# Patient Record
Sex: Female | Born: 1955
Health system: Southern US, Community
[De-identification: ages and names within clinical notes are randomized; demographics above are authoritative.]

## PROBLEM LIST (undated history)

## (undated) DIAGNOSIS — Z9889 Other specified postprocedural states: Secondary | ICD-10-CM

## (undated) DIAGNOSIS — F32A Depression, unspecified: Secondary | ICD-10-CM

## (undated) DIAGNOSIS — F419 Anxiety disorder, unspecified: Secondary | ICD-10-CM

## (undated) DIAGNOSIS — K219 Gastro-esophageal reflux disease without esophagitis: Secondary | ICD-10-CM

## (undated) DIAGNOSIS — M1990 Unspecified osteoarthritis, unspecified site: Secondary | ICD-10-CM

## (undated) DIAGNOSIS — R42 Dizziness and giddiness: Secondary | ICD-10-CM

## (undated) DIAGNOSIS — I10 Essential (primary) hypertension: Secondary | ICD-10-CM

## (undated) DIAGNOSIS — M1712 Unilateral primary osteoarthritis, left knee: Secondary | ICD-10-CM

## (undated) DIAGNOSIS — F321 Major depressive disorder, single episode, moderate: Secondary | ICD-10-CM

## (undated) DIAGNOSIS — F329 Major depressive disorder, single episode, unspecified: Secondary | ICD-10-CM

## (undated) DIAGNOSIS — M7731 Calcaneal spur, right foot: Secondary | ICD-10-CM

## (undated) DIAGNOSIS — R7303 Prediabetes: Secondary | ICD-10-CM

## (undated) DIAGNOSIS — E039 Hypothyroidism, unspecified: Secondary | ICD-10-CM

## (undated) DIAGNOSIS — E559 Vitamin D deficiency, unspecified: Secondary | ICD-10-CM

## (undated) DIAGNOSIS — E785 Hyperlipidemia, unspecified: Secondary | ICD-10-CM

## (undated) HISTORY — DX: Vitamin D deficiency, unspecified: E55.9

## (undated) HISTORY — PX: ABDOMINAL HYSTERECTOMY: SHX81

## (undated) HISTORY — DX: Depression, unspecified: F32.A

## (undated) HISTORY — PX: BLADDER SURGERY: SHX569

## (undated) HISTORY — PX: CHOLECYSTECTOMY: SHX55

## (undated) HISTORY — DX: Anxiety disorder, unspecified: F41.9

## (undated) HISTORY — PX: KNEE SURGERY: SHX244

## (undated) HISTORY — PX: APPENDECTOMY: SHX54

## (undated) HISTORY — DX: Major depressive disorder, single episode, unspecified: F32.9

## (undated) HISTORY — DX: Hyperlipidemia, unspecified: E78.5

## (undated) HISTORY — PX: TONSILLECTOMY: SHX5217

## (undated) HISTORY — PX: CARPAL TUNNEL RELEASE: SHX101

---

## 2002-04-21 DIAGNOSIS — E039 Hypothyroidism, unspecified: Secondary | ICD-10-CM | POA: Insufficient documentation

## 2002-06-02 DIAGNOSIS — K219 Gastro-esophageal reflux disease without esophagitis: Secondary | ICD-10-CM | POA: Insufficient documentation

## 2003-01-11 DIAGNOSIS — E7800 Pure hypercholesterolemia, unspecified: Secondary | ICD-10-CM | POA: Insufficient documentation

## 2004-07-22 ENCOUNTER — Ambulatory Visit: Payer: Self-pay

## 2004-07-22 DIAGNOSIS — M899 Disorder of bone, unspecified: Secondary | ICD-10-CM | POA: Insufficient documentation

## 2004-09-11 ENCOUNTER — Observation Stay: Payer: Self-pay | Admitting: Internal Medicine

## 2005-02-10 ENCOUNTER — Emergency Department: Payer: Self-pay | Admitting: Emergency Medicine

## 2005-04-09 ENCOUNTER — Ambulatory Visit: Payer: Self-pay

## 2005-11-27 ENCOUNTER — Ambulatory Visit: Payer: Self-pay

## 2007-06-22 ENCOUNTER — Other Ambulatory Visit: Payer: Self-pay

## 2007-06-22 ENCOUNTER — Emergency Department: Payer: Self-pay | Admitting: Emergency Medicine

## 2007-08-15 ENCOUNTER — Ambulatory Visit: Payer: Self-pay | Admitting: General Surgery

## 2007-12-08 ENCOUNTER — Ambulatory Visit: Payer: Self-pay

## 2008-06-15 HISTORY — PX: RECONSTRUCTION OF NOSE: SHX2301

## 2008-07-12 DIAGNOSIS — Z90710 Acquired absence of both cervix and uterus: Secondary | ICD-10-CM | POA: Insufficient documentation

## 2008-12-22 DIAGNOSIS — R1013 Epigastric pain: Secondary | ICD-10-CM | POA: Insufficient documentation

## 2008-12-22 DIAGNOSIS — IMO0001 Reserved for inherently not codable concepts without codable children: Secondary | ICD-10-CM | POA: Insufficient documentation

## 2008-12-27 ENCOUNTER — Ambulatory Visit: Payer: Self-pay | Admitting: Family Medicine

## 2009-01-01 ENCOUNTER — Ambulatory Visit: Payer: Self-pay | Admitting: Family Medicine

## 2009-03-22 DIAGNOSIS — S40029A Contusion of unspecified upper arm, initial encounter: Secondary | ICD-10-CM | POA: Insufficient documentation

## 2009-07-14 ENCOUNTER — Ambulatory Visit: Payer: Self-pay | Admitting: Otolaryngology

## 2009-08-01 DIAGNOSIS — J309 Allergic rhinitis, unspecified: Secondary | ICD-10-CM | POA: Insufficient documentation

## 2009-08-27 DIAGNOSIS — L989 Disorder of the skin and subcutaneous tissue, unspecified: Secondary | ICD-10-CM | POA: Insufficient documentation

## 2010-09-29 ENCOUNTER — Ambulatory Visit: Payer: Self-pay

## 2010-10-01 ENCOUNTER — Ambulatory Visit: Payer: Self-pay | Admitting: Family Medicine

## 2011-04-13 ENCOUNTER — Ambulatory Visit: Payer: Self-pay | Admitting: Family Medicine

## 2011-10-12 ENCOUNTER — Ambulatory Visit: Payer: Self-pay | Admitting: Family Medicine

## 2012-09-28 ENCOUNTER — Ambulatory Visit: Payer: Self-pay | Admitting: Family Medicine

## 2013-04-10 ENCOUNTER — Ambulatory Visit: Payer: Self-pay | Admitting: Family Medicine

## 2014-01-22 LAB — CBC AND DIFFERENTIAL
HCT: 41 % (ref 36–46)
Hemoglobin: 13.8 g/dL (ref 12.0–16.0)
Platelets: 325 10*3/uL (ref 150–399)
WBC: 5.9 10^3/mL

## 2014-01-22 LAB — BASIC METABOLIC PANEL
BUN: 17 mg/dL (ref 4–21)
Creatinine: 0.6 mg/dL (ref 0.5–1.1)
Glucose: 93 mg/dL
Potassium: 4.8 mmol/L (ref 3.4–5.3)
Sodium: 140 mmol/L (ref 137–147)

## 2014-01-22 LAB — LIPID PANEL
Cholesterol: 168 mg/dL (ref 0–200)
HDL: 56 mg/dL (ref 35–70)
LDL Cholesterol: 95 mg/dL
Triglycerides: 84 mg/dL (ref 40–160)

## 2014-01-22 LAB — HEPATIC FUNCTION PANEL
ALT: 12 U/L (ref 7–35)
AST: 13 U/L (ref 13–35)

## 2014-03-30 LAB — TSH: TSH: 0.46 u[IU]/mL (ref 0.41–5.90)

## 2015-01-03 ENCOUNTER — Other Ambulatory Visit: Payer: Self-pay

## 2015-01-03 DIAGNOSIS — E559 Vitamin D deficiency, unspecified: Secondary | ICD-10-CM | POA: Insufficient documentation

## 2015-01-03 MED ORDER — VITAMIN D (ERGOCALCIFEROL) 1.25 MG (50000 UNIT) PO CAPS: 50000.0000 [IU] | ORAL_CAPSULE | ORAL | Status: DC

## 2015-02-19 ENCOUNTER — Other Ambulatory Visit: Payer: Self-pay | Admitting: Family Medicine

## 2015-02-19 ENCOUNTER — Other Ambulatory Visit: Payer: Self-pay

## 2015-02-19 DIAGNOSIS — Z780 Asymptomatic menopausal state: Secondary | ICD-10-CM

## 2015-02-19 MED ORDER — ESTRADIOL 1 MG PO TABS: 1.0000 mg | ORAL_TABLET | Freq: Every day | ORAL | Status: DC

## 2015-02-19 MED ORDER — LEVOTHYROXINE SODIUM 150 MCG PO TABS: 150.0000 ug | ORAL_TABLET | Freq: Every day | ORAL | Status: DC

## 2015-02-19 NOTE — Telephone Encounter (Signed)
Has an appointment in October with Tawanna Sat.  Thanks,   -Mickel Baas

## 2015-02-19 NOTE — Telephone Encounter (Signed)
Pt contacted office for refill request on the following medications: Estrace 1 mg to [facility] in [place]. Pt is scheduled with Tawanna Sat for her CPE in October. Pt was a Debbie pt.  Thanks TNP

## 2015-04-02 DIAGNOSIS — F321 Major depressive disorder, single episode, moderate: Secondary | ICD-10-CM | POA: Insufficient documentation

## 2015-04-02 DIAGNOSIS — F329 Major depressive disorder, single episode, unspecified: Secondary | ICD-10-CM | POA: Insufficient documentation

## 2015-04-02 DIAGNOSIS — N6009 Solitary cyst of unspecified breast: Secondary | ICD-10-CM | POA: Insufficient documentation

## 2015-04-02 DIAGNOSIS — F32A Depression, unspecified: Secondary | ICD-10-CM | POA: Insufficient documentation

## 2015-04-02 DIAGNOSIS — F419 Anxiety disorder, unspecified: Secondary | ICD-10-CM | POA: Insufficient documentation

## 2015-04-02 DIAGNOSIS — G4700 Insomnia, unspecified: Secondary | ICD-10-CM | POA: Insufficient documentation

## 2015-04-02 DIAGNOSIS — M533 Sacrococcygeal disorders, not elsewhere classified: Secondary | ICD-10-CM | POA: Insufficient documentation

## 2015-04-02 DIAGNOSIS — R002 Palpitations: Secondary | ICD-10-CM | POA: Insufficient documentation

## 2015-04-05 ENCOUNTER — Telehealth: Payer: Self-pay

## 2015-04-05 ENCOUNTER — Ambulatory Visit
Admission: RE | Admit: 2015-04-05 | Discharge: 2015-04-05 | Disposition: A | Discharge status: Home / Self Care | Payer: BLUE CROSS/BLUE SHIELD | Source: Ambulatory Visit | Attending: Physician Assistant | Admitting: Physician Assistant

## 2015-04-05 ENCOUNTER — Encounter: Payer: Self-pay | Admitting: Physician Assistant

## 2015-04-05 ENCOUNTER — Ambulatory Visit (INDEPENDENT_AMBULATORY_CARE_PROVIDER_SITE_OTHER): Payer: BLUE CROSS/BLUE SHIELD | Admitting: Physician Assistant

## 2015-04-05 VITALS — BP 120/78 | HR 64 | Temp 97.6°F | Resp 16 | Ht 59.0 in | Wt 201.2 lb

## 2015-04-05 DIAGNOSIS — Z1239 Encounter for other screening for malignant neoplasm of breast: Secondary | ICD-10-CM

## 2015-04-05 DIAGNOSIS — E559 Vitamin D deficiency, unspecified: Secondary | ICD-10-CM | POA: Diagnosis not present

## 2015-04-05 DIAGNOSIS — Z23 Encounter for immunization: Secondary | ICD-10-CM

## 2015-04-05 DIAGNOSIS — F419 Anxiety disorder, unspecified: Secondary | ICD-10-CM

## 2015-04-05 DIAGNOSIS — M25532 Pain in left wrist: Secondary | ICD-10-CM

## 2015-04-05 DIAGNOSIS — Z0000 Encounter for general adult medical examination without abnormal findings: Secondary | ICD-10-CM

## 2015-04-05 DIAGNOSIS — R635 Abnormal weight gain: Secondary | ICD-10-CM | POA: Diagnosis not present

## 2015-04-05 DIAGNOSIS — E7800 Pure hypercholesterolemia, unspecified: Secondary | ICD-10-CM

## 2015-04-05 DIAGNOSIS — F329 Major depressive disorder, single episode, unspecified: Secondary | ICD-10-CM

## 2015-04-05 DIAGNOSIS — E039 Hypothyroidism, unspecified: Secondary | ICD-10-CM | POA: Diagnosis not present

## 2015-04-05 DIAGNOSIS — Z780 Asymptomatic menopausal state: Secondary | ICD-10-CM

## 2015-04-05 DIAGNOSIS — Z8719 Personal history of other diseases of the digestive system: Secondary | ICD-10-CM | POA: Insufficient documentation

## 2015-04-05 DIAGNOSIS — F32A Depression, unspecified: Secondary | ICD-10-CM

## 2015-04-05 MED ORDER — VITAMIN D (ERGOCALCIFEROL) 1.25 MG (50000 UNIT) PO CAPS: 50000.0000 [IU] | ORAL_CAPSULE | ORAL | Status: DC

## 2015-04-05 MED ORDER — ALPRAZOLAM 0.5 MG PO TABS: 0.5000 mg | ORAL_TABLET | Freq: Two times a day (BID) | ORAL | Status: DC | PRN

## 2015-04-05 MED ORDER — ESTRADIOL 1 MG PO TABS: 1.0000 mg | ORAL_TABLET | Freq: Every day | ORAL | Status: DC

## 2015-04-05 MED ORDER — LEVOTHYROXINE SODIUM 150 MCG PO TABS: 150.0000 ug | ORAL_TABLET | Freq: Every day | ORAL | Status: DC

## 2015-04-05 NOTE — Progress Notes (Signed)
Patient: Tammy Bailey, Female    DOB: [birth year], 59 y.o.   MRN: [id number] Visit Date: 04/05/2015  Today's Provider: Mar Daring, PA-C   Chief Complaint  Patient presents with  . Annual Exam   Subjective:    Annual physical exam Tammy Bailey is a 59 y.o. female who presents today for health maintenance and complete physical. She feels well. She reports not exercising. She reports she is sleeping poorly, sleeps 6-7 hours but wakes 2-3 times at night.  She is status post hysterectomy approximately 30-40 years ago. She states that she had her uterus removed initially when she was in her late 34s secondary to endometriosis, then a year later she had one ovary removed in the year following that she had the second ovary removed. She has recently had a Pap smear in 2013 which was normal. Pelvic exam last year was normal with the exception of a cystocele. She states that she did have her bladder tacked a long time ago, she can't remember exactly when it was. She does report having some urinary stress incontinence and some urinary leakage when she has to use the restroom.  Most recent mammogram she cannot remember when it was but states that it was normal. Per reviewing records and all scripts her mammogram last documented was 04/10/2013 and reported normal. There is no family history of breast cancer.  She states she has had one colonoscopy but cannot remember the year. She states it was completely normal and was told to repeat in 10 years. Per review of records and all scripts her last colonoscopy on file was 08/15/2007. This was done by Dr. Jamal Collin.  She is still currently on hormone replacement therapy with estradiol 1 mg. This is controlling her postmenopausal symptoms. She has been on this for a while. As stated above she has underwent bilateral oophorectomy when she was in her 58s.  She also would like to have her lipids checked today. She does have history of having high  cholesterol. She is not currently taking anything for her cholesterol. She does not adhere to a healthy diet nor is she exercising at this time.  She also states that her depression and anxiety have been increasing recently. She equates this to recent weight gain. She states that she is a heavy she has ever been and is interested in weight loss. At this time she is not adhering to a healthy diet nor a calorie restricted diet. She is also not doing any physical activity. She was to have her thyroid level checked to make sure that her dose of levothyroxine is appropriate and that it is not a cause of her weight gain. She is not currently on any medication for her depression. She does take Xanax as needed for her anxiety.  She also complains today of left wrist pain. She states that over a year ago she was helping her sister move a couch and the couch fell and twisted her wrist. She states that she didn't do any treatment for it nor did she seek medical attention. Since then she has had increasing pain in the wrist and with certain movements including radial deviation she feels a burning sensation that radiates up the forearm. She would like to make sure that she did not have a fracture back then or to make sure that there is nothing else going on. She is not currently taking any medication for this. She does state she will occasionally take  BC powder when it is bothering her.   Review of Systems  Constitutional: Positive for fatigue and unexpected weight change (weight gain). Negative for fever and chills.       Irritability  HENT: Negative for congestion, ear pain, postnasal drip, rhinorrhea, sinus pressure, sneezing and sore throat.   Eyes: Positive for redness (right eye allergies) and itching (right eye allergies).  Respiratory: Positive for chest tightness and shortness of breath. Negative for cough, choking and wheezing.   Cardiovascular: Positive for palpitations (chronic; when anxiety kicks in).  Negative for chest pain and leg swelling.  Gastrointestinal: Negative.   Endocrine: Negative.   Genitourinary: Positive for enuresis (stress). Negative for dysuria, urgency, frequency, hematuria, flank pain, decreased urine volume, vaginal bleeding, vaginal discharge, difficulty urinating, vaginal pain and pelvic pain.  Musculoskeletal: Positive for arthralgias (left wrist). Negative for myalgias, back pain, joint swelling, gait problem, neck pain and neck stiffness.  Skin: Negative.   Allergic/Immunologic: Negative.   Neurological: Positive for dizziness and headaches. Negative for tremors, syncope, weakness and numbness.  Hematological: Negative.   Psychiatric/Behavioral: Positive for sleep disturbance and decreased concentration. The patient is nervous/anxious.     Social History She  reports that she has never smoked. She does not have any smokeless tobacco history on file. She reports that she does not drink alcohol or use illicit drugs. Social History   Social History  . Marital Status: Divorced    Spouse Name: N/A  . Number of Children: N/A  . Years of Education: N/A   Social History Main Topics  . Smoking status: Never Smoker   . Smokeless tobacco: None  . Alcohol Use: No  . Drug Use: No  . Sexual Activity: Not Asked   Other Topics Concern  . None   Social History Narrative    Patient Active Problem List   Diagnosis Date Noted  . History of esophageal stricture 04/05/2015  . Anxiety 04/02/2015  . Solitary cyst of breast 04/02/2015  . Clinical depression 04/02/2015  . Cannot sleep 04/02/2015  . Depression, major, single episode, moderate ([place]) 04/02/2015  . Awareness of heartbeats 04/02/2015  . Post-menopausal 02/19/2015  . Vitamin D deficiency 01/03/2015  . Dermatologic disease 08/27/2009  . Allergic rhinitis 08/01/2009  . H/O total hysterectomy 07/12/2008  . Hypercholesterolemia without hypertriglyceridemia 01/11/2003  . Acid reflux 06/02/2002  . Adult  hypothyroidism 04/21/2002    Past Surgical History  Procedure Laterality Date  . Bladder surgery    . Appendectomy    . Cholecystectomy    . Abdominal hysterectomy    . Tonsillectomy    . Carpal tunnel release      Both  . Knee surgery Left x's two    Family History  Family Status  Relation Status Death Age  . Mother Alive   . Father Alive     MI  . Sister Alive   . Brother Alive   . Paternal Grandmother Deceased 25    Coronary artery disease; lymphoma  . Sister Alive    Her family history includes Diabetes in her sister; Hypertension in her father.    Allergies  Allergen Reactions  . Erythromycin     Previous Medications   No medications on file    Patient Care Team: Mar Daring, PA-C as PCP - General (Family Medicine)     Objective:   Vitals: BP 120/78 mmHg  Pulse 64  Temp(Src) 97.6 F (36.4 C) (Oral)  Resp 16  Ht 4\' 11"  (1.499 m)  Wt  201 lb 3.2 oz (91.264 kg)  BMI 40.62 kg/m2   Physical Exam  Constitutional: She is oriented to person, place, and time. She appears well-developed and well-nourished. No distress.  HENT:  Head: Normocephalic and atraumatic.  Right Ear: Hearing, tympanic membrane, external ear and ear canal normal.  Left Ear: Hearing, tympanic membrane, external ear and ear canal normal.  Nose: Nose normal.  Mouth/Throat: Uvula is midline, oropharynx is clear and moist and mucous membranes are normal. No oropharyngeal exudate.  Eyes: EOM are normal. Pupils are equal, round, and reactive to light. Right eye exhibits no discharge. Left eye exhibits no discharge. Right conjunctiva is injected (with itching; most likely secondary to allergies). Right conjunctiva has no hemorrhage. Left conjunctiva is not injected. Left conjunctiva has no hemorrhage. No scleral icterus.  Neck: Trachea normal and normal range of motion. Neck supple. No JVD present. Carotid bruit is not present. No tracheal deviation present. No thyroid mass and no  thyromegaly present.  Cardiovascular: Normal rate, regular rhythm, normal heart sounds and intact distal pulses.  Exam reveals no gallop and no friction rub.   No murmur heard. Pulmonary/Chest: Effort normal and breath sounds normal. No respiratory distress. She has no wheezes. She has no rales. She exhibits no tenderness. Right breast exhibits no inverted nipple, no mass, no nipple discharge, no skin change and no tenderness. Left breast exhibits no inverted nipple, no mass, no nipple discharge, no skin change and no tenderness. Breasts are symmetrical.  Abdominal: Soft. Bowel sounds are normal. She exhibits no distension and no mass. There is no tenderness. There is no rebound and no guarding. Hernia confirmed negative in the right inguinal area and confirmed negative in the left inguinal area.  Genitourinary: Rectum normal and vagina normal. No breast swelling, tenderness, discharge or bleeding. Pelvic exam was performed with patient supine. There is no rash, tenderness, lesion or injury on the right labia. There is no rash, tenderness, lesion or injury on the left labia. No erythema, tenderness or bleeding in the vagina. No foreign body around the vagina. No signs of injury around the vagina. No vaginal discharge found.  Uterus and ovaries surgically removed.  Small cystocele and rectocele on exam.  Musculoskeletal: Normal range of motion. She exhibits no edema or tenderness.  Lymphadenopathy:    She has no cervical adenopathy.       Right: No inguinal adenopathy present.       Left: No inguinal adenopathy present.  Neurological: She is alert and oriented to person, place, and time. She has normal reflexes. No cranial nerve deficit. Coordination normal.  Skin: Skin is warm and dry. No rash noted. She is not diaphoretic.  Psychiatric: She has a normal mood and affect. Her behavior is normal. Judgment and thought content normal.  Vitals reviewed.    Depression Screen No flowsheet data  found.    Assessment & Plan:     Routine Health Maintenance and Physical Exam  1. Annual physical exam Exam was normal today. We'll check labs and follow-up pending lab results. I will also see her back in 4 weeks to further discuss some of the acute issues that she brought up today. - CBC with Differential - Comprehensive metabolic panel - Mammogram Digital Screening; Future  2. Breast cancer screening Breast exam today was normal. Last mammogram was 04/10/2013 and normal. Order placed for screening mammogram. [facility] phone number was given to patient so she may call and schedule this appointment. - Mammogram Digital Screening; Future  3. Need for influenza vaccination Flu vaccine was given today without complication. - Flu Vaccine QUAD 36+ mos IM  4. Hypercholesterolemia without hypertriglyceridemia History of this. She is not currently taking any cholesterol-lowering medication. She does not adhere to a healthy diet nor is she doing any physical activity. I will check a lipid panel today and follow-up pending results. - Lipid panel  5. Vitamin D deficiency History of this and has been stable on vitamin D supplement as below. Vitamin D was refilled and we will check this lab to make sure she still requires a high-dose replacement of vitamin D. I will follow-up with her pending lab results - Vitamin D (25 hydroxy) - Vitamin D, Ergocalciferol, (DRISDOL) 50000 UNITS CAPS capsule; Take 1 capsule (50,000 Units total) by mouth every 7 (seven) days.  Dispense: 4 capsule; Refill: 6  6. Post-menopausal Currently stable on Estrace 1 mg. Medication was pulled as below for refill. - estradiol (ESTRACE) 1 MG tablet; Take 1 tablet (1 mg total) by mouth daily.  Dispense: 30 tablet; Refill: 6  7. Hypothyroidism, unspecified hypothyroidism type Has been stable on Synthroid 150 g. I will check her thyroid level today. Follow-up pending lab results. - TSH - levothyroxine  (SYNTHROID, LEVOTHROID) 150 MCG tablet; Take 1 tablet (150 mcg total) by mouth daily.  Dispense: 30 tablet; Refill: 6  8. Acute anxiety She states this has been worsening recently. She equates the increased anxiety and depression due to weight gain. She has not been making any lifestyle changes at this time to help her lose weight. She does state she will try to eat salads and salmon but she has been putting high fat and high calorie items on the salad. She is going to try to make changes so that she can hopefully start trying to lose weight. I did refill her Xanax as below. I will follow-up with her in 4 weeks to see how she is doing. - ALPRAZolam (XANAX) 0.5 MG tablet; Take 1 tablet (0.5 mg total) by mouth 2 (two) times daily as needed for anxiety.  Dispense: 60 tablet; Refill: 1  9. Left wrist pain Left wrist pain started after helping her sister. Tammy Bailey which they dropped and she twisted her wrist. This was over a year ago. She did not seek medical attention at that time. Since the accident she has been having increased tenderness with certain motions of the wrist especially radial deviation. She states that when she makes those motions she has a burning fire type sensation that shoots up her forearm. She does not have any decreased grip strength at this time. She also has normal sensation. - DG Hand Complete Left; Future - DG Wrist Complete Left; Future  10. Clinical depression This has been worsening. As stated above with anxiety she feels that the depression is increasing due to her weight gain. She states she is heavy she is ever been. She is currently not on any medications for her depression. I will follow-up with her in 4 weeks to see how she is doing with adding diet and exercise to her daily routine.  11. Weight gain As stated above she feels that her weight gain has contributed to worsening depression and anxiety. She states she is the heaviest she has ever been. She has not really adhere  to a healthy diet. She states she has been trying to increase eating salads and salmon but she was putting high-calorie high-fat ingredients on the salad as well. I gave her some information today  about different diets that she may try and we also discussed in detail about limiting calories. She stated that she does not do well with counting and does not know if that would work for her not. I also advised her to increase her physical activity as currently she is doing nothing. If she is able to increase physical activity and start adhering to a stricter diet I will consider adding an appetite suppressant to help her with her weight loss journey. I will see her back in 4 weeks to see if she has been trying to add exercise and watch her diet better. If so we may consider phentermine at that time.   Exercise Activities and Dietary recommendations Goals    None      Immunization History  Administered Date(s) Administered  . Influenza,inj,Quad PF,36+ Mos 04/05/2015  . Td 04/03/2004  . Tdap 08/27/2009  . Zoster 05/19/2013    Health Maintenance  Topic Date Due  . Hepatitis C Screening  10/21/1955  . HIV Screening  08/28/1969  . PAP SMEAR  09/15/2014  . INFLUENZA VACCINE  01/14/2015  . MAMMOGRAM  04/11/2015  . COLONOSCOPY  08/14/2017  . TETANUS/TDAP  08/28/2019  . ZOSTAVAX  Completed      Discussed health benefits of physical activity, and encouraged her to engage in regular exercise appropriate for her age and condition.    --------------------------------------------------------------------

## 2015-04-05 NOTE — Telephone Encounter (Signed)
Patient advised as directed below.  Thanks,  -Baudelio Karnes 

## 2015-04-05 NOTE — Patient Instructions (Addendum)
Health Maintenance, Female Adopting a healthy lifestyle and getting preventive care can go a long way to promote health and wellness. Talk with your health care provider about what schedule of regular examinations is right for you. This is a good chance for you to check in with your provider about disease prevention and staying healthy. In between checkups, there are plenty of things you can do on your own. Experts have done a lot of research about which lifestyle changes and preventive measures are most likely to keep you healthy. Ask your health care provider for more information. WEIGHT AND DIET  Eat a healthy diet 1. Be sure to include plenty of vegetables, fruits, low-fat dairy products, and lean protein. 2. Do not eat a lot of foods high in solid fats, added sugars, or salt. 3. Get regular exercise. This is one of the most important things you can do for your health. 1. Most adults should exercise for at least 150 minutes each week. The exercise should increase your heart rate and make you sweat (moderate-intensity exercise). 2. Most adults should also do strengthening exercises at least twice a week. This is in addition to the moderate-intensity exercise.  Maintain a healthy weight 1. Body mass index (BMI) is a measurement that can be used to identify possible weight problems. It estimates body fat based on height and weight. Your health care provider can help determine your BMI and help you achieve or maintain a healthy weight. 2. For females 60 years of age and older:  1. A BMI below 18.5 is considered underweight. 2. A BMI of 18.5 to 24.9 is normal. 3. A BMI of 25 to 29.9 is considered overweight. 4. A BMI of 30 and above is considered obese.  Watch levels of cholesterol and blood lipids 1. You should start having your blood tested for lipids and cholesterol at 60 years of age, then have this test every 5 years. 2. You may need to have your cholesterol levels checked more often  if: 1. Your lipid or cholesterol levels are high. 2. You are older than 60 years of age. 3. You are at high risk for heart disease. your cholesterol levels checked more often  if: 1. Your lipid or cholesterol levels are high. 2. You are older than 60 years of age. 3. You are at high risk for heart disease.  CANCER SCREENING   Lung Cancer 1. Lung cancer screening is recommended for adults 60-65 years old who are at high risk for lung cancer because of a history of smoking. 2. A yearly low-dose CT scan of the lungs is recommended for people who: 1. Currently smoke. 2. Have quit within the past 15 years. 3. Have at least a 30-pack-year history of smoking. A pack year is smoking an average of one pack of cigarettes a day for 1 year. 3. Yearly screening should continue until it has been 15 years since you quit. 4. Yearly screening should stop if you develop a health problem that would prevent you from having lung cancer treatment.  Breast Cancer  Practice breast self-awareness. This means understanding how your breasts normally appear and feel.  It also means doing regular breast self-exams. Let your health care provider know about any changes, no matter how small.  If you are in your 20s or 30s, you should have a clinical breast exam (CBE) by a health care provider every 1-3 years as part of a regular health exam.  If you are 60 or older, have a CBE every year. Also consider having a breast X-ray (mammogram) every year.  If you have a family history of breast cancer, talk to your health care provider about genetic screening.  If you  are at high risk for breast cancer, talk to your health care provider about having an MRI and a mammogram every year.  Breast cancer gene (BRCA) assessment is recommended for women who have family members with BRCA-related cancers. BRCA-related cancers include:  Breast.  Ovarian.  Tubal.  Peritoneal cancers.  Results of the assessment will determine the need for genetic counseling and BRCA1 and BRCA2 testing. Cervical Cancer Your health care provider may recommend that you be screened regularly for cancer of the pelvic  organs (ovaries, uterus, and vagina). This screening involves a pelvic examination, including checking for microscopic changes to the surface of your cervix (Pap test). You may be encouraged to have this screening done every 3 years, beginning at age 60.  For women ages 60-65, health care providers may recommend pelvic exams and Pap testing every 3 years, or they may recommend the Pap and pelvic exam, combined with testing for human papilloma virus (HPV), every 5 years. Some types of HPV increase your risk of cervical cancer. Testing for HPV may also be done on women of any age with unclear Pap test results.  Other health care providers may not recommend any screening for nonpregnant women who are considered low risk for pelvic cancer and who do not have symptoms. Ask your health care provider if a screening pelvic exam is right for you.  If you have had past treatment for cervical cancer or a condition that could lead to cancer, you need Pap tests and screening for cancer for at least 60 years after your treatment. or a condition that could lead to cancer, you need Pap tests and screening for cancer for at least 20 years after your treatment. If Pap tests have been discontinued, your risk factors (such as having a new sexual partner) need to be reassessed to determine if screening should resume. Some women have medical problems that increase the chance of getting cervical cancer. In these cases, your health care provider may recommend more frequent screening and Pap tests. Colorectal Cancer  This type of cancer can be detected and often prevented.  Routine colorectal cancer screening usually begins at 60 years of age and continues through 60 years of age.  Your health care provider may recommend screening at an earlier age if you have risk factors for colon cancer.  Your health care provider may also recommend using home test kits to check for hidden blood in the stool.  A small camera at the end of a tube can be used to examine your colon directly (sigmoidoscopy or colonoscopy). This is done to check for the earliest forms  of colorectal cancer.  Routine screening usually begins at age 60.  Direct examination of the colon should be repeated every 5-10 years through 60 years of age. However, you may need to be screened more often if early forms of precancerous polyps or small growths are found. Skin Cancer  Check your skin from head to toe regularly.  Tell your health care provider about any new moles or changes in moles, especially if there is a change in a mole's shape or color.  Also tell your health care provider if you have a mole that is larger than the size of a pencil eraser.  Always use sunscreen. Apply sunscreen liberally and repeatedly throughout the day.  Protect yourself by wearing long sleeves, pants, a wide-brimmed hat, and sunglasses whenever you are outside. HEART DISEASE, DIABETES, AND HIGH BLOOD PRESSURE   High blood pressure causes heart disease and increases the risk of stroke. High blood pressure is more likely to develop in:  People who have blood pressure in the high end  of the normal range (130-139/85-89 mm Hg).  People who are overweight or obese.  People who are African American.  If you are 85-42 years of age, have your blood pressure checked every 3-5 years. If you are 20 years of age or older, have your blood pressure checked every year. You should have your blood pressure measured twice--once when you are at a hospital or clinic, and once when you are not at a hospital or clinic. Record the average of the two measurements. To check your blood pressure when you are not at a hospital or clinic, you can use:  An automated blood pressure machine at a pharmacy.  A home blood pressure monitor.  If you are between 19 years and 53 years old, ask your health care provider if you should take aspirin to prevent strokes.  Have regular diabetes screenings. This involves taking a blood sample to check your fasting blood sugar level.  If you are at a normal weight and have a low risk  for diabetes, have this test once every three years after 61 years of age.  If you are overweight and have a high risk for diabetes, consider being tested at a younger age or more often. PREVENTING INFECTION  Hepatitis B  If you have a higher risk for hepatitis B, you should be screened for this virus. You are considered at high risk for hepatitis B if:  You were born in a country where hepatitis B is common. Ask your health care provider which countries are considered high risk.  Your parents were born in a high-risk country, and you have not been immunized against hepatitis B (hepatitis B vaccine).  You have HIV or AIDS.  You use needles to inject street drugs.  You live with someone who has hepatitis B.  You have had sex with someone who has hepatitis B.  You get hemodialysis treatment.  You take certain medicines for conditions, including cancer, organ transplantation, and autoimmune conditions. Hepatitis C  Blood testing is recommended for:  Everyone born from [birth year] through 1966.  Anyone with known risk factors for hepatitis C. Sexually transmitted infections (STIs)  You should be screened for sexually transmitted infections (STIs) including gonorrhea and chlamydia if:  You are sexually active and are younger than 60 years of age.  You are older than 61 years of age and your health care provider tells you that you are at risk for this type of infection.  Your sexual activity has changed since you were last screened and you are at an increased risk for chlamydia or gonorrhea. Ask your health care provider if you are at risk.  If you do not have HIV, but are at risk, it may be recommended that you take a prescription medicine daily to prevent HIV infection. This is called pre-exposure prophylaxis (PrEP). You are considered at risk if:  You are sexually active and do not regularly use condoms or know the HIV status of your partner(s).  You take drugs by injection.  You  are sexually active with a partner who has HIV. Talk with your health care provider about whether you are at high risk of being infected with HIV. If you choose to begin PrEP, you should first be tested for HIV. You should then be tested every 3 months for as long as you are taking PrEP.  PREGNANCY   If you are premenopausal and you may become pregnant, ask your health care provider about preconception counseling.  If you may  become pregnant, take 400 to 800 micrograms (mcg) of folic acid every day.  If you want to prevent pregnancy, talk to your health care provider about birth control (contraception). OSTEOPOROSIS AND MENOPAUSE   Osteoporosis is a disease in which the bones lose minerals and strength with aging. This can result in serious bone fractures. Your risk for osteoporosis can be identified using a bone density scan.  If you are 29 years of age or older, or if you are at risk for osteoporosis and fractures, ask your health care provider if you should be screened.  Ask your health care provider whether you should take a calcium or vitamin D supplement to lower your risk for osteoporosis.  Menopause may have certain physical symptoms and risks.  Hormone replacement therapy may reduce some of these symptoms and risks. Talk to your health care provider about whether hormone replacement therapy is right for you.  HOME CARE INSTRUCTIONS   Schedule regular health, dental, and eye exams.  Stay current with your immunizations.   Do not use any tobacco products including cigarettes, chewing tobacco, or electronic cigarettes.  If you are pregnant, do not drink alcohol.  If you are breastfeeding, limit how much and how often you drink alcohol.  Limit alcohol intake to no more than 1 drink per day for nonpregnant women. One drink equals 12 ounces of beer, 5 ounces of wine, or 1 ounces of hard liquor.  Do not use street drugs.  Do not share needles.  Ask your health care provider  for help if you need support or information about quitting drugs.  Tell your health care provider if you often feel depressed.  Tell your health care provider if you have ever been abused or do not feel safe at home.   This information is not intended to replace advice given to you by your health care provider. Make sure you discuss any questions you have with your health care provider.   Document Released: 12/15/2010 Document Revised: 06/22/2014 Document Reviewed: 05/03/2013 Elsevier Interactive Patient Education 2016 [place] [facility]) Exercise Recommendation  Being physically active is important to prevent heart disease and stroke, the nation's No. 1and No. 5killers. To improve overall cardiovascular health, we suggest at least 150 minutes per week of moderate exercise or 75 minutes per week of vigorous exercise (or a combination of moderate and vigorous activity). Thirty minutes a day, five times a week is an easy goal to remember. You will also experience benefits even if you divide your time into two or three segments of 10 to 15 minutes per day.  For people who would benefit from lowering their blood pressure or cholesterol, we recommend 40 minutes of aerobic exercise of moderate to vigorous intensity three to four times a week to lower the risk for heart attack and stroke.  Physical activity is anything that makes you move your body and burn calories.  This includes things like climbing stairs or playing sports. Aerobic exercises benefit your heart, and include walking, jogging, swimming or biking. Strength and stretching exercises are best for overall stamina and flexibility.  The simplest, positive change you can make to effectively improve your heart health is to start walking. It's enjoyable, free, easy, social and great exercise. A walking program is flexible and boasts high success rates because people can stick with it. It's easy for walking to become  a regular and satisfying part of life.   For Overall Cardiovascular Health:  At least 30  minutes of moderate-intensity aerobic activity at least 5 days per week for a total of 150  OR   At least 25 minutes of vigorous aerobic activity at least 3 days per week for a total of 75 minutes; or a combination of moderate- and vigorous-intensity aerobic activity  AND   Moderate- to high-intensity muscle-strengthening activity at least 2 days per week for additional health benefits.  For Lowering Blood Pressure and Cholesterol  An average 40 minutes of moderate- to vigorous-intensity aerobic activity 3 or 4 times per week  What if I can't make it to the time goal? Something is always better than nothing! And everyone has to start somewhere. Even if you've been sedentary for years, today is the day you can begin to make healthy changes in your life. If you don't think you'll make it for 30 or 40 minutes, set a reachable goal for today. You can work up toward your overall goal by increasing your time as you get stronger. Don't let all-or-nothing thinking rob you of doing what you can every day.  Source:http://www.heart.org      Why follow it? Research shows. . Those who follow the Mediterranean diet have a reduced risk of heart disease  . The diet is associated with a reduced incidence of Parkinson's and Alzheimer's diseases . People following the diet may have longer life expectancies and lower rates of chronic diseases  . The Dietary Guidelines for Americans recommends the Mediterranean diet as an eating plan to promote health and prevent disease  What Is the Mediterranean Diet?  . Healthy eating plan based on typical foods and recipes of Mediterranean-style cooking . The diet is primarily a plant based diet; these foods should make up a majority of meals   Starches - Plant based foods should make up a majority of meals - They are an important sources of vitamins, minerals, energy,  antioxidants, and fiber - Choose whole grains, foods high in fiber and minimally processed items  - Typical grain sources include wheat, oats, barley, corn, brown rice, bulgar, farro, millet, polenta, couscous  - Various types of beans include chickpeas, lentils, fava beans, black beans, white beans   Fruits  Veggies - Large quantities of antioxidant rich fruits & veggies; 6 or more servings  - Vegetables can be eaten raw or lightly drizzled with oil and cooked  - Vegetables common to the traditional Mediterranean Diet include: artichokes, arugula, beets, broccoli, brussel sprouts, cabbage, carrots, celery, collard greens, cucumbers, eggplant, kale, leeks, lemons, lettuce, mushrooms, okra, onions, peas, peppers, potatoes, pumpkin, radishes, rutabaga, shallots, spinach, sweet potatoes, turnips, zucchini - Fruits common to the Mediterranean Diet include: apples, apricots, avocados, cherries, clementines, dates, figs, grapefruits, grapes, melons, nectarines, oranges, peaches, pears, pomegranates, strawberries, tangerines  Fats - Replace butter and margarine with healthy oils, such as olive oil, canola oil, and tahini  - Limit nuts to no more than a handful a day  - Nuts include walnuts, almonds, pecans, pistachios, pine nuts  - Limit or avoid candied, honey roasted or heavily salted nuts - Olives are central to the [employer] - can be eaten whole or used in a variety of dishes   Meats Protein - Limiting red meat: no more than a few times a month - When eating red meat: choose lean cuts and keep the portion to the size of deck of cards - Eggs: approx. 0 to 4 times a week  - Fish and lean poultry: at least 2 a week  - Healthy  protein sources include, chicken, [place], lean beef, lamb - Increase intake of seafood such as tuna, salmon, trout, mackerel, shrimp, scallops - Avoid or limit high fat processed meats such as sausage and bacon  Dairy - Include moderate amounts of low fat dairy products   - Focus on healthy dairy such as fat free yogurt, skim milk, low or reduced fat cheese - Limit dairy products higher in fat such as whole or 2% milk, cheese, ice cream  Alcohol - Moderate amounts of red wine is ok  - No more than 5 oz daily for women (all ages) and men older than age 85  - No more than 10 oz of wine daily for men younger than 28  Other - Limit sweets and other desserts  - Use herbs and spices instead of salt to flavor foods  - Herbs and spices common to the traditional Mediterranean Diet include: basil, bay leaves, chives, cloves, cumin, fennel, garlic, lavender, marjoram, mint, oregano, parsley, pepper, rosemary, sage, savory, sumac, tarragon, thyme   It's not just a diet, it's a lifestyle:  . The Mediterranean diet includes lifestyle factors typical of those in the region  . Foods, drinks and meals are best eaten with others and savored . Daily physical activity is important for overall good health . This could be strenuous exercise like running and aerobics . This could also be more leisurely activities such as walking, housework, yard-work, or taking the stairs . Moderation is the key; a balanced and healthy diet accommodates most foods and drinks . Consider portion sizes and frequency of consumption of certain foods   Meal Ideas & Options:  . Breakfast:  o Whole wheat toast or whole wheat English muffins with peanut butter & hard boiled egg o Steel cut oats topped with apples & cinnamon and skim milk  o Fresh fruit: banana, strawberries, melon, berries, peaches  o Smoothies: strawberries, bananas, greek yogurt, peanut butter o Low fat greek yogurt with blueberries and granola  o Egg white omelet with spinach and mushrooms o Breakfast couscous: whole wheat couscous, apricots, skim milk, cranberries  . Sandwiches:  o Hummus and grilled vegetables (peppers, zucchini, squash) on whole wheat bread   o Grilled chicken on whole wheat pita with lettuce, tomatoes,  cucumbers or tzatziki  o Tuna salad on whole wheat bread: tuna salad made with greek yogurt, olives, red peppers, capers, green onions o Garlic rosemary lamb pita: lamb sauted with garlic, rosemary, salt & pepper; add lettuce, cucumber, greek yogurt to pita - flavor with lemon juice and black pepper  . Seafood:  o Mediterranean grilled salmon, seasoned with garlic, basil, parsley, lemon juice and black pepper o Shrimp, lemon, and spinach whole-grain pasta salad made with low fat greek yogurt  o Seared scallops with lemon orzo  o Seared tuna steaks seasoned salt, pepper, coriander topped with tomato mixture of olives, tomatoes, olive oil, minced garlic, parsley, green onions and cappers  . Meats:  o Herbed greek chicken salad with kalamata olives, cucumber, feta  o Red bell peppers stuffed with spinach, bulgur, lean ground beef (or lentils) & topped with feta   o Kebabs: skewers of chicken, tomatoes, onions, zucchini, squash  o [place] burgers: made with red onions, mint, dill, lemon juice, feta cheese topped with roasted red peppers . Vegetarian o Cucumber salad: cucumbers, artichoke hearts, celery, red onion, feta cheese, tossed in olive oil & lemon juice  o Hummus and whole grain pita points with a greek salad (lettuce, tomato, feta,  olives, cucumbers, red onion) o Lentil soup with celery, carrots made with vegetable broth, garlic, salt and pepper  o Tabouli salad: parsley, bulgur, mint, scallions, cucumbers, tomato, radishes, lemon juice, olive oil, salt and pepper.

## 2015-04-05 NOTE — Telephone Encounter (Signed)
-----   Message from Mar Daring, [place] sent at 04/05/2015 11:36 AM EDT ----- Xrays show degenerative changes.  No fractures.  This is most likely arthritis that has advanced secondary to the injury over a year ago.  We can discuss in more detail at follow up treatment options for arthritis.  Most often, however, we treat as needed with anti-inflammatories such as aleve or ibuprofen.

## 2015-04-06 LAB — CBC WITH DIFFERENTIAL/PLATELET
Basophils Absolute: 0 10*3/uL (ref 0.0–0.2)
Basos: 1 %
EOS (ABSOLUTE): 0.2 10*3/uL (ref 0.0–0.4)
Eos: 3 %
Hematocrit: 41.9 % (ref 34.0–46.6)
Hemoglobin: 14.4 g/dL (ref 11.1–15.9)
Immature Grans (Abs): 0 10*3/uL (ref 0.0–0.1)
Immature Granulocytes: 0 %
Lymphocytes Absolute: 1.7 10*3/uL (ref 0.7–3.1)
Lymphs: 32 %
MCH: 30 pg (ref 26.6–33.0)
MCHC: 34.4 g/dL (ref 31.5–35.7)
MCV: 87 fL (ref 79–97)
Monocytes Absolute: 0.6 10*3/uL (ref 0.1–0.9)
Monocytes: 11 %
Neutrophils Absolute: 2.9 10*3/uL (ref 1.4–7.0)
Neutrophils: 53 %
Platelets: 337 10*3/uL (ref 150–379)
RBC: 4.8 x10E6/uL (ref 3.77–5.28)
RDW: 13.4 % (ref 12.3–15.4)
WBC: 5.3 10*3/uL (ref 3.4–10.8)

## 2015-04-06 LAB — COMPREHENSIVE METABOLIC PANEL
ALT: 12 IU/L (ref 0–32)
AST: 13 IU/L (ref 0–40)
Albumin/Globulin Ratio: 1.8 (ref 1.1–2.5)
Albumin: 4.4 g/dL (ref 3.6–4.8)
Alkaline Phosphatase: 99 IU/L (ref 39–117)
BUN/Creatinine Ratio: 22 (ref 11–26)
BUN: 14 mg/dL (ref 8–27)
Bilirubin Total: 0.2 mg/dL (ref 0.0–1.2)
CO2: 23 mmol/L (ref 18–29)
Calcium: 9.4 mg/dL (ref 8.7–10.3)
Chloride: 104 mmol/L (ref 97–106)
Creatinine, Ser: 0.65 mg/dL (ref 0.57–1.00)
GFR calc Af Amer: 112 mL/min/{1.73_m2} (ref >59)
GFR calc non Af Amer: 97 mL/min/{1.73_m2} (ref >59)
Globulin, Total: 2.5 g/dL (ref 1.5–4.5)
Glucose: 91 mg/dL (ref 65–99)
Potassium: 4.7 mmol/L (ref 3.5–5.2)
Sodium: 140 mmol/L (ref 136–144)
Total Protein: 6.9 g/dL (ref 6.0–8.5)

## 2015-04-06 LAB — LIPID PANEL
Chol/HDL Ratio: 2.8 ratio units (ref 0.0–4.4)
Cholesterol, Total: 197 mg/dL (ref 100–199)
HDL: 71 mg/dL (ref >39)
LDL Calculated: 111 mg/dL — ABNORMAL HIGH (ref 0–99)
Triglycerides: 77 mg/dL (ref 0–149)
VLDL Cholesterol Cal: 15 mg/dL (ref 5–40)

## 2015-04-06 LAB — VITAMIN D 25 HYDROXY (VIT D DEFICIENCY, FRACTURES): Vit D, 25-Hydroxy: 24.1 ng/mL — ABNORMAL LOW (ref 30.0–100.0)

## 2015-04-06 LAB — TSH: TSH: 1.92 u[IU]/mL (ref 0.450–4.500)

## 2015-04-09 ENCOUNTER — Telehealth: Payer: Self-pay

## 2015-04-09 NOTE — Telephone Encounter (Signed)
Patient advised as directed below.  Thanks,  -Rudie Rikard 

## 2015-04-09 NOTE — Telephone Encounter (Signed)
-----   Message from Mar Daring, [place] sent at 04/06/2015  9:20 AM EDT ----- All labs are normal and stable with exception of vitamin d, which is still low.  Continue vit D supplement 50,000 IU q weekly as prescribed.

## 2015-05-03 ENCOUNTER — Ambulatory Visit: Payer: BLUE CROSS/BLUE SHIELD | Admitting: Physician Assistant

## 2015-08-07 ENCOUNTER — Telehealth: Payer: Self-pay | Admitting: Physician Assistant

## 2015-08-07 NOTE — Telephone Encounter (Signed)
Please advise 

## 2015-08-07 NOTE — Telephone Encounter (Signed)
Patient advised as directed below. Patient verbalized understanding.  

## 2015-08-07 NOTE — Telephone Encounter (Signed)
LMTCB

## 2015-08-07 NOTE — Telephone Encounter (Signed)
She should be able to take any of them. The key is if it has an antihistamine in it or not. Try to find one without an antihistamine. Also the other important thing is to not take it near the time she takes her synthroid. There should be a minimum of one hour between her synthroid and the sinus decongestant.

## 2015-08-07 NOTE — Telephone Encounter (Signed)
Pt would like to speak with a nurse. Pt stated that because she has problems with her thyroid she can only take certain medications over the counter. Pt would like to know what she can try for sinus congestion over the counter. Thanks TNP

## 2015-11-21 ENCOUNTER — Encounter: Payer: Self-pay | Admitting: Physician Assistant

## 2015-11-21 ENCOUNTER — Ambulatory Visit (INDEPENDENT_AMBULATORY_CARE_PROVIDER_SITE_OTHER): Payer: BLUE CROSS/BLUE SHIELD | Admitting: Physician Assistant

## 2015-11-21 VITALS — BP 120/70 | HR 74 | Temp 97.9°F | Resp 16 | Wt 197.2 lb

## 2015-11-21 DIAGNOSIS — K148 Other diseases of tongue: Secondary | ICD-10-CM | POA: Diagnosis not present

## 2015-11-21 NOTE — Progress Notes (Signed)
Patient: Tammy Bailey Female    DOB: 1955/11/03   60 y.o.   MRN: [id number] Visit Date: 11/21/2015  Today's Provider: Mar Daring, PA-C   No chief complaint on file.  Subjective:    HPI  Tammy Bailey is here today with c/o a dark lump in the back of her throat on the right side that she noticed yesterday 11/20/15. She reports that she feels discomfort when she turns her neck toward the right with downward flexion, "feel something there". She feels it when she swallows and it hurts sometimes. She had the feeling of a pop corn being stuck in her throat and went and looked in the mirror and noticed the area. No fever, nausea, vomiting, chills or shortness of breath. Denies bleeding, rash. She has never smoked or drank alcohol. No history of GERD.     Allergies  Allergen Reactions  . Erythromycin    Current Meds  Medication Sig  . ALPRAZolam (XANAX) 0.5 MG tablet Take 1 tablet (0.5 mg total) by mouth 2 (two) times daily as needed for anxiety.  Marland Kitchen estradiol (ESTRACE) 1 MG tablet Take 1 tablet (1 mg total) by mouth daily.  Marland Kitchen levothyroxine (SYNTHROID, LEVOTHROID) 150 MCG tablet Take 1 tablet (150 mcg total) by mouth daily.  . Vitamin D, Ergocalciferol, (DRISDOL) 50000 UNITS CAPS capsule Take 1 capsule (50,000 Units total) by mouth every 7 (seven) days.    Review of Systems  Constitutional: Negative for fever and fatigue.  HENT: Positive for mouth sores and trouble swallowing (feels something there). Negative for congestion, dental problem, ear discharge, ear pain, postnasal drip, sore throat and voice change.   Respiratory: Negative for cough, choking, chest tightness, shortness of breath, wheezing and stridor.   Cardiovascular: Negative for chest pain, palpitations and leg swelling.  Gastrointestinal: Negative for nausea, vomiting and abdominal pain.  Musculoskeletal: Negative for neck pain and neck stiffness.  Skin: Negative for rash.  Neurological: Negative for speech  difficulty and headaches.    Social History  Substance Use Topics  . Smoking status: Never Smoker   . Smokeless tobacco: Not on file  . Alcohol Use: No   Objective:   BP 120/70 mmHg  Pulse 74  Temp(Src) 97.9 F (36.6 C) (Oral)  Resp 16  Wt 197 lb 3.2 oz (89.449 kg)  Physical Exam  Constitutional: She appears well-developed and well-nourished. No distress.  HENT:  Mouth/Throat: Uvula is midline, oropharynx is clear and moist and mucous membranes are normal. Oral lesions present. Normal dentition. No dental abscesses, uvula swelling or dental caries. No oropharyngeal exudate, posterior oropharyngeal edema or posterior oropharyngeal erythema.    Neck: Normal range of motion. Neck supple.  Cardiovascular: Normal rate, regular rhythm and normal heart sounds.  Exam reveals no gallop and no friction rub.   No murmur heard. Pulmonary/Chest: Effort normal and breath sounds normal. No respiratory distress. She has no wheezes. She has no rales.  Skin: She is not diaphoretic.  Vitals reviewed.       Assessment & Plan:     1. Tongue lesion Unknown cause or source. Dennis Chrismon, PA-C also came in and agreed that it would be best for her to be evaluated by ENT. Will refer her back to Dr. Pryor Ochoa (has seen him before but many years ago) for furhter evaluation and possible treatment. She is to call the office if she develops a fever, worsening swallowing or pain, or SOB. She is to call 9-1-1 if SOB  is severe or she develops inability to swallow. She agrees.   - Ambulatory referral to ENT       Mar Daring, PA-C  [facility]

## 2015-11-21 NOTE — Patient Instructions (Signed)
Lesion of lingual tonsil on the right side.   Awaiting appointment with Dr. Pryor Ochoa.  Call if fever, chills, nausea or vomiting develop.  Call 9-1-1 if develop shortness of breath or inability to swallow

## 2016-01-14 ENCOUNTER — Other Ambulatory Visit: Payer: Self-pay

## 2016-01-14 DIAGNOSIS — E559 Vitamin D deficiency, unspecified: Secondary | ICD-10-CM

## 2016-01-14 DIAGNOSIS — Z780 Asymptomatic menopausal state: Secondary | ICD-10-CM

## 2016-01-14 MED ORDER — ESTRADIOL 1 MG PO TABS: 1.0000 mg | ORAL_TABLET | Freq: Every day | ORAL | 6 refills | Status: DC

## 2016-01-14 MED ORDER — VITAMIN D (ERGOCALCIFEROL) 1.25 MG (50000 UNIT) PO CAPS: 50000.0000 [IU] | ORAL_CAPSULE | ORAL | 6 refills | Status: DC

## 2016-01-14 MED ORDER — LEVOTHYROXINE SODIUM 150 MCG PO TABS: 150.0000 ug | ORAL_TABLET | Freq: Every day | ORAL | 6 refills | Status: DC

## 2016-04-07 ENCOUNTER — Encounter: Payer: Self-pay | Admitting: Physician Assistant

## 2016-04-07 ENCOUNTER — Ambulatory Visit (INDEPENDENT_AMBULATORY_CARE_PROVIDER_SITE_OTHER): Payer: BLUE CROSS/BLUE SHIELD | Admitting: Physician Assistant

## 2016-04-07 VITALS — BP 120/70 | HR 65 | Temp 97.7°F | Resp 16 | Ht 59.0 in | Wt 201.2 lb

## 2016-04-07 DIAGNOSIS — G4700 Insomnia, unspecified: Secondary | ICD-10-CM

## 2016-04-07 DIAGNOSIS — Z23 Encounter for immunization: Secondary | ICD-10-CM

## 2016-04-07 DIAGNOSIS — E559 Vitamin D deficiency, unspecified: Secondary | ICD-10-CM | POA: Diagnosis not present

## 2016-04-07 DIAGNOSIS — Z1231 Encounter for screening mammogram for malignant neoplasm of breast: Secondary | ICD-10-CM | POA: Diagnosis not present

## 2016-04-07 DIAGNOSIS — E7800 Pure hypercholesterolemia, unspecified: Secondary | ICD-10-CM

## 2016-04-07 DIAGNOSIS — F331 Major depressive disorder, recurrent, moderate: Secondary | ICD-10-CM

## 2016-04-07 DIAGNOSIS — Z0000 Encounter for general adult medical examination without abnormal findings: Secondary | ICD-10-CM | POA: Diagnosis not present

## 2016-04-07 DIAGNOSIS — E039 Hypothyroidism, unspecified: Secondary | ICD-10-CM

## 2016-04-07 DIAGNOSIS — Z1239 Encounter for other screening for malignant neoplasm of breast: Secondary | ICD-10-CM

## 2016-04-07 MED ORDER — ESCITALOPRAM OXALATE 10 MG PO TABS: ORAL_TABLET | ORAL | 1 refills | Status: DC

## 2016-04-07 NOTE — Progress Notes (Signed)
Patient: Tammy Bailey, Female    DOB: 1956-06-15, 60 y.o.   MRN: [id number] Visit Date: 04/07/2016  Today's Provider: Mar Daring, PA-C   Chief Complaint  Patient presents with  . Annual Exam   Subjective:    Annual physical exam Tammy Bailey is a 60 y.o. female who presents today for health maintenance and complete physical. She feels well. She reports not exercising. She reports she is sleeping poorly. States she may be in bed for approximately 7 hours but tosses and wakes often.  CPE-04/05/15 Mammo-04/10/13-BI-RADS 1 Colon-08/15/07- Normal-Dr.Sankar Pap-09/25/11-Negative -----------------------------------------------------------------   Review of Systems  Constitutional: Positive for fatigue.       Irritability  HENT: Positive for sinus pressure and sneezing.   Eyes: Positive for itching and visual disturbance.  Respiratory: Positive for shortness of breath.   Cardiovascular: Negative.   Gastrointestinal: Positive for constipation.  Endocrine: Negative.   Genitourinary: Negative.   Musculoskeletal: Positive for arthralgias.  Skin: Negative.   Allergic/Immunologic: Negative.   Neurological: Positive for headaches.  Hematological: Negative.   Psychiatric/Behavioral: Positive for decreased concentration and sleep disturbance. The patient is nervous/anxious.     Social History      She  reports that she has never smoked. She has never used smokeless tobacco. She reports that she does not drink alcohol or use drugs.       Social History   Social History  . Marital status: Divorced    Spouse name: N/A  . Number of children: N/A  . Years of education: N/A   Social History Main Topics  . Smoking status: Never Smoker  . Smokeless tobacco: Never Used  . Alcohol use No  . Drug use: No  . Sexual activity: Not Asked   Other Topics Concern  . None   Social History Narrative  . None    Past Medical History:  Diagnosis Date  . Anxiety   .  Depression   . Hyperlipidemia   . Vitamin D deficiency      Patient Active Problem List   Diagnosis Date Noted  . History of esophageal stricture 04/05/2015  . Anxiety 04/02/2015  . Solitary cyst of breast 04/02/2015  . Clinical depression 04/02/2015  . Cannot sleep 04/02/2015  . Awareness of heartbeats 04/02/2015  . Post-menopausal 02/19/2015  . Vitamin D deficiency 01/03/2015  . Dermatologic disease 08/27/2009  . Allergic rhinitis 08/01/2009  . H/O total hysterectomy 07/12/2008  . Hypercholesterolemia without hypertriglyceridemia 01/11/2003  . Acid reflux 06/02/2002  . Adult hypothyroidism 04/21/2002    Past Surgical History:  Procedure Laterality Date  . ABDOMINAL HYSTERECTOMY    . APPENDECTOMY    . BLADDER SURGERY    . CARPAL TUNNEL RELEASE     Both  . CHOLECYSTECTOMY    . KNEE SURGERY Left x's two  . TONSILLECTOMY      Family History        Family Status  Relation Status  . Mother Alive  . Father Alive   MI  . Sister Alive  . Brother Alive  . Paternal Grandmother Deceased at age 45   Coronary artery disease; lymphoma  . Sister Alive        Her family history includes Diabetes in her sister; Hypertension in her father.    Allergies  Allergen Reactions  . Erythromycin     Current Meds  Medication Sig  . estradiol (ESTRACE) 1 MG tablet Take 1 tablet (1 mg total) by mouth daily.  Marland Kitchen  levothyroxine (SYNTHROID, LEVOTHROID) 150 MCG tablet Take 1 tablet (150 mcg total) by mouth daily.  . Vitamin D, Ergocalciferol, (DRISDOL) 50000 units CAPS capsule Take 1 capsule (50,000 Units total) by mouth every 7 (seven) days.  . [DISCONTINUED] ALPRAZolam (XANAX) 0.5 MG tablet Take 1 tablet (0.5 mg total) by mouth 2 (two) times daily as needed for anxiety.    Patient Care Team: Mar Daring, PA-C as PCP - General (Family Medicine)     Objective:   Vitals: BP 120/70 (BP Location: Right Arm, Patient Position: Sitting, Cuff Size: Normal)   Pulse 65   Temp  97.7 F (36.5 C) (Oral)   Resp 16   Ht 4\' 11"  (1.499 m)   Wt 201 lb 3.2 oz (91.3 kg)   SpO2 96%   BMI 40.64 kg/m    Physical Exam  Constitutional: She is oriented to person, place, and time. She appears well-developed and well-nourished. No distress.  HENT:  Head: Normocephalic and atraumatic.  Right Ear: Hearing, tympanic membrane, external ear and ear canal normal.  Left Ear: Hearing, tympanic membrane, external ear and ear canal normal.  Nose: Nose normal.  Mouth/Throat: Uvula is midline, oropharynx is clear and moist and mucous membranes are normal. No oropharyngeal exudate.  Eyes: Conjunctivae and EOM are normal. Pupils are equal, round, and reactive to light. Right eye exhibits no discharge. Left eye exhibits no discharge. No scleral icterus.  Neck: Normal range of motion. Neck supple. No JVD present. Carotid bruit is not present. No tracheal deviation present. No thyromegaly present.  Cardiovascular: Normal rate, regular rhythm, normal heart sounds and intact distal pulses.  Exam reveals no gallop and no friction rub.   No murmur heard. Pulmonary/Chest: Effort normal and breath sounds normal. No respiratory distress. She has no wheezes. She has no rales. She exhibits no tenderness. Right breast exhibits no inverted nipple, no mass, no nipple discharge, no skin change and no tenderness. Left breast exhibits no inverted nipple, no mass, no nipple discharge, no skin change and no tenderness. Breasts are symmetrical.  Abdominal: Soft. Bowel sounds are normal. She exhibits no distension and no mass. There is no tenderness. There is no rebound and no guarding.  Musculoskeletal: Normal range of motion. She exhibits no edema or tenderness.  Lymphadenopathy:    She has no cervical adenopathy.  Neurological: She is alert and oriented to person, place, and time.  Skin: Skin is warm and dry. No rash noted. She is not diaphoretic.  Psychiatric: She has a normal mood and affect. Her behavior is  normal. Judgment and thought content normal.  Vitals reviewed.   Depression Screen No flowsheet data found.   Assessment & Plan:     Routine Health Maintenance and Physical Exam  Exercise Activities and Dietary recommendations Goals    None      Immunization History  Administered Date(s) Administered  . Influenza,inj,Quad PF,36+ Mos 04/05/2015, 04/07/2016  . Td 04/03/2004  . Tdap 08/27/2009  . Zoster 05/19/2013    Health Maintenance  Topic Date Due  . HIV Screening  08/28/1969  . MAMMOGRAM  04/11/2015  . INFLUENZA VACCINE  01/14/2016  . COLONOSCOPY  08/14/2017  . TETANUS/TDAP  08/28/2019  . ZOSTAVAX  Completed  . Hepatitis C Screening  Completed  . PAP SMEAR  Excluded      Discussed health benefits of physical activity, and encouraged her to engage in regular exercise appropriate for her age and condition.    1. Annual physical exam Normal physical exam today.  Will check labs as below and f/u pending lab results. If labs are stable and WNL she will not need to have these rechecked for one year at her next annual physical exam. She is to call the office in the meantime if she has any acute issue, questions or concerns. - CBC with Differential/Platelet - Comprehensive metabolic panel  2. Adult hypothyroidism Will check labs as below and f/u pending results. - TSH  3. Vitamin D deficiency On high dose supplement. Will recheck labs to see if still clinically necessary. - Vitamin D (25 hydroxy)  4. Hypercholesterolemia without hypertriglyceridemia Will check labs as below and f/u pending results. - Lipid panel  5. Need for influenza vaccination Flu vaccine given today without complication. Patient sat upright for 15 minutes to check for adverse reaction before being released. - Flu Vaccine QUAD 36+ mos IM  6. Moderate episode of recurrent major depressive disorder (HCC) Worsening symptoms and not sleeping well. Will add lexapro as below. I will see her back  in 4 weeks to see how she is doing.  - escitalopram (LEXAPRO) 10 MG tablet; Take 1/2 tab PO q h.s. X 1 week, then increase to 1 tab PO q h.s.  Dispense: 30 tablet; Refill: 1  7. Insomnia, unspecified type Will try lexapro at night to see if this helps her sleep. She has used [place] successfully in the past but stopped on her own due to hearing stories of people doing things while they were taking the medication. Will try lexapro as below and will see her back in 4 weeks to see if we need to add something to help her sleep. - escitalopram (LEXAPRO) 10 MG tablet; Take 1/2 tab PO q h.s. X 1 week, then increase to 1 tab PO q h.s.  Dispense: 30 tablet; Refill: 1  8. Morbidly obese ([place]) Will check labs as below and f/u pending results. - Hemoglobin A1c  9. Breast cancer screening Breast exam today was normal. There is no family history of breast cancer. She does perform regular self breast exams. Mammogram was ordered as below. Information for [facility] clinic was given to patient so she may schedule her mammogram at her convenience. - MM DIGITAL SCREENING BILATERAL; Future  --------------------------------------------------------------------    Mar Daring, PA-C  [facility]

## 2016-04-07 NOTE — Patient Instructions (Signed)
Escitalopram tablets What is this medicine? ESCITALOPRAM (es sye TAL oh pram) is used to treat depression and certain types of anxiety. This medicine may be used for other purposes; ask your health care provider or pharmacist if you have questions. What should I tell my health care provider before I take this medicine? They need to know if you have any of these conditions: -bipolar disorder or a family history of bipolar disorder -diabetes -glaucoma -heart disease -kidney or liver disease -receiving electroconvulsive therapy -seizures (convulsions) -suicidal thoughts, plans, or attempt by you or a family member -an unusual or allergic reaction to escitalopram, the related drug citalopram, other medicines, foods, dyes, or preservatives -pregnant or trying to become pregnant -breast-feeding How should I use this medicine? Take this medicine by mouth with a glass of water. Follow the directions on the prescription label. You can take it with or without food. If it upsets your stomach, take it with food. Take your medicine at regular intervals. Do not take it more often than directed. Do not stop taking this medicine suddenly except upon the advice of your doctor. Stopping this medicine too quickly may cause serious side effects or your condition may worsen. A special MedGuide will be given to you by the pharmacist with each prescription and refill. Be sure to read this information carefully each time. Talk to your pediatrician regarding the use of this medicine in children. Special care may be needed. Overdosage: If you think you have taken too much of this medicine contact a poison control center or emergency room at once. NOTE: This medicine is only for you. Do not share this medicine with others. What if I miss a dose? If you miss a dose, take it as soon as you can. If it is almost time for your next dose, take only that dose. Do not take double or extra doses. What may interact with this  medicine? Do not take this medicine with any of the following medications: -certain medicines for fungal infections like fluconazole, itraconazole, ketoconazole, posaconazole, voriconazole -cisapride -citalopram -dofetilide -dronedarone -linezolid -MAOIs like Carbex, Eldepryl, Marplan, Nardil, and Parnate -methylene blue (injected into a vein) -pimozide -thioridazine -ziprasidone This medicine may also interact with the following medications: -alcohol -aspirin and aspirin-like medicines -carbamazepine -certain medicines for depression, anxiety, or psychotic disturbances -certain medicines for migraine headache like almotriptan, eletriptan, frovatriptan, naratriptan, rizatriptan, sumatriptan, zolmitriptan -certain medicines for sleep -certain medicines that treat or prevent blood clots like warfarin, enoxaparin, dalteparin -cimetidine -diuretics -fentanyl -furazolidone -isoniazid -lithium -metoprolol -NSAIDs, medicines for pain and inflammation, like ibuprofen or naproxen -other medicines that prolong the QT interval (cause an abnormal heart rhythm) -procarbazine -rasagiline -supplements like St. John's wort, kava kava, valerian -tramadol -tryptophan This list may not describe all possible interactions. Give your health care provider a list of all the medicines, herbs, non-prescription drugs, or dietary supplements you use. Also tell them if you smoke, drink alcohol, or use illegal drugs. Some items may interact with your medicine. What should I watch for while using this medicine? Tell your doctor if your symptoms do not get better or if they get worse. Visit your doctor or health care professional for regular checks on your progress. Because it may take several weeks to see the full effects of this medicine, it is important to continue your treatment as prescribed by your doctor. Patients and their families should watch out for new or worsening thoughts of suicide or  depression. Also watch out for sudden changes in feelings such  or health care professional for regular checks on your progress. Because it may take several weeks to see the full effects of this medicine, it is important to continue your treatment as prescribed by your doctor.  Patients and their families should watch out for new or worsening thoughts of suicide or  depression. Also watch out for sudden changes in feelings such as feeling anxious, agitated, panicky, irritable, hostile, aggressive, impulsive, severely restless, overly excited and hyperactive, or not being able to sleep. If this happens, especially at the beginning of treatment or after a change in dose, call your health care professional.  You may get drowsy or dizzy. Do not drive, use machinery, or do anything that needs mental alertness until you know how this medicine affects you. Do not stand or sit up quickly, especially if you are an older patient. This reduces the risk of dizzy or fainting spells. Alcohol may interfere with the effect of this medicine. Avoid alcoholic drinks.  Your mouth may get dry. Chewing sugarless gum or sucking hard candy, and drinking plenty of water may help. Contact your doctor if the problem does not go away or is severe.  What side effects may I notice from receiving this medicine?  Side effects that you should report to your doctor or health care professional as soon as possible:  -allergic reactions like skin rash, itching or hives, swelling of the face, lips, or tongue  -confusion  -feeling faint or lightheaded, falls  -fast talking and excited feelings or actions that are out of control  -hallucination, loss of contact with reality  -seizures  -suicidal thoughts or other mood changes  -unusual bleeding or bruising  Side effects that usually do not require medical attention (report to your doctor or health care professional if they continue or are bothersome):  -blurred vision  -changes in appetite  -change in sex drive or performance  -headache  -increased sweating  -nausea  This list may not describe all possible side effects. Call your doctor for medical advice about side effects. You may report side effects to FDA at 1-800-FDA-1088.  Where should I keep my medicine?  Keep out of reach of children.  Store at room temperature between 15 and 30 degrees C (59 and 86 degrees  F). Throw away any unused medicine after the expiration date.  NOTE: This sheet is a summary. It may not cover all possible information. If you have questions about this medicine, talk to your doctor, pharmacist, or health care provider.     © 2016, [employer]. (2012-12-27 12:32:55)

## 2016-04-08 ENCOUNTER — Telehealth: Payer: Self-pay

## 2016-04-08 LAB — CBC WITH DIFFERENTIAL/PLATELET
Basophils Absolute: 0 10*3/uL (ref 0.0–0.2)
Basos: 1 %
EOS (ABSOLUTE): 0.2 10*3/uL (ref 0.0–0.4)
Eos: 3 %
Hematocrit: 39.1 % (ref 34.0–46.6)
Hemoglobin: 13.4 g/dL (ref 11.1–15.9)
Immature Grans (Abs): 0 10*3/uL (ref 0.0–0.1)
Immature Granulocytes: 0 %
Lymphocytes Absolute: 1.6 10*3/uL (ref 0.7–3.1)
Lymphs: 29 %
MCH: 30.3 pg (ref 26.6–33.0)
MCHC: 34.3 g/dL (ref 31.5–35.7)
MCV: 89 fL (ref 79–97)
Monocytes Absolute: 0.6 10*3/uL (ref 0.1–0.9)
Monocytes: 11 %
Neutrophils Absolute: 3.1 10*3/uL (ref 1.4–7.0)
Neutrophils: 56 %
Platelets: 328 10*3/uL (ref 150–379)
RBC: 4.42 x10E6/uL (ref 3.77–5.28)
RDW: 13.2 % (ref 12.3–15.4)
WBC: 5.6 10*3/uL (ref 3.4–10.8)

## 2016-04-08 LAB — LIPID PANEL
Chol/HDL Ratio: 3 ratio units (ref 0.0–4.4)
Cholesterol, Total: 206 mg/dL — ABNORMAL HIGH (ref 100–199)
HDL: 68 mg/dL (ref >39)
LDL Calculated: 120 mg/dL — ABNORMAL HIGH (ref 0–99)
Triglycerides: 89 mg/dL (ref 0–149)
VLDL Cholesterol Cal: 18 mg/dL (ref 5–40)

## 2016-04-08 LAB — COMPREHENSIVE METABOLIC PANEL
ALT: 12 IU/L (ref 0–32)
AST: 13 IU/L (ref 0–40)
Albumin/Globulin Ratio: 1.4 (ref 1.2–2.2)
Albumin: 4.1 g/dL (ref 3.6–4.8)
Alkaline Phosphatase: 95 IU/L (ref 39–117)
BUN/Creatinine Ratio: 24 (ref 12–28)
BUN: 16 mg/dL (ref 8–27)
Bilirubin Total: 0.3 mg/dL (ref 0.0–1.2)
CO2: 23 mmol/L (ref 18–29)
Calcium: 9.1 mg/dL (ref 8.7–10.3)
Chloride: 102 mmol/L (ref 96–106)
Creatinine, Ser: 0.68 mg/dL (ref 0.57–1.00)
GFR calc Af Amer: 109 mL/min/{1.73_m2} (ref >59)
GFR calc non Af Amer: 95 mL/min/{1.73_m2} (ref >59)
Globulin, Total: 3 g/dL (ref 1.5–4.5)
Glucose: 89 mg/dL (ref 65–99)
Potassium: 4.5 mmol/L (ref 3.5–5.2)
Sodium: 138 mmol/L (ref 134–144)
Total Protein: 7.1 g/dL (ref 6.0–8.5)

## 2016-04-08 LAB — VITAMIN D 25 HYDROXY (VIT D DEFICIENCY, FRACTURES): Vit D, 25-Hydroxy: 28.3 ng/mL — ABNORMAL LOW (ref 30.0–100.0)

## 2016-04-08 LAB — TSH: TSH: 5.91 u[IU]/mL — ABNORMAL HIGH (ref 0.450–4.500)

## 2016-04-08 LAB — HEMOGLOBIN A1C
Est. average glucose Bld gHb Est-mCnc: 103 mg/dL
Hgb A1c MFr Bld: 5.2 % (ref 4.8–5.6)

## 2016-04-08 MED ORDER — LEVOTHYROXINE SODIUM 175 MCG PO TABS: 175.0000 ug | ORAL_TABLET | Freq: Every day | ORAL | 1 refills | Status: DC

## 2016-04-08 NOTE — Telephone Encounter (Signed)
Patient has been advised. KW 

## 2016-04-08 NOTE — Telephone Encounter (Signed)
Left Message with daughter for patient to return call.  Thanks,  -Itzamara Casas

## 2016-04-08 NOTE — Telephone Encounter (Signed)
-----   Message from Mar Daring, [place] sent at 04/08/2016  1:12 PM EDT ----- Cholesterol is fairly stable but up from last year. Vit D is still low at 28 but improved from 24 last year. Continue vit D supplement 1000-2000IU daily OTC. TSH is off. Will need to increase levothyroxine dose and repeat in 8 weeks.

## 2016-04-08 NOTE — Telephone Encounter (Signed)
Tried calling but pt's voice mail is not set up.   Thanks,   -Mickel Baas

## 2016-04-08 NOTE — Addendum Note (Signed)
Addended by: Mar Daring on: 04/08/2016 01:13 PM   Modules accepted: Orders

## 2016-04-08 NOTE — Telephone Encounter (Signed)
Pt is returning call.  [id number]

## 2016-05-04 ENCOUNTER — Ambulatory Visit: Payer: BLUE CROSS/BLUE SHIELD | Admitting: Physician Assistant

## 2016-05-20 ENCOUNTER — Other Ambulatory Visit: Payer: Self-pay

## 2016-05-20 DIAGNOSIS — F331 Major depressive disorder, recurrent, moderate: Secondary | ICD-10-CM

## 2016-05-20 DIAGNOSIS — G4700 Insomnia, unspecified: Secondary | ICD-10-CM

## 2016-05-20 MED ORDER — ESCITALOPRAM OXALATE 10 MG PO TABS: 10.0000 mg | ORAL_TABLET | Freq: Every day | ORAL | 1 refills | Status: DC

## 2016-05-20 NOTE — Telephone Encounter (Signed)
Pharmacy requesting refills. Thanks!  

## 2016-06-09 ENCOUNTER — Telehealth: Payer: Self-pay

## 2016-06-09 NOTE — Telephone Encounter (Signed)
No answer/ not able to LM.  Re: patient called 06/08/16 with c/o coughing up yellow green mucus. According to the message patient needed something called in.  Wanted to follow-up on how patient was doing.  Thanks,  -Lorette Peterkin

## 2016-06-10 ENCOUNTER — Ambulatory Visit (INDEPENDENT_AMBULATORY_CARE_PROVIDER_SITE_OTHER): Payer: BLUE CROSS/BLUE SHIELD | Admitting: Physician Assistant

## 2016-06-10 ENCOUNTER — Encounter: Payer: Self-pay | Admitting: Physician Assistant

## 2016-06-10 VITALS — BP 130/86 | HR 73 | Temp 98.1°F | Resp 16 | Wt 197.0 lb

## 2016-06-10 DIAGNOSIS — R05 Cough: Secondary | ICD-10-CM

## 2016-06-10 DIAGNOSIS — R059 Cough, unspecified: Secondary | ICD-10-CM

## 2016-06-10 DIAGNOSIS — J069 Acute upper respiratory infection, unspecified: Secondary | ICD-10-CM | POA: Diagnosis not present

## 2016-06-10 MED ORDER — AMOXICILLIN-POT CLAVULANATE 875-125 MG PO TABS: 1.0000 | ORAL_TABLET | Freq: Two times a day (BID) | ORAL | 0 refills | Status: DC

## 2016-06-10 MED ORDER — BENZONATATE 100 MG PO CAPS: 100.0000 mg | ORAL_CAPSULE | Freq: Two times a day (BID) | ORAL | 0 refills | Status: DC | PRN

## 2016-06-10 NOTE — Patient Instructions (Signed)
Upper Respiratory Infection, Adult Most upper respiratory infections (URIs) are a viral infection of the air passages leading to the lungs. A URI affects the nose, throat, and upper air passages. The most common type of URI is nasopharyngitis and is typically referred to as "the common cold." URIs run their course and usually go away on their own. Most of the time, a URI does not require medical attention, but sometimes a bacterial infection in the upper airways can follow a viral infection. This is called a secondary infection. Sinus and middle ear infections are common types of secondary upper respiratory infections. Bacterial pneumonia can also complicate a URI. A URI can worsen asthma and chronic obstructive pulmonary disease (COPD). Sometimes, these complications can require emergency medical care and may be life threatening. What are the causes? Almost all URIs are caused by viruses. A virus is a type of germ and can spread from one person to another. What increases the risk? You may be at risk for a URI if:  You smoke.  You have chronic heart or lung disease.  You have a weakened defense (immune) system.  You are very young or very old.  You have nasal allergies or asthma.  You work in crowded or poorly ventilated areas.  You work in health care facilities or schools.  What are the signs or symptoms? Symptoms typically develop 2-3 days after you come in contact with a cold virus. Most viral URIs last 7-10 days. However, viral URIs from the influenza virus (flu virus) can last 14-18 days and are typically more severe. Symptoms may include:  Runny or stuffy (congested) nose.  Sneezing.  Cough.  Sore throat.  Headache.  Fatigue.  Fever.  Loss of appetite.  Pain in your forehead, behind your eyes, and over your cheekbones (sinus pain).  Muscle aches.  How is this diagnosed? Your health care provider may diagnose a URI by:  Physical exam.  Tests to check that your  symptoms are not due to another condition such as: ? Strep throat. ? Sinusitis. ? Pneumonia. ? Asthma.  How is this treated? A URI goes away on its own with time. It cannot be cured with medicines, but medicines may be prescribed or recommended to relieve symptoms. Medicines may help:  Reduce your fever.  Reduce your cough.  Relieve nasal congestion.  Follow these instructions at home:  Take medicines only as directed by your health care provider.  Gargle warm saltwater or take cough drops to comfort your throat as directed by your health care provider.  Use a warm mist humidifier or inhale steam from a shower to increase air moisture. This may make it easier to breathe.  Drink enough fluid to keep your urine clear or pale yellow.  Eat soups and other clear broths and maintain good nutrition.  Rest as needed.  Return to work when your temperature has returned to normal or as your health care provider advises. You may need to stay home longer to avoid infecting others. You can also use a face mask and careful hand washing to prevent spread of the virus.  Increase the usage of your inhaler if you have asthma.  Do not use any tobacco products, including cigarettes, chewing tobacco, or electronic cigarettes. If you need help quitting, ask your health care provider. How is this prevented? The best way to protect yourself from getting a cold is to practice good hygiene.  Avoid oral or hand contact with people with cold symptoms.  Wash your   hands often if contact occurs.  There is no clear evidence that vitamin C, vitamin E, echinacea, or exercise reduces the chance of developing a cold. However, it is always recommended to get plenty of rest, exercise, and practice good nutrition. Contact a health care provider if:  You are getting worse rather than better.  Your symptoms are not controlled by medicine.  You have chills.  You have worsening shortness of breath.  You have  brown or red mucus.  You have yellow or brown nasal discharge.  You have pain in your face, especially when you bend forward.  You have a fever.  You have swollen neck glands.  You have pain while swallowing.  You have white areas in the back of your throat. Get help right away if:  You have severe or persistent: ? Headache. ? Ear pain. ? Sinus pain. ? Chest pain.  You have chronic lung disease and any of the following: ? Wheezing. ? Prolonged cough. ? Coughing up blood. ? A change in your usual mucus.  You have a stiff neck.  You have changes in your: ? Vision. ? Hearing. ? Thinking. ? Mood. This information is not intended to replace advice given to you by your health care provider. Make sure you discuss any questions you have with your health care provider. Document Released: 11/25/2000 Document Revised: 02/02/2016 Document Reviewed: 09/06/2013 Elsevier Interactive Patient Education  2017 [employer]  

## 2016-06-10 NOTE — Progress Notes (Signed)
Patient: Tammy Bailey Female    DOB: 1956-06-03   60 y.o.   MRN: [id number] Visit Date: 06/10/2016  Today's Provider: Mar Daring, PA-C   Chief Complaint  Patient presents with  . URI   Subjective:    URI   This is a new problem. The current episode started 1 to 4 weeks ago. The problem has been unchanged. There has been no fever. Associated symptoms include congestion, coughing (productive with greenish-yellow to white sputum), diarrhea (some yesterday), headaches, nausea, a plugged ear sensation, rhinorrhea, sinus pain, a sore throat (from coughing) and wheezing. Pertinent negatives include no abdominal pain, chest pain, ear pain, neck pain, sneezing, swollen glands or vomiting. Treatments tried: Mucinex DM, NyQuil. The treatment provided mild relief.       Allergies  Allergen Reactions  . Erythromycin      Current Outpatient Prescriptions:  .  escitalopram (LEXAPRO) 10 MG tablet, Take 1 tablet (10 mg total) by mouth at bedtime., Disp: 90 tablet, Rfl: 1 .  estradiol (ESTRACE) 1 MG tablet, Take 1 tablet (1 mg total) by mouth daily., Disp: 30 tablet, Rfl: 6 .  levothyroxine (SYNTHROID, LEVOTHROID) 175 MCG tablet, Take 1 tablet (175 mcg total) by mouth daily before breakfast., Disp: 90 tablet, Rfl: 1 .  Vitamin D, Ergocalciferol, (DRISDOL) 50000 units CAPS capsule, Take 1 capsule (50,000 Units total) by mouth every 7 (seven) days., Disp: 4 capsule, Rfl: 6  Review of Systems  Constitutional: Positive for chills and fatigue. Negative for fever.  HENT: Positive for congestion, rhinorrhea, sinus pain and sore throat (from coughing). Negative for ear pain, sneezing and trouble swallowing.   Respiratory: Positive for cough (productive with greenish-yellow to white sputum) and wheezing. Negative for chest tightness and shortness of breath.   Cardiovascular: Negative for chest pain, palpitations and leg swelling.  Gastrointestinal: Positive for diarrhea (some yesterday)  and nausea. Negative for abdominal pain and vomiting.  Musculoskeletal: Negative for neck pain.  Neurological: Positive for headaches. Negative for dizziness.    Social History  Substance Use Topics  . Smoking status: Never Smoker  . Smokeless tobacco: Never Used  . Alcohol use No   Objective:   BP 130/86 (BP Location: Left Arm, Patient Position: Sitting, Cuff Size: Large)   Pulse 73   Temp 98.1 F (36.7 C) (Oral)   Resp 16   Wt 197 lb (89.4 kg)   SpO2 96%   BMI 39.79 kg/m   Physical Exam  Constitutional: She appears well-developed and well-nourished. No distress.  HENT:  Head: Normocephalic and atraumatic.  Right Ear: Hearing, tympanic membrane, external ear and ear canal normal.  Left Ear: Hearing, tympanic membrane, external ear and ear canal normal.  Nose: Mucosal edema and rhinorrhea present. Right sinus exhibits no maxillary sinus tenderness and no frontal sinus tenderness. Left sinus exhibits no maxillary sinus tenderness and no frontal sinus tenderness.  Mouth/Throat: Uvula is midline, oropharynx is clear and moist and mucous membranes are normal. No oropharyngeal exudate, posterior oropharyngeal edema or posterior oropharyngeal erythema.  Eyes: Conjunctivae are normal. Pupils are equal, round, and reactive to light. Right eye exhibits no discharge. Left eye exhibits no discharge. No scleral icterus.  Neck: Normal range of motion. Neck supple. No tracheal deviation present. No thyromegaly present.  Cardiovascular: Normal rate, regular rhythm and normal heart sounds.  Exam reveals no gallop and no friction rub.   No murmur heard. Pulmonary/Chest: Effort normal and breath sounds normal. No stridor. No respiratory distress.  She has no wheezes. She has no rales.  Lymphadenopathy:    She has no cervical adenopathy.  Skin: Skin is warm and dry. She is not diaphoretic.  Vitals reviewed.      Assessment & Plan:     1. Upper respiratory tract infection, unspecified  type Worsening symptoms that have not responded to OTC medications. Will give augmentin as below. Continue Mucinex for congestion. Stay well hydrated and get plenty of rest. Call if no symptom improvement or if symptoms worsen. - amoxicillin-clavulanate (AUGMENTIN) 875-125 MG tablet; Take 1 tablet by mouth 2 (two) times daily.  Dispense: 20 tablet; Refill: 0  2. Cough Worsening. I will give her Tessalon Perles as below. She is to call if cough does not improve or if she develops shortness of breath or chest pain with it. - benzonatate (TESSALON) 100 MG capsule; Take 1 capsule (100 mg total) by mouth 2 (two) times daily as needed for cough.  Dispense: 30 capsule; Refill: 0     Patient seen and examined by Mar Daring, PA-C, and note scribed by Renaldo Fiddler, CMA.  Mar Daring, PA-C  [facility]

## 2016-06-10 NOTE — Telephone Encounter (Signed)
Spoke with patient and scheduled her at 4pm

## 2016-06-19 ENCOUNTER — Telehealth: Payer: Self-pay | Admitting: Physician Assistant

## 2016-06-19 DIAGNOSIS — R112 Nausea with vomiting, unspecified: Secondary | ICD-10-CM

## 2016-06-19 MED ORDER — PROMETHAZINE HCL 25 MG PO TABS: 25.0000 mg | ORAL_TABLET | Freq: Three times a day (TID) | ORAL | 0 refills | Status: DC | PRN

## 2016-06-19 NOTE — Telephone Encounter (Signed)
Pt states she had vomiting from 3pm until midnight and then is feeling nauseous since.  She states she saw you a week ago and would like to see if you could call something in.  [phone] (before 3pm) [phone] (after 3pm)

## 2016-06-19 NOTE — Telephone Encounter (Signed)
Phenergan sent to [facility]

## 2016-06-19 NOTE — Telephone Encounter (Signed)
Tried calling pt; pt left work and could not leave VM. Tammy Bailey, CMA

## 2016-06-19 NOTE — Telephone Encounter (Signed)
Please Review.  Thanks,  -Annia Gomm 

## 2016-06-19 NOTE — Telephone Encounter (Signed)
Patient called and back and was advised as below-aa

## 2016-08-17 ENCOUNTER — Other Ambulatory Visit: Payer: Self-pay

## 2016-08-17 DIAGNOSIS — E039 Hypothyroidism, unspecified: Secondary | ICD-10-CM

## 2016-08-17 MED ORDER — LEVOTHYROXINE SODIUM 175 MCG PO TABS: 175.0000 ug | ORAL_TABLET | Freq: Every day | ORAL | 1 refills | Status: DC

## 2016-10-02 ENCOUNTER — Telehealth: Payer: Self-pay | Admitting: Physician Assistant

## 2016-10-02 DIAGNOSIS — F331 Major depressive disorder, recurrent, moderate: Secondary | ICD-10-CM

## 2016-10-02 DIAGNOSIS — G4700 Insomnia, unspecified: Secondary | ICD-10-CM

## 2016-10-02 MED ORDER — ESCITALOPRAM OXALATE 10 MG PO TABS: 10.0000 mg | ORAL_TABLET | Freq: Every day | ORAL | 1 refills | Status: DC

## 2016-10-02 NOTE — Telephone Encounter (Signed)
Lexapro refilled. 

## 2016-10-02 NOTE — Telephone Encounter (Signed)
Tammy Bailey faxed a request on the following medication. Thanks CC  escitalopram (LEXAPRO) 10 MG tablet  Take 1 tablet by mouth at bedtime.

## 2016-10-02 NOTE — Telephone Encounter (Signed)
Please review-aa 

## 2016-11-17 ENCOUNTER — Telehealth: Payer: Self-pay | Admitting: Physician Assistant

## 2016-11-17 NOTE — Telephone Encounter (Signed)
Pt states her daughter is having health issues and her daughters doctor is asking about her mom's (our patient) history.  Pt is asking if labs have been done to check her prolactin.  If we do not have this pt is asking if she can pick up a lab slip to have this done?  Please advise.  CB#[phone]/MW

## 2016-11-17 NOTE — Telephone Encounter (Signed)
Please review. Emily Drozdowski, CMA  

## 2016-11-18 NOTE — Telephone Encounter (Signed)
Please review

## 2016-11-18 NOTE — Telephone Encounter (Signed)
Patient advised as directed below. Per patient she is not sure what her daughter has.She knows they said something about Thyroid disease and something in her brain. Patient is going to call her daughter and will call back to let us know what exactly it is that they think she might have.

## 2016-11-18 NOTE — Telephone Encounter (Signed)
I need to have something to associate the order with so insurance will cover.

## 2016-11-18 NOTE — Telephone Encounter (Signed)
Pt stated that she is going to hold off on having that test done for now and will call back once she has more information. Thanks TNP

## 2016-11-18 NOTE — Telephone Encounter (Signed)
Noted  

## 2016-11-23 ENCOUNTER — Encounter: Payer: Self-pay | Admitting: Physician Assistant

## 2016-11-23 ENCOUNTER — Ambulatory Visit (INDEPENDENT_AMBULATORY_CARE_PROVIDER_SITE_OTHER): Payer: BLUE CROSS/BLUE SHIELD | Admitting: Physician Assistant

## 2016-11-23 VITALS — BP 132/72 | HR 68 | Temp 98.2°F | Resp 16 | Wt 202.0 lb

## 2016-11-23 DIAGNOSIS — Z6841 Body Mass Index (BMI) 40.0 and over, adult: Secondary | ICD-10-CM

## 2016-11-23 DIAGNOSIS — E039 Hypothyroidism, unspecified: Secondary | ICD-10-CM | POA: Diagnosis not present

## 2016-11-23 DIAGNOSIS — M7662 Achilles tendinitis, left leg: Secondary | ICD-10-CM

## 2016-11-23 MED ORDER — MELOXICAM 15 MG PO TABS: 15.0000 mg | ORAL_TABLET | Freq: Every day | ORAL | 0 refills | Status: DC

## 2016-11-23 NOTE — Patient Instructions (Signed)
Calf stretches, biofreeze, ice massages with frozen water bottle  Calorie Counting for Weight Loss Calories are units of energy. Your body needs a certain amount of calories from food to keep you going throughout the day. When you eat more calories than your body needs, your body stores the extra calories as fat. When you eat fewer calories than your body needs, your body burns fat to get the energy it needs. Calorie counting means keeping track of how many calories you eat and drink each day. Calorie counting can be helpful if you need to lose weight. If you make sure to eat fewer calories than your body needs, you should lose weight. Ask your health care provider what a healthy weight is for you. For calorie counting to work, you will need to eat the right number of calories in a day in order to lose a healthy amount of weight per week. A dietitian can help you determine how many calories you need in a day and will give you suggestions on how to reach your calorie goal.  A healthy amount of weight to lose per week is usually 1-2 lb (0.5-0.9 kg). This usually means that your daily calorie intake should be reduced by 500-750 calories.  Eating 1,200 - 1,500 calories per day can help most women lose weight.  Eating 1,500 - 1,800 calories per day can help most men lose weight.  What is my plan? My goal is to have 1200 calories per day. If I have this many calories per day, I should lose around 1-2 pounds per week. What do I need to know about calorie counting? In order to meet your daily calorie goal, you will need to:  Find out how many calories are in each food you would like to eat. Try to do this before you eat.  Decide how much of the food you plan to eat.  Write down what you ate and how many calories it had. Doing this is called keeping a food log.  To successfully lose weight, it is important to balance calorie counting with a healthy lifestyle that includes regular activity. Aim for  150 minutes of moderate exercise (such as walking) or 75 minutes of vigorous exercise (such as running) each week. Where do I find calorie information?  The number of calories in a food can be found on a Nutrition Facts label. If a food does not have a Nutrition Facts label, try to look up the calories online or ask your dietitian for help. Remember that calories are listed per serving. If you choose to have more than one serving of a food, you will have to multiply the calories per serving by the amount of servings you plan to eat. For example, the label on a package of bread might say that a serving size is 1 slice and that there are 90 calories in a serving. If you eat 1 slice, you will have eaten 90 calories. If you eat 2 slices, you will have eaten 180 calories. How do I keep a food log? Immediately after each meal, record the following information in your food log:  What you ate. Don't forget to include toppings, sauces, and other extras on the food.  How much you ate. This can be measured in cups, ounces, or number of items.  How many calories each food and drink had.  The total number of calories in the meal.  Keep your food log near you, such as in a small notebook in  your pocket, or use a mobile app or website. Some programs will calculate calories for you and show you how many calories you have left for the day to meet your goal. What are some calorie counting tips?  Use your calories on foods and drinks that will fill you up and not leave you hungry: ? Some examples of foods that fill you up are nuts and nut butters, vegetables, lean proteins, and high-fiber foods like whole grains. High-fiber foods are foods with more than 5 g fiber per serving. ? Drinks such as sodas, specialty coffee drinks, alcohol, and juices have a lot of calories, yet do not fill you up.  Eat nutritious foods and avoid empty calories. Empty calories are calories you get from foods or beverages that do not  have many vitamins or protein, such as candy, sweets, and soda. It is better to have a nutritious high-calorie food (such as an avocado) than a food with few nutrients (such as a bag of chips).  Know how many calories are in the foods you eat most often. This will help you calculate calorie counts faster.  Pay attention to calories in drinks. Low-calorie drinks include water and unsweetened drinks.  Pay attention to nutrition labels for "low fat" or "fat free" foods. These foods sometimes have the same amount of calories or more calories than the full fat versions. They also often have added sugar, starch, or salt, to make up for flavor that was removed with the fat.  Find a way of tracking calories that works for you. Get creative. Try different apps or programs if writing down calories does not work for you. What are some portion control tips?  Know how many calories are in a serving. This will help you know how many servings of a certain food you can have.  Use a measuring cup to measure serving sizes. You could also try weighing out portions on a kitchen scale. With time, you will be able to estimate serving sizes for some foods.  Take some time to put servings of different foods on your favorite plates, bowls, and cups so you know what a serving looks like.  Try not to eat straight from a bag or box. Doing this can lead to overeating. Put the amount you would like to eat in a cup or on a plate to make sure you are eating the right portion.  Use smaller plates, glasses, and bowls to prevent overeating.  Try not to multitask (for example, watch TV or use your computer) while eating. If it is time to eat, sit down at a table and enjoy your food. This will help you to know when you are full. It will also help you to be aware of what you are eating and how much you are eating. What are tips for following this plan? Reading food labels  Check the calorie count compared to the serving size.  The serving size may be smaller than what you are used to eating.  Check the source of the calories. Make sure the food you are eating is high in vitamins and protein and low in saturated and trans fats. Shopping  Read nutrition labels while you shop. This will help you make healthy decisions before you decide to purchase your food.  Make a grocery list and stick to it. Cooking  Try to cook your favorite foods in a healthier way. For example, try baking instead of frying.  Use low-fat dairy products. Meal planning  Use more fruits and vegetables. Half of your plate should be fruits and vegetables.  Include lean proteins like poultry and fish. How do I count calories when eating out?  Ask for smaller portion sizes.  Consider sharing an entree and sides instead of getting your own entree.  If you get your own entree, eat only half. Ask for a box at the beginning of your meal and put the rest of your entree in it so you are not tempted to eat it.  If calories are listed on the menu, choose the lower calorie options.  Choose dishes that include vegetables, fruits, whole grains, low-fat dairy products, and lean protein.  Choose items that are boiled, broiled, grilled, or steamed. Stay away from items that are buttered, battered, fried, or served with cream sauce. Items labeled "crispy" are usually fried, unless stated otherwise.  Choose water, low-fat milk, unsweetened iced tea, or other drinks without added sugar. If you want an alcoholic beverage, choose a lower calorie option such as a glass of wine or light beer.  Ask for dressings, sauces, and syrups on the side. These are usually high in calories, so you should limit the amount you eat.  If you want a salad, choose a garden salad and ask for grilled meats. Avoid extra toppings like bacon, cheese, or fried items. Ask for the dressing on the side, or ask for olive oil and vinegar or lemon to use as dressing.  Estimate how many  servings of a food you are given. For example, a serving of cooked rice is  cup or about the size of half a baseball. Knowing serving sizes will help you be aware of how much food you are eating at restaurants. The list below tells you how big or small some common portion sizes are based on everyday objects: ? 1 oz-4 stacked dice. ? 3 oz-1 deck of cards. ? 1 tsp-1 die. ? 1 Tbsp- a ping-pong ball. ? 2 Tbsp-1 ping-pong ball. ?  cup- baseball. ? 1 cup-1 baseball. Summary  Calorie counting means keeping track of how many calories you eat and drink each day. If you eat fewer calories than your body needs, you should lose weight.  A healthy amount of weight to lose per week is usually 1-2 lb (0.5-0.9 kg). This usually means reducing your daily calorie intake by 500-750 calories.  The number of calories in a food can be found on a Nutrition Facts label. If a food does not have a Nutrition Facts label, try to look up the calories online or ask your dietitian for help.  Use your calories on foods and drinks that will fill you up, and not on foods and drinks that will leave you hungry.  Use smaller plates, glasses, and bowls to prevent overeating. This information is not intended to replace advice given to you by your health care provider. Make sure you discuss any questions you have with your health care provider. Document Released: 06/01/2005 Document Revised: 05/01/2016 Document Reviewed: 05/01/2016 Elsevier Interactive Patient Education  2017 [employer].

## 2016-11-23 NOTE — Progress Notes (Signed)
Patient: Tammy Bailey Female    DOB: [birth year]   61 y.o.   MRN: [id number] Visit Date: 11/23/2016  Today's Provider: Mar Daring, PA-C   Chief Complaint  Patient presents with  . Foot Pain   Subjective:    Foot Pain  Episode onset: Left foot pain located in the heel has been present for several months. The problem has been gradually worsening. Associated symptoms include arthralgias, fatigue (baseline. Pt states she does not snore, and she has never been told that she has apneic episodes) and myalgias (leg pain). Pertinent negatives include no abdominal pain, chest pain, chills, diaphoresis, fever, headaches or numbness. Exacerbated by: walking. She has tried rest for the symptoms.  Pt reports the pain is throbbing. No known injury.    Allergies  Allergen Reactions  . Erythromycin      Current Outpatient Prescriptions:  .  Aspirin-Salicylamide-Caffeine (BC HEADACHE POWDER PO), Take 1 Package by mouth as needed., Disp: , Rfl:  .  escitalopram (LEXAPRO) 10 MG tablet, Take 1 tablet (10 mg total) by mouth at bedtime., Disp: 90 tablet, Rfl: 1 .  estradiol (ESTRACE) 1 MG tablet, Take 1 tablet (1 mg total) by mouth daily., Disp: 30 tablet, Rfl: 6 .  levothyroxine (SYNTHROID, LEVOTHROID) 175 MCG tablet, Take 1 tablet (175 mcg total) by mouth daily before breakfast., Disp: 90 tablet, Rfl: 1 .  Vitamin D, Ergocalciferol, (DRISDOL) 50000 units CAPS capsule, Take 1 capsule (50,000 Units total) by mouth every 7 (seven) days., Disp: 4 capsule, Rfl: 6 .  promethazine (PHENERGAN) 25 MG tablet, Take 1 tablet (25 mg total) by mouth every 8 (eight) hours as needed for nausea or vomiting. (Patient not taking: Reported on 11/23/2016), Disp: 20 tablet, Rfl: 0  Review of Systems  Constitutional: Positive for fatigue (baseline. Pt states she does not snore, and she has never been told that she has apneic episodes) and unexpected weight change (gain). Negative for activity change, appetite  change, chills, diaphoresis and fever.  Respiratory: Negative for apnea.   Cardiovascular: Positive for leg swelling. Negative for chest pain and palpitations.  Gastrointestinal: Negative for abdominal pain.  Musculoskeletal: Positive for arthralgias, gait problem (due to foot pain) and myalgias (leg pain).  Neurological: Negative for dizziness, light-headedness, numbness and headaches.    Social History  Substance Use Topics  . Smoking status: Never Smoker  . Smokeless tobacco: Never Used  . Alcohol use No   Objective:   BP 132/72 (BP Location: Right Arm, Patient Position: Sitting, Cuff Size: Large)   Pulse 68   Temp 98.2 F (36.8 C) (Oral)   Resp 16   Wt 202 lb (91.6 kg)   BMI 40.80 kg/m  Vitals:   11/23/16 0807  BP: 132/72  Pulse: 68  Resp: 16  Temp: 98.2 F (36.8 C)  TempSrc: Oral  Weight: 202 lb (91.6 kg)     Physical Exam  Constitutional: She appears well-developed and well-nourished. No distress.  Neck: Normal range of motion. Neck supple. No tracheal deviation present. No thyromegaly present.  Cardiovascular: Normal rate, regular rhythm and normal heart sounds.  Exam reveals no gallop and no friction rub.   No murmur heard. Pulmonary/Chest: Effort normal and breath sounds normal. No respiratory distress. She has no wheezes. She has no rales.  Musculoskeletal:       Left ankle: She exhibits normal range of motion, no swelling, no ecchymosis and no deformity. Tenderness (over achilles tendon insertion on calcaneous). Achilles tendon exhibits  pain. Achilles tendon exhibits no defect and normal Thompson's test results.       Left foot: There is bony tenderness (left heel). There is normal range of motion, no tenderness, no swelling, normal capillary refill, no crepitus, no deformity and no laceration.  Lymphadenopathy:    She has no cervical adenopathy.  Skin: She is not diaphoretic.  Psychiatric: Her speech is normal and behavior is normal. Judgment and thought  content normal. Cognition and memory are normal. She exhibits a depressed mood.  Vitals reviewed.  Depression screen [facility] 2/9 11/23/2016  Decreased Interest 2  Down, Depressed, Hopeless 3  PHQ - 2 Score 5  Altered sleeping 3  Tired, decreased energy 3  Change in appetite 3  Feeling bad or failure about yourself  3  Trouble concentrating 1  Moving slowly or fidgety/restless 0  Suicidal thoughts 0  PHQ-9 Score 18   Epworth score was 5. No witnessed apneic episodes and patient has never been told she snores.    Assessment & Plan:     1. Achilles tendinitis of left lower extremity Worsening symptoms. Suspect achilles tendinitis and possibly early plantar fasciitis starting. Advised patient to use meloxicam as below. She is not to take [facility] powders or other NSAIDs with meloxicam, may use tylenol. Patient voices understanding. Discussed stretching exercises, heat to the achilles tendon, ice massages, biofreeze massages, and good shoes with good arch support. She voices understanding. She is to call if symptoms fail to improve.  - meloxicam (MOBIC) 15 MG tablet; Take 1 tablet (15 mg total) by mouth daily.  Dispense: 30 tablet; Refill: 0  2. Adult hypothyroidism Needs to have TSH checked but patient desires to wait at this time until her CPE.  3. BMI 40.0-44.9, adult ([place]) Counseled patient on healthy lifestyle modifications including dieting and exercise.   I spent approximately 35 minutes with the patient today. Over 50% of this time was spent with counseling and educating the patient about healthy dieting, limiting portion sizes and calorie counting.      Mar Daring, PA-C  [facility]

## 2016-12-30 ENCOUNTER — Telehealth: Payer: Self-pay | Admitting: Physician Assistant

## 2016-12-30 NOTE — Telephone Encounter (Signed)
Pt states she has ordered a weight loss medication.  The name is Rapidtone and it is all natural.  Pt is asking if it is ok to take?  CB#[phone]/MW

## 2016-12-30 NOTE — Telephone Encounter (Signed)
Pt informed and voiced understanding

## 2016-12-30 NOTE — Telephone Encounter (Signed)
So it seems it is overall safe.   Biggest things are that it can cause headache, flushing of the face, palpitations, visual changes, insomnia, dizziness, dry mouth, diarrhea, N/V, heartburn or cause a "fishy odor" of breath and/or sweat. These are just possible side effects of the individual ingredients.   The forksolin could interfere with your thyroid so we may need to monitor that a little closer. And some of the ingredients can cause elevated liver enzymes so may be beneficial to monitor that also.

## 2016-12-30 NOTE — Telephone Encounter (Signed)
Please advise. Thanks.  

## 2017-01-04 ENCOUNTER — Other Ambulatory Visit: Payer: Self-pay | Admitting: Physician Assistant

## 2017-01-04 DIAGNOSIS — Z780 Asymptomatic menopausal state: Secondary | ICD-10-CM

## 2017-01-04 MED ORDER — ESTRADIOL 1 MG PO TABS: 1.0000 mg | ORAL_TABLET | Freq: Every day | ORAL | 6 refills | Status: DC

## 2017-01-04 NOTE — Telephone Encounter (Signed)
[facility] faxed a request on the following medication. Thanks CC  estradiol (ESTRACE) 1 MG tablet  >Take 1 tablet by mouth once daily.

## 2017-04-08 ENCOUNTER — Encounter: Payer: BLUE CROSS/BLUE SHIELD | Admitting: Physician Assistant

## 2017-05-31 ENCOUNTER — Other Ambulatory Visit: Payer: Self-pay

## 2017-05-31 ENCOUNTER — Encounter: Payer: Self-pay | Admitting: Physician Assistant

## 2017-05-31 ENCOUNTER — Ambulatory Visit (INDEPENDENT_AMBULATORY_CARE_PROVIDER_SITE_OTHER): Payer: BLUE CROSS/BLUE SHIELD | Admitting: Physician Assistant

## 2017-05-31 VITALS — BP 128/70 | HR 62 | Temp 98.3°F | Resp 16 | Ht 59.0 in | Wt 203.0 lb

## 2017-05-31 DIAGNOSIS — F331 Major depressive disorder, recurrent, moderate: Secondary | ICD-10-CM | POA: Diagnosis not present

## 2017-05-31 DIAGNOSIS — Z90710 Acquired absence of both cervix and uterus: Secondary | ICD-10-CM

## 2017-05-31 DIAGNOSIS — Z114 Encounter for screening for human immunodeficiency virus [HIV]: Secondary | ICD-10-CM | POA: Diagnosis not present

## 2017-05-31 DIAGNOSIS — Z780 Asymptomatic menopausal state: Secondary | ICD-10-CM

## 2017-05-31 DIAGNOSIS — Z23 Encounter for immunization: Secondary | ICD-10-CM

## 2017-05-31 DIAGNOSIS — E039 Hypothyroidism, unspecified: Secondary | ICD-10-CM | POA: Diagnosis not present

## 2017-05-31 DIAGNOSIS — E7800 Pure hypercholesterolemia, unspecified: Secondary | ICD-10-CM | POA: Diagnosis not present

## 2017-05-31 DIAGNOSIS — Z1231 Encounter for screening mammogram for malignant neoplasm of breast: Secondary | ICD-10-CM | POA: Diagnosis not present

## 2017-05-31 DIAGNOSIS — E559 Vitamin D deficiency, unspecified: Secondary | ICD-10-CM

## 2017-05-31 DIAGNOSIS — Z6841 Body Mass Index (BMI) 40.0 and over, adult: Secondary | ICD-10-CM | POA: Diagnosis not present

## 2017-05-31 DIAGNOSIS — Z0000 Encounter for general adult medical examination without abnormal findings: Secondary | ICD-10-CM

## 2017-05-31 DIAGNOSIS — Z1239 Encounter for other screening for malignant neoplasm of breast: Secondary | ICD-10-CM

## 2017-05-31 NOTE — Patient Instructions (Signed)
Health Maintenance for Postmenopausal Women Menopause is a normal process in which your reproductive ability comes to an end. This process happens gradually over a span of months to years, usually between the ages of 22 and 9. Menopause is complete when you have missed 12 consecutive menstrual periods. It is important to talk with your health care provider about some of the most common conditions that affect postmenopausal women, such as heart disease, cancer, and bone loss (osteoporosis). Adopting a healthy lifestyle and getting preventive care can help to promote your health and wellness. Those actions can also lower your chances of developing some of these common conditions. What should I know about menopause? During menopause, you may experience a number of symptoms, such as:  Moderate-to-severe hot flashes.  Night sweats.  Decrease in sex drive.  Mood swings.  Headaches.  Tiredness.  Irritability.  Memory problems.  Insomnia.  Choosing to treat or not to treat menopausal changes is an individual decision that you make with your health care provider. What should I know about hormone replacement therapy and supplements? Hormone therapy products are effective for treating symptoms that are associated with menopause, such as hot flashes and night sweats. Hormone replacement carries certain risks, especially as you become older. If you are thinking about using estrogen or estrogen with progestin treatments, discuss the benefits and risks with your health care provider. What should I know about heart disease and stroke? Heart disease, heart attack, and stroke become more likely as you age. This may be due, in part, to the hormonal changes that your body experiences during menopause. These can affect how your body processes dietary fats, triglycerides, and cholesterol. Heart attack and stroke are both medical emergencies. There are many things that you can do to help prevent heart disease  and stroke:  Have your blood pressure checked at least every 1-2 years. High blood pressure causes heart disease and increases the risk of stroke.  If you are 53-22 years old, ask your health care provider if you should take aspirin to prevent a heart attack or a stroke.  Do not use any tobacco products, including cigarettes, chewing tobacco, or electronic cigarettes. If you need help quitting, ask your health care provider.  It is important to eat a healthy diet and maintain a healthy weight. ? Be sure to include plenty of vegetables, fruits, low-fat dairy products, and lean protein. ? Avoid eating foods that are high in solid fats, added sugars, or salt (sodium).  Get regular exercise. This is one of the most important things that you can do for your health. ? Try to exercise for at least 150 minutes each week. The type of exercise that you do should increase your heart rate and make you sweat. This is known as moderate-intensity exercise. ? Try to do strengthening exercises at least twice each week. Do these in addition to the moderate-intensity exercise.  Know your numbers.Ask your health care provider to check your cholesterol and your blood glucose. Continue to have your blood tested as directed by your health care provider.  What should I know about cancer screening? There are several types of cancer. Take the following steps to reduce your risk and to catch any cancer development as early as possible. Breast Cancer  Practice breast self-awareness. ? This means understanding how your breasts normally appear and feel. ? It also means doing regular breast self-exams. Let your health care provider know about any changes, no matter how small.  If you are 40  or older, have a clinician do a breast exam (clinical breast exam or CBE) every year. Depending on your age, family history, and medical history, it may be recommended that you also have a yearly breast X-ray (mammogram).  If you  have a family history of breast cancer, talk with your health care provider about genetic screening.  If you are at high risk for breast cancer, talk with your health care provider about having an MRI and a mammogram every year.  Breast cancer (BRCA) gene test is recommended for women who have family members with BRCA-related cancers. Results of the assessment will determine the need for genetic counseling and BRCA1 and for BRCA2 testing. BRCA-related cancers include these types: ? Breast. This occurs in males or females. ? Ovarian. ? Tubal. This may also be called fallopian tube cancer. ? Cancer of the abdominal or pelvic lining (peritoneal cancer). ? Prostate. ? Pancreatic.  Cervical, Uterine, and Ovarian Cancer Your health care provider may recommend that you be screened regularly for cancer of the pelvic organs. These include your ovaries, uterus, and vagina. This screening involves a pelvic exam, which includes checking for microscopic changes to the surface of your cervix (Pap test).  For women ages 21-65, health care providers may recommend a pelvic exam and a Pap test every three years. For women ages 79-65, they may recommend the Pap test and pelvic exam, combined with testing for human papilloma virus (HPV), every five years. Some types of HPV increase your risk of cervical cancer. Testing for HPV may also be done on women of any age who have unclear Pap test results.  Other health care providers may not recommend any screening for nonpregnant women who are considered low risk for pelvic cancer and have no symptoms. Ask your health care provider if a screening pelvic exam is right for you.  If you have had past treatment for cervical cancer or a condition that could lead to cancer, you need Pap tests and screening for cancer for at least 20 years after your treatment. If Pap tests have been discontinued for you, your risk factors (such as having a new sexual partner) need to be  reassessed to determine if you should start having screenings again. Some women have medical problems that increase the chance of getting cervical cancer. In these cases, your health care provider may recommend that you have screening and Pap tests more often.  If you have a family history of uterine cancer or ovarian cancer, talk with your health care provider about genetic screening.  If you have vaginal bleeding after reaching menopause, tell your health care provider.  There are currently no reliable tests available to screen for ovarian cancer.  Lung Cancer Lung cancer screening is recommended for adults 69-62 years old who are at high risk for lung cancer because of a history of smoking. A yearly low-dose CT scan of the lungs is recommended if you:  Currently smoke.  Have a history of at least 30 pack-years of smoking and you currently smoke or have quit within the past 15 years. A pack-year is smoking an average of one pack of cigarettes per day for one year.  Yearly screening should:  Continue until it has been 15 years since you quit.  Stop if you develop a health problem that would prevent you from having lung cancer treatment.  Colorectal Cancer  This type of cancer can be detected and can often be prevented.  Routine colorectal cancer screening usually begins at  age 42 and continues through age 45.  If you have risk factors for colon cancer, your health care provider may recommend that you be screened at an earlier age.  If you have a family history of colorectal cancer, talk with your health care provider about genetic screening.  Your health care provider may also recommend using home test kits to check for hidden blood in your stool.  A small camera at the end of a tube can be used to examine your colon directly (sigmoidoscopy or colonoscopy). This is done to check for the earliest forms of colorectal cancer.  Direct examination of the colon should be repeated every  5-10 years until age 71. However, if early forms of precancerous polyps or small growths are found or if you have a family history or genetic risk for colorectal cancer, you may need to be screened more often.  Skin Cancer  Check your skin from head to toe regularly.  Monitor any moles. Be sure to tell your health care provider: ? About any new moles or changes in moles, especially if there is a change in a mole's shape or color. ? If you have a mole that is larger than the size of a pencil eraser.  If any of your family members has a history of skin cancer, especially at a young age, talk with your health care provider about genetic screening.  Always use sunscreen. Apply sunscreen liberally and repeatedly throughout the day.  Whenever you are outside, protect yourself by wearing long sleeves, pants, a wide-brimmed hat, and sunglasses.  What should I know about osteoporosis? Osteoporosis is a condition in which bone destruction happens more quickly than new bone creation. After menopause, you may be at an increased risk for osteoporosis. To help prevent osteoporosis or the bone fractures that can happen because of osteoporosis, the following is recommended:  If you are 46-71 years old, get at least 1,000 mg of calcium and at least 600 mg of vitamin D per day.  If you are older than age 55 but younger than age 65, get at least 1,200 mg of calcium and at least 600 mg of vitamin D per day.  If you are older than age 54, get at least 1,200 mg of calcium and at least 800 mg of vitamin D per day.  Smoking and excessive alcohol intake increase the risk of osteoporosis. Eat foods that are rich in calcium and vitamin D, and do weight-bearing exercises several times each week as directed by your health care provider. What should I know about how menopause affects my mental health? Depression may occur at any age, but it is more common as you become older. Common symptoms of depression  include:  Low or sad mood.  Changes in sleep patterns.  Changes in appetite or eating patterns.  Feeling an overall lack of motivation or enjoyment of activities that you previously enjoyed.  Frequent crying spells.  Talk with your health care provider if you think that you are experiencing depression. What should I know about immunizations? It is important that you get and maintain your immunizations. These include:  Tetanus, diphtheria, and pertussis (Tdap) booster vaccine.  Influenza every year before the flu season begins.  Pneumonia vaccine.  Shingles vaccine.  Your health care provider may also recommend other immunizations. This information is not intended to replace advice given to you by your health care provider. Make sure you discuss any questions you have with your health care provider. Document Released: 07/24/2005  Document Revised: 12/20/2015 Document Reviewed: 03/05/2015 [employer] Interactive Patient Education  2018 [employer]  

## 2017-05-31 NOTE — Progress Notes (Signed)
Patient: Tammy Bailey, Female    DOB: 03-03-1956, 61 y.o.   MRN: [id number] Visit Date: 05/31/2017  Today's Provider: Mar Daring, PA-C   Chief Complaint  Patient presents with  . Annual Exam   Subjective:    Annual physical exam Tammy Bailey is a 61 y.o. female who presents today for health maintenance and complete physical. She feels fairly well. She reports exercising none. She reports she is sleeping fairly well.  04/07/16 CPE Pap:09/25/2011 Negative; Patient had a total hysterectomy. Mammogram:04/10/13 BI-RADS 1 Colonoscopy:08/15/2007 Normal FLU Vaccine: -----------------------------------------------------------------   Review of Systems  Constitutional: Positive for appetite change, fatigue and unexpected weight change.       Crying  HENT: Positive for trouble swallowing.   Eyes: Negative.   Respiratory: Positive for shortness of breath.   Cardiovascular: Positive for leg swelling.  Gastrointestinal: Positive for abdominal pain.  Endocrine: Positive for polydipsia.  Genitourinary: Negative.   Musculoskeletal: Positive for arthralgias.  Skin: Negative.   Allergic/Immunologic: Negative.   Neurological: Positive for headaches.  Psychiatric/Behavioral: Positive for sleep disturbance. The patient is nervous/anxious.   All other systems reviewed and are negative.   Social History      She  reports that  has never smoked. she has never used smokeless tobacco. She reports that she does not drink alcohol or use drugs.       Social History   Socioeconomic History  . Marital status: Divorced    Spouse name: None  . Number of children: None  . Years of education: None  . Highest education level: None  Social Needs  . Financial resource strain: None  . Food insecurity - worry: None  . Food insecurity - inability: None  . Transportation needs - medical: None  . Transportation needs - non-medical: None  Occupational History  . None  Tobacco  Use  . Smoking status: Never Smoker  . Smokeless tobacco: Never Used  Substance and Sexual Activity  . Alcohol use: No  . Drug use: No  . Sexual activity: None  Other Topics Concern  . None  Social History Narrative  . None    Past Medical History:  Diagnosis Date  . Anxiety   . Depression   . Hyperlipidemia   . Vitamin D deficiency      Patient Active Problem List   Diagnosis Date Noted  . History of esophageal stricture 04/05/2015  . Anxiety 04/02/2015  . Solitary cyst of breast 04/02/2015  . Clinical depression 04/02/2015  . Cannot sleep 04/02/2015  . Awareness of heartbeats 04/02/2015  . Post-menopausal 02/19/2015  . Vitamin D deficiency 01/03/2015  . Dermatologic disease 08/27/2009  . Allergic rhinitis 08/01/2009  . H/O total hysterectomy 07/12/2008  . Hypercholesterolemia without hypertriglyceridemia 01/11/2003  . Acid reflux 06/02/2002  . Adult hypothyroidism 04/21/2002    Past Surgical History:  Procedure Laterality Date  . ABDOMINAL HYSTERECTOMY    . APPENDECTOMY    . BLADDER SURGERY    . CARPAL TUNNEL RELEASE     Both  . CHOLECYSTECTOMY    . KNEE SURGERY Left x's two  . TONSILLECTOMY      Family History        Family Status  Relation Name Status  . Mother  Alive  . Father  Alive       MI  . Sister 1(yuonger) Alive  . Brother  Alive  . PGM  Deceased at age 26       Coronary  artery disease; lymphoma  . Sister 2 Alive        Her family history includes Diabetes in her sister; Hypertension in her father.     Allergies  Allergen Reactions  . Erythromycin      Current Outpatient Medications:  .  escitalopram (LEXAPRO) 10 MG tablet, Take 1 tablet (10 mg total) by mouth at bedtime., Disp: 90 tablet, Rfl: 1 .  estradiol (ESTRACE) 1 MG tablet, Take 1 tablet (1 mg total) by mouth daily., Disp: 30 tablet, Rfl: 6 .  levothyroxine (SYNTHROID, LEVOTHROID) 175 MCG tablet, Take 1 tablet (175 mcg total) by mouth daily before breakfast., Disp: 90  tablet, Rfl: 1 .  Vitamin D, Ergocalciferol, (DRISDOL) 50000 units CAPS capsule, Take 1 capsule (50,000 Units total) by mouth every 7 (seven) days., Disp: 4 capsule, Rfl: 6 .  Aspirin-Salicylamide-Caffeine (BC HEADACHE POWDER PO), Take 1 Package by mouth as needed., Disp: , Rfl:  .  meloxicam (MOBIC) 15 MG tablet, Take 1 tablet (15 mg total) by mouth daily. (Patient not taking: Reported on 05/31/2017), Disp: 30 tablet, Rfl: 0 .  promethazine (PHENERGAN) 25 MG tablet, Take 1 tablet (25 mg total) by mouth every 8 (eight) hours as needed for nausea or vomiting. (Patient not taking: Reported on 11/23/2016), Disp: 20 tablet, Rfl: 0   Patient Care Team: Mar Daring, PA-C as PCP - General (Family Medicine)      Objective:   Vitals: BP 128/70 (BP Location: Left Arm, Patient Position: Sitting, Cuff Size: Normal)   Pulse 62   Temp 98.3 F (36.8 C) (Oral)   Resp 16   Ht 4\' 11"  (1.499 m)   Wt 203 lb (92.1 kg)   BMI 41.00 kg/m    Physical Exam  Constitutional: She is oriented to person, place, and time. She appears well-developed and well-nourished. No distress.  HENT:  Head: Normocephalic and atraumatic.  Right Ear: External ear normal.  Left Ear: External ear normal.  Nose: Nose normal.  Mouth/Throat: Oropharynx is clear and moist. No oropharyngeal exudate.  Eyes: Conjunctivae and EOM are normal. Pupils are equal, round, and reactive to light. Right eye exhibits no discharge. Left eye exhibits no discharge. No scleral icterus.  Neck: Normal range of motion. Neck supple. No JVD present. No tracheal deviation present. No thyromegaly present.  Cardiovascular: Normal rate, regular rhythm, normal heart sounds and intact distal pulses. Exam reveals no gallop and no friction rub.  No murmur heard. Pulmonary/Chest: Effort normal and breath sounds normal. No respiratory distress. She has no wheezes. She has no rales. She exhibits no tenderness.  Abdominal: Soft. Bowel sounds are normal. She  exhibits no distension and no mass. There is no tenderness. There is no rebound and no guarding.  Musculoskeletal: Normal range of motion. She exhibits no edema or tenderness.  Lymphadenopathy:    She has no cervical adenopathy.  Neurological: She is alert and oriented to person, place, and time. She has normal reflexes.  Skin: Skin is warm and dry. No rash noted. She is not diaphoretic.  Psychiatric: She has a normal mood and affect. Her behavior is normal. Judgment and thought content normal.  Vitals reviewed.    Depression Screen PHQ 2/9 Scores 05/31/2017 11/23/2016  PHQ - 2 Score 6 5  PHQ- 9 Score 18 18      Assessment & Plan:     Routine Health Maintenance and Physical Exam  Exercise Activities and Dietary recommendations Goals    None      Immunization History  Administered Date(s) Administered  . Influenza,inj,Quad PF,6+ Mos 04/05/2015, 04/07/2016  . Td 04/03/2004  . Tdap 08/27/2009  . Zoster 05/19/2013    Health Maintenance  Topic Date Due  . HIV Screening  08/28/1969  . MAMMOGRAM  04/11/2015  . INFLUENZA VACCINE  01/13/2017  . COLONOSCOPY  08/14/2017  . TETANUS/TDAP  08/28/2019  . Hepatitis C Screening  Completed  . PAP SMEAR  Discontinued     Discussed health benefits of physical activity, and encouraged her to engage in regular exercise appropriate for her age and condition.  1. Annual physical exam Normal physical exam today. Will check labs as below and f/u pending lab results. If labs are stable and WNL she will not need to have these rechecked for one year at her next annual physical exam. She is to call the office in the meantime if she has any acute issue, questions or concerns.  2. Breast cancer screening Breast exam today was normal. There is no family history of breast cancer. She does perform regular self breast exams. Mammogram was ordered as below. Information for [facility] clinic was given to patient so she may schedule her  mammogram at her convenience. - MM DIGITAL SCREENING BILATERAL; Future  3. Moderate episode of recurrent major depressive disorder (HCC) On Lexapro 10mg  but not to goal. Patient does not wish to change therapy at this time. Feels it is secondary to her weight. Also discussed worsening fatigue and daytime somnolence. Discussed sleep study but patient wishes to defer at this time and will call after the new year if she desires this.   4. Adult hypothyroidism Previously stable on levothyroxine 157mcg. Will check labs as below and f/u pending results. - CBC with Differential/Platelet - Comprehensive metabolic panel - TSH  5. Hypercholesterolemia without hypertriglyceridemia Diet controlled. Patient has refused statins in the past. Will check labs as below and f/u pending results.  - CBC with Differential/Platelet - Comprehensive metabolic panel - Hemoglobin A1c  6. Vitamin D deficiency H/O this. On high dose supplementation. Will check labs as below and f/u pending results. - CBC with Differential/Platelet - Comprehensive metabolic panel - Vitamin D (25 hydroxy)  7. Post-menopausal - Vitamin D (25 hydroxy)  8. H/O total hysterectomy - Vitamin D (25 hydroxy)  9. BMI 40.0-44.9, adult ([place]) Discussed dieting and exercise options. Patient refuses to do a food diary. Discussed weight watchers. Discussed water aerobics.  - Hemoglobin A1c  10. Screening for HIV without presence of risk factors - HIV antibody  11. Need for influenza vaccination Flu vaccine given today without complication. Patient sat upright for 15 minutes to check for adverse reaction before being released. - Flu Vaccine QUAD 36+ mos IM --------------------------------------------------------------------    Mar Daring, PA-C  [facility]

## 2017-06-01 LAB — CBC WITH DIFFERENTIAL/PLATELET
Basophils Absolute: 39 cells/uL (ref 0–200)
Basophils Relative: 0.7 %
Eosinophils Absolute: 140 cells/uL (ref 15–500)
Eosinophils Relative: 2.5 %
HCT: 38 % (ref 35.0–45.0)
Hemoglobin: 13.3 g/dL (ref 11.7–15.5)
Lymphs Abs: 1590 cells/uL (ref 850–3900)
MCH: 29.9 pg (ref 27.0–33.0)
MCHC: 35 g/dL (ref 32.0–36.0)
MCV: 85.4 fL (ref 80.0–100.0)
MPV: 10.1 fL (ref 7.5–12.5)
Monocytes Relative: 12.9 %
Neutro Abs: 3108 cells/uL (ref 1500–7800)
Neutrophils Relative %: 55.5 %
Platelets: 337 10*3/uL (ref 140–400)
RBC: 4.45 10*6/uL (ref 3.80–5.10)
RDW: 12.1 % (ref 11.0–15.0)
Total Lymphocyte: 28.4 %
WBC mixed population: 722 cells/uL (ref 200–950)
WBC: 5.6 10*3/uL (ref 3.8–10.8)

## 2017-06-01 LAB — HIV ANTIBODY (ROUTINE TESTING W REFLEX): HIV 1&2 Ab, 4th Generation: NONREACTIVE

## 2017-06-01 LAB — HEMOGLOBIN A1C
Hgb A1c MFr Bld: 5.4 % of total Hgb (ref <5.7)
Mean Plasma Glucose: 108 (calc)
eAG (mmol/L): 6 (calc)

## 2017-06-01 LAB — COMPLETE METABOLIC PANEL WITH GFR
AG Ratio: 1.4 (calc) (ref 1.0–2.5)
ALT: 14 U/L (ref 6–29)
AST: 15 U/L (ref 10–35)
Albumin: 3.9 g/dL (ref 3.6–5.1)
Alkaline phosphatase (APISO): 94 U/L (ref 33–130)
BUN: 15 mg/dL (ref 7–25)
CO2: 25 mmol/L (ref 20–32)
Calcium: 9.3 mg/dL (ref 8.6–10.4)
Chloride: 107 mmol/L (ref 98–110)
Creat: 0.58 mg/dL (ref 0.50–0.99)
GFR, Est African American: 114 mL/min/{1.73_m2} (ref >=60)
GFR, Est Non African American: 99 mL/min/{1.73_m2} (ref >=60)
Globulin: 2.8 g/dL (calc) (ref 1.9–3.7)
Glucose, Bld: 94 mg/dL (ref 65–99)
Potassium: 4.2 mmol/L (ref 3.5–5.3)
Sodium: 140 mmol/L (ref 135–146)
Total Bilirubin: 0.5 mg/dL (ref 0.2–1.2)
Total Protein: 6.7 g/dL (ref 6.1–8.1)

## 2017-06-01 LAB — TSH: TSH: 0.02 mIU/L — ABNORMAL LOW (ref 0.40–4.50)

## 2017-06-01 LAB — VITAMIN D 25 HYDROXY (VIT D DEFICIENCY, FRACTURES): Vit D, 25-Hydroxy: 25 ng/mL — ABNORMAL LOW (ref 30–100)

## 2017-06-02 ENCOUNTER — Telehealth: Payer: Self-pay | Admitting: Physician Assistant

## 2017-06-02 DIAGNOSIS — E039 Hypothyroidism, unspecified: Secondary | ICD-10-CM

## 2017-06-02 DIAGNOSIS — E559 Vitamin D deficiency, unspecified: Secondary | ICD-10-CM

## 2017-06-02 MED ORDER — VITAMIN D (ERGOCALCIFEROL) 1.25 MG (50000 UNIT) PO CAPS: 50000.0000 [IU] | ORAL_CAPSULE | ORAL | 1 refills | Status: DC

## 2017-06-02 MED ORDER — LEVOTHYROXINE SODIUM 150 MCG PO TABS: 150.0000 ug | ORAL_TABLET | Freq: Every day | ORAL | 3 refills | Status: DC

## 2017-06-02 NOTE — Telephone Encounter (Signed)
Pt advised. Pt does not have the vitamin D 50,000 unit  Tablets. She was taking the daily supplement. She will need that sent in. Thanks.

## 2017-06-02 NOTE — Telephone Encounter (Signed)
please advise.

## 2017-06-02 NOTE — Telephone Encounter (Signed)
Sent in

## 2017-06-02 NOTE — Telephone Encounter (Signed)
Change in dose to levothyroxine 165mcg due to TSH being overcorrected. This has been sent in and recommend rechecking TSH in 6-8 weeks to see if this dose more appropriate.

## 2017-06-02 NOTE — Telephone Encounter (Signed)
LMTCB

## 2017-06-02 NOTE — Telephone Encounter (Signed)
Pt contacted office for refill request on the following medications:  levothyroxine (SYNTHROID, LEVOTHROID) 175 MCG tablet   Pt is asking if the dosage will stay the same due to having her lab work done this week.  [facility].  CB#[phone]/MW

## 2017-08-03 ENCOUNTER — Telehealth: Payer: Self-pay | Admitting: Physician Assistant

## 2017-08-03 NOTE — Telephone Encounter (Signed)
LMTCB-Per Tammy Bailey is patient preference to take the Alka Seltzer Plus Cold and Flu since her blood pressure is WNL.She does states she prefers Coricidin or Mucinex DM instead.  Thanks,  -Joseline

## 2017-08-03 NOTE — Telephone Encounter (Signed)
Patient advised.cbe °

## 2017-08-03 NOTE — Telephone Encounter (Signed)
Patient bought [place] flu yesterday to take.  She has a thyroid condition and needs to know if this will be ok for her today for a "cold".

## 2017-08-19 ENCOUNTER — Ambulatory Visit: Payer: BLUE CROSS/BLUE SHIELD | Admitting: Physician Assistant

## 2017-08-19 ENCOUNTER — Encounter: Payer: Self-pay | Admitting: Physician Assistant

## 2017-08-19 VITALS — BP 117/74 | HR 72 | Temp 98.1°F | Resp 16 | Wt 205.0 lb

## 2017-08-19 DIAGNOSIS — E559 Vitamin D deficiency, unspecified: Secondary | ICD-10-CM | POA: Diagnosis not present

## 2017-08-19 DIAGNOSIS — E039 Hypothyroidism, unspecified: Secondary | ICD-10-CM | POA: Diagnosis not present

## 2017-08-19 DIAGNOSIS — N3001 Acute cystitis with hematuria: Secondary | ICD-10-CM | POA: Diagnosis not present

## 2017-08-19 LAB — POCT URINALYSIS DIPSTICK
Appearance: ABNORMAL
Bilirubin, UA: NEGATIVE
Glucose, UA: NEGATIVE
Ketones, UA: NEGATIVE
Nitrite, UA: NEGATIVE
Spec Grav, UA: 1.025 (ref 1.010–1.025)
Urobilinogen, UA: 0.2 E.U./dL
pH, UA: 6 (ref 5.0–8.0)

## 2017-08-19 MED ORDER — SULFAMETHOXAZOLE-TRIMETHOPRIM 800-160 MG PO TABS: 1.0000 | ORAL_TABLET | Freq: Two times a day (BID) | ORAL | 0 refills | Status: DC

## 2017-08-19 MED ORDER — PHENAZOPYRIDINE HCL 200 MG PO TABS: 200.0000 mg | ORAL_TABLET | Freq: Three times a day (TID) | ORAL | 0 refills | Status: DC | PRN

## 2017-08-19 NOTE — Progress Notes (Signed)
Patient: Tammy Bailey Female    DOB: 08-02-1955   62 y.o.   MRN: [id number] Visit Date: 08/19/2017  Today's Provider: Mar Daring, PA-C   Chief Complaint  Patient presents with  . Urinary Tract Infection   Subjective:    Urinary Tract Infection   This is a new problem. The current episode started in the past 7 days. The problem occurs every urination. The problem has been gradually worsening. There has been no fever. There is no history of pyelonephritis. Associated symptoms include frequency, hesitancy and urgency. Pertinent negatives include no chills, discharge, flank pain, hematuria, nausea, possible pregnancy, sweats or vomiting. She has tried home medications and increased fluids for the symptoms. The treatment provided no relief.      Allergies  Allergen Reactions  . Erythromycin      Current Outpatient Medications:  .  Aspirin-Salicylamide-Caffeine (BC HEADACHE POWDER PO), Take 1 Package by mouth as needed., Disp: , Rfl:  .  estradiol (ESTRACE) 1 MG tablet, Take 1 tablet (1 mg total) by mouth daily., Disp: 30 tablet, Rfl: 6 .  levothyroxine (SYNTHROID, LEVOTHROID) 150 MCG tablet, Take 1 tablet (150 mcg total) by mouth daily., Disp: 90 tablet, Rfl: 3 .  meloxicam (MOBIC) 15 MG tablet, Take 1 tablet (15 mg total) by mouth daily., Disp: 30 tablet, Rfl: 0 .  promethazine (PHENERGAN) 25 MG tablet, Take 1 tablet (25 mg total) by mouth every 8 (eight) hours as needed for nausea or vomiting., Disp: 20 tablet, Rfl: 0 .  Vitamin D, Ergocalciferol, (DRISDOL) 50000 units CAPS capsule, Take 1 capsule (50,000 Units total) by mouth every 7 (seven) days., Disp: 12 capsule, Rfl: 1 .  escitalopram (LEXAPRO) 10 MG tablet, Take 1 tablet (10 mg total) by mouth at bedtime., Disp: 90 tablet, Rfl: 1  Review of Systems  Constitutional: Positive for fatigue. Negative for activity change, appetite change, chills, diaphoresis, fever and unexpected weight change.  Respiratory:  Negative.   Cardiovascular: Negative.   Gastrointestinal: Negative.  Negative for abdominal pain, nausea and vomiting.  Genitourinary: Positive for difficulty urinating, dysuria, frequency, hesitancy and urgency. Negative for decreased urine volume, flank pain, hematuria, vaginal bleeding, vaginal discharge and vaginal pain.  Musculoskeletal: Negative for back pain.  Neurological: Negative.     Social History   Tobacco Use  . Smoking status: Never Smoker  . Smokeless tobacco: Never Used  Substance Use Topics  . Alcohol use: No   Objective:   BP 117/74 (BP Location: Left Arm, Patient Position: Sitting, Cuff Size: Large)   Pulse 72   Temp 98.1 F (36.7 C) (Oral)   Resp 16   Wt 205 lb (93 kg)   BMI 41.40 kg/m    Physical Exam  Constitutional: She is oriented to person, place, and time. She appears well-developed and well-nourished. No distress.  Cardiovascular: Normal rate, regular rhythm and normal heart sounds. Exam reveals no gallop and no friction rub.  No murmur heard. Pulmonary/Chest: Effort normal and breath sounds normal. No respiratory distress. She has no wheezes. She has no rales.  Abdominal: Soft. Normal appearance and bowel sounds are normal. She exhibits no distension and no mass. There is no hepatosplenomegaly. There is tenderness in the suprapubic area. There is no rebound, no guarding and no CVA tenderness.  Neurological: She is alert and oriented to person, place, and time.  Skin: Skin is warm and dry. She is not diaphoretic.  Vitals reviewed.      Assessment & Plan:  1. Acute cystitis with hematuria Worsening symptoms. UA positive. Will treat empirically with Bactrim as below. Pyridium given for spasm. Continue to push fluids. Urine sent for culture. Will follow up pending C&S results. She is to call if symptoms do not improve or if they worsen.  - sulfamethoxazole-trimethoprim (BACTRIM DS,SEPTRA DS) 800-160 MG tablet; Take 1 tablet by mouth 2 (two) times  daily.  Dispense: 20 tablet; Refill: 0 - phenazopyridine (PYRIDIUM) 200 MG tablet; Take 1 tablet (200 mg total) by mouth 3 (three) times daily as needed for pain.  Dispense: 10 tablet; Refill: 0 - Urine Culture  2. Acquired hypothyroidism Time to recheck and make sure levothyroxine dose appropriate. Will check labs as below and f/u pending results. - TSH  3. Vitamin D deficiency Continue high dose Vit D supplementation. Will check labs as below and f/u pending results.  - Vitamin D (25 hydroxy)       Mar Daring, PA-C  [facility]

## 2017-08-19 NOTE — Patient Instructions (Signed)

## 2017-08-19 NOTE — Addendum Note (Signed)
Addended by: Mar Daring on: 08/19/2017 10:29 AM   Modules accepted: Orders

## 2017-08-20 LAB — VITAMIN D 25 HYDROXY (VIT D DEFICIENCY, FRACTURES): Vit D, 25-Hydroxy: 25.1 ng/mL — ABNORMAL LOW (ref 30.0–100.0)

## 2017-08-20 LAB — TSH: TSH: 0.283 u[IU]/mL — ABNORMAL LOW (ref 0.450–4.500)

## 2017-08-21 LAB — URINE CULTURE

## 2017-08-23 ENCOUNTER — Telehealth: Payer: Self-pay | Admitting: *Deleted

## 2017-08-23 NOTE — Telephone Encounter (Signed)
-----   Message from Mar Daring, PA-C sent at 08/23/2017  1:23 PM EDT ----- Urine culture positive for e.coli. It is susceptible to bactrim so continue until completed. Thyroid lab and Vit D essentially unchanged but since you are feeling better lets continue current dose and continue current Vit D and recheck in 3-6 months.

## 2017-08-23 NOTE — Telephone Encounter (Signed)
LMOVM for pt to return call 

## 2017-08-23 NOTE — Telephone Encounter (Signed)
Patient was advised notified of results. Expressed understanding.

## 2017-10-08 ENCOUNTER — Ambulatory Visit: Payer: BLUE CROSS/BLUE SHIELD | Admitting: Physician Assistant

## 2017-10-08 ENCOUNTER — Encounter: Payer: Self-pay | Admitting: Physician Assistant

## 2017-10-08 VITALS — BP 128/76 | Temp 98.0°F | Resp 16 | Wt 203.0 lb

## 2017-10-08 DIAGNOSIS — M7662 Achilles tendinitis, left leg: Secondary | ICD-10-CM | POA: Diagnosis not present

## 2017-10-08 MED ORDER — METHYLPREDNISOLONE 4 MG PO TBPK: ORAL_TABLET | ORAL | 0 refills | Status: DC

## 2017-10-08 NOTE — Patient Instructions (Signed)
Achilles Tendinitis Achilles tendinitis is inflammation of the tough, cord-like band that attaches the lower leg muscles to the heel bone (Achilles tendon). This is usually caused by overusing the tendon and the ankle joint. Achilles tendinitis usually gets better over time with treatment and caring for yourself at home. It can take weeks or months to heal completely. What are the causes? This condition may be caused by:  A sudden increase in exercise or activity, such as running.  Doing the same exercises or activities (such as jumping) over and over.  Not warming up calf muscles before exercising.  Exercising in shoes that are worn out or not made for exercise.  Having arthritis or a bone growth (spur) on the back of the heel bone. This can rub against the tendon and hurt it.  Age-related wear and tear. Tendons become less flexible with age and more likely to be injured.  What are the signs or symptoms? Common symptoms of this condition include:  Pain in the Achilles tendon or in the back of the leg, just above the heel. The pain usually gets worse with exercise.  Stiffness or soreness in the back of the leg, especially in the morning.  Swelling of the skin over the Achilles tendon.  Thickening of the tendon.  Bone spurs at the bottom of the Achilles tendon, near the heel.  Trouble standing on tiptoe.  How is this diagnosed? This condition is diagnosed based on your symptoms and a physical exam. You may have tests, including:  X-rays.  MRI.  How is this treated? The goal of treatment is to relieve symptoms and help your injury heal. Treatment may include:  Decreasing or stopping activities that caused the tendinitis. This may mean switching to low-impact exercises like biking or swimming.  Icing the injured area.  Doing physical therapy, including strengthening and stretching exercises.  NSAIDs to help relieve pain and swelling.  Using supportive shoes, wraps,  heel lifts, or a walking boot (air cast).  Surgery. This may be done if your symptoms do not improve after 6 months.  Using high-energy shock wave impulses to stimulate the healing process (extracorporeal shock wave therapy). This is rare.  Injection of medicines to help relieve inflammation (corticosteroids). This is rare.  Follow these instructions at home: If you have an air cast:  Wear the cast as told by your health care provider. Remove it only as told by your health care provider.  Loosen the cast if your toes tingle, become numb, or turn cold and blue. Activity  Gradually return to your normal activities once your health care provider approves. Do not do activities that cause pain. ? Consider doing low-impact exercises, like cycling or swimming.  If you have an air cast, ask your health care provider when it is safe for you to drive.  If physical therapy was prescribed, do exercises as told by your health care provider or physical therapist. Managing pain, stiffness, and swelling  Raise (elevate) your foot above the level of your heart while you are sitting or lying down.  Move your toes often to avoid stiffness and to lessen swelling.  If directed, put ice on the injured area: ? Put ice in a plastic bag. ? Place a towel between your skin and the bag. ? Leave the ice on for 20 minutes, 2-3 times a day General instructions  If directed, wrap your foot with an elastic bandage or other wrap. This can help keep your tendon from moving too much  while it heals. Your health care provider will show you how to wrap your foot correctly.  Wear supportive shoes or heel lifts only as told by your health care provider.  Take over-the-counter and prescription medicines only as told by your health care provider.  Keep all follow-up visits as told by your health care provider. This is important. Contact a health care provider if:  You have symptoms that gets worse.  You have pain  that does not get better with medicine.  You develop new, unexplained symptoms.  You develop warmth and swelling in your foot.  You have a fever. Get help right away if:  You have a sudden popping sound or sensation in your Achilles tendon followed by severe pain.  You cannot move your toes or foot.  You cannot put any weight on your foot. Summary  Achilles tendinitis is inflammation of the tough, cord-like band that attaches the lower leg muscles to the heel bone (Achilles tendon).  This condition is usually caused by overusing the tendon and the ankle joint. It can also be caused by arthritis or normal aging.  The most common symptoms of this condition include pain, swelling, or stiffness in the Achilles tendon or in the back of the leg.  This condition is usually treated with rest, NSAIDs, and physical therapy. This information is not intended to replace advice given to you by your health care provider. Make sure you discuss any questions you have with your health care provider. Document Released: 03/11/2005 Document Revised: 04/20/2016 Document Reviewed: 04/20/2016 Elsevier Interactive Patient Education  2017 [employer]   Achilles Tendinitis Rehab Ask your health care provider which exercises are safe for you. Do exercises exactly as told by your health care provider and adjust them as directed. It is normal to feel mild stretching, pulling, tightness, or discomfort as you do these exercises, but you should stop right away if you feel sudden pain or your pain gets worse. Do not begin these exercises until told by your health care provider. Stretching and range of motion exercises These exercises warm up your muscles and joints and improve the movement and flexibility of your ankle. These exercises also help to relieve pain, numbness, and tingling. Exercise A: Standing wall calf stretch, knee straight  1. Stand with your hands against a wall. 2. Extend your __________ leg  behind you and bend your front knee slightly. Keep both of your heels on the floor. 3. Point the toes of your back foot slightly inward. 4. Keeping your heels on the floor and your back knee straight, shift your weight toward the wall. Do not allow your back to arch. You should feel a gentle stretch in your calf. 5. Hold this position for seconds. Repeat __________ times. Complete this stretch __________ times per day. Exercise B: Standing wall calf stretch, knee bent 1. Stand with your hands against a wall. 2. Extend your __________ leg behind you, and bend your front knee slightly. Keep both of your heels on the floor. 3. Point the toes of your back foot slightly inward. 4. Keeping your heels on the floor, unlock your back knee so that it is bent. You should feel a gentle stretch deep in your calf. 5. Hold this position for __________ seconds. Repeat __________ times. Complete this stretch __________ times per day. Strengthening exercises These exercises build strength and control of your ankle. Endurance is the ability to use your muscles for a long time, even after they get tired. Exercise C:  Plantar flexion with band  1. Sit on the floor with your __________ leg extended. You may put a pillow under your calf to give your foot more room to move. 2. Loop a rubber exercise band or tube around the ball of your __________ foot. The ball of your foot is on the walking surface, right under your toes. The band or tube should be slightly tense when your foot is relaxed. If the band or tube slips, you can put on your shoe or put a washcloth between the band and your foot to help it stay in place. 3. Slowly point your toes downward, pushing them away from you. 4. Hold this position for __________ seconds. 5. Slowly release the tension in the band or tube, controlling smoothly until your foot is back to the starting position. Repeat __________ times. Complete this exercise __________ times per  day. Exercise D: Heel raise with eccentric lower  1. Stand on a step with the balls of your feet. The ball of your foot is on the walking surface, right under your toes. ? Do not put your heels on the step. ? For balance, rest your hands on the wall or on a railing. 2. Rise up onto the balls of your feet. 3. Keeping your heels up, shift all of your weight to your __________ leg and pick up your other leg. 4. Slowly lower your __________ leg so your heel drops below the level of the step. 5. Put down your foot. If told by your health care provider, build up to:  3 sets of 15 repetitions while keeping your knees straight.  3 sets of 15 repetitions while keeping your knees bent as far as told by your health care provider.  Complete this exercise __________ times per day. If this exercise is too easy, try doing it while wearing a backpack with weights in it. Balance exercises These exercises improve or maintain your balance. Balance is important in preventing falls. Exercise E: Single leg stand 1. Without shoes, stand near a railing or in a door frame. Hold on to the railing or door frame as needed. 2. Stand on your __________ foot. Keep your big toe down on the floor and try to keep your arch lifted. 3. Hold this position for __________ seconds. Repeat __________ times. Complete this exercise __________ times per day. If this exercise is too easy, you can try it with your eyes closed or while standing on a pillow. This information is not intended to replace advice given to you by your health care provider. Make sure you discuss any questions you have with your health care provider. Document Released: 12/31/2004 Document Revised: 02/06/2016 Document Reviewed: 02/05/2015 Elsevier Interactive Patient Education  [employer].

## 2017-10-08 NOTE — Progress Notes (Signed)
Patient: Tammy Bailey Female    DOB: 1955-12-22   62 y.o.   MRN: [id number] Visit Date: 10/08/2017  Today's Provider: Mar Daring, PA-C   Chief Complaint  Patient presents with  . Foot Pain   Subjective:    HPI Patient comes in today c/o pain in her left foot. She reports that it has been ongoing for several months. She reports that the pain starts in the arch of her foot, then radiates up her ankle. She has not taken anything OTC for her symptoms.     Allergies  Allergen Reactions  . Erythromycin      Current Outpatient Medications:  .  Aspirin-Salicylamide-Caffeine (BC HEADACHE POWDER PO), Take 1 Package by mouth as needed., Disp: , Rfl:  .  escitalopram (LEXAPRO) 10 MG tablet, Take 1 tablet (10 mg total) by mouth at bedtime., Disp: 90 tablet, Rfl: 1 .  estradiol (ESTRACE) 1 MG tablet, Take 1 tablet (1 mg total) by mouth daily., Disp: 30 tablet, Rfl: 6 .  levothyroxine (SYNTHROID, LEVOTHROID) 150 MCG tablet, Take 1 tablet (150 mcg total) by mouth daily., Disp: 90 tablet, Rfl: 3 .  meloxicam (MOBIC) 15 MG tablet, Take 1 tablet (15 mg total) by mouth daily., Disp: 30 tablet, Rfl: 0 .  phenazopyridine (PYRIDIUM) 200 MG tablet, Take 1 tablet (200 mg total) by mouth 3 (three) times daily as needed for pain., Disp: 10 tablet, Rfl: 0 .  promethazine (PHENERGAN) 25 MG tablet, Take 1 tablet (25 mg total) by mouth every 8 (eight) hours as needed for nausea or vomiting., Disp: 20 tablet, Rfl: 0 .  Vitamin D, Ergocalciferol, (DRISDOL) 50000 units CAPS capsule, Take 1 capsule (50,000 Units total) by mouth every 7 (seven) days., Disp: 12 capsule, Rfl: 1 .  sulfamethoxazole-trimethoprim (BACTRIM DS,SEPTRA DS) 800-160 MG tablet, Take 1 tablet by mouth 2 (two) times daily. (Patient not taking: Reported on 10/08/2017), Disp: 20 tablet, Rfl: 0  Review of Systems  Constitutional: Positive for activity change and fatigue.  Respiratory: Negative.   Cardiovascular: Negative for chest  pain, palpitations and leg swelling.  Musculoskeletal: Positive for arthralgias, joint swelling and myalgias. Negative for back pain and gait problem.  Neurological: Negative for tremors, weakness and numbness.    Social History   Tobacco Use  . Smoking status: Never Smoker  . Smokeless tobacco: Never Used  Substance Use Topics  . Alcohol use: No   Objective:   BP 128/76 (BP Location: Left Arm, Patient Position: Sitting, Cuff Size: Large)   Temp 98 F (36.7 C)   Resp 16   Wt 203 lb (92.1 kg)   BMI 41.00 kg/m  Vitals:   10/08/17 1021  BP: 128/76  Resp: 16  Temp: 98 F (36.7 C)  Weight: 203 lb (92.1 kg)     Physical Exam  Constitutional: She appears well-developed and well-nourished. No distress.  Neck: Normal range of motion. Neck supple.  Cardiovascular: Normal rate, regular rhythm and normal heart sounds. Exam reveals no gallop and no friction rub.  No murmur heard. Pulmonary/Chest: Effort normal and breath sounds normal. No respiratory distress. She has no wheezes. She has no rales.  Musculoskeletal:       Left ankle: She exhibits swelling. She exhibits normal range of motion and normal pulse. Tenderness (calcaneus). Achilles tendon exhibits pain. Achilles tendon exhibits no defect and normal Thompson's test results.       Left foot: There is normal range of motion.  Feet:  Skin: She is not diaphoretic.  Vitals reviewed.       Assessment & Plan:     1. Achilles tendinitis of left lower extremity Discussed conservative therapies. RICE. Exercises and stretches given on AVS. Medrol dose pak given as below. Patient declines ever wanting a steroid shot in heel thus no referral placed yet. She is to call if symptoms worsen or fail to improve.  methylPREDNISolone (MEDROL) 4 MG TBPK tablet; 6 day taper; take as directed on package instructions  Dispense: 21 tablet; Refill: 0       Mar Daring, PA-C  [facility]

## 2017-10-23 ENCOUNTER — Other Ambulatory Visit: Payer: Self-pay | Admitting: Physician Assistant

## 2017-10-23 DIAGNOSIS — E559 Vitamin D deficiency, unspecified: Secondary | ICD-10-CM

## 2017-11-18 ENCOUNTER — Telehealth: Payer: Self-pay | Admitting: Physician Assistant

## 2017-11-18 DIAGNOSIS — M722 Plantar fascial fibromatosis: Secondary | ICD-10-CM

## 2017-11-18 DIAGNOSIS — M7662 Achilles tendinitis, left leg: Secondary | ICD-10-CM

## 2017-11-18 NOTE — Telephone Encounter (Signed)
Pt called wanting a referral to a podiatrist.  She seen you for her foot pain in April and talked about a referral at that visit.    She has [employer]   Pt's call back is [phone]  Thanks teri

## 2017-11-19 NOTE — Telephone Encounter (Signed)
Referral placed.

## 2018-01-11 ENCOUNTER — Ambulatory Visit: Payer: Self-pay | Admitting: Podiatry

## 2018-01-14 ENCOUNTER — Other Ambulatory Visit: Payer: Self-pay | Admitting: Physician Assistant

## 2018-01-14 DIAGNOSIS — Z780 Asymptomatic menopausal state: Secondary | ICD-10-CM

## 2018-01-14 NOTE — Telephone Encounter (Signed)
Pt requesting refill of Estradiol 1 Mg sent to [facility].  Pt states she is completely out of medication.

## 2018-01-18 ENCOUNTER — Encounter: Payer: Self-pay | Admitting: Podiatry

## 2018-01-18 ENCOUNTER — Ambulatory Visit (INDEPENDENT_AMBULATORY_CARE_PROVIDER_SITE_OTHER): Payer: BLUE CROSS/BLUE SHIELD | Admitting: Podiatry

## 2018-01-18 ENCOUNTER — Ambulatory Visit (INDEPENDENT_AMBULATORY_CARE_PROVIDER_SITE_OTHER): Payer: BLUE CROSS/BLUE SHIELD

## 2018-01-18 DIAGNOSIS — M7662 Achilles tendinitis, left leg: Secondary | ICD-10-CM

## 2018-01-18 MED ORDER — METHYLPREDNISOLONE 4 MG PO TBPK: ORAL_TABLET | ORAL | 0 refills | Status: DC

## 2018-01-18 MED ORDER — MELOXICAM 15 MG PO TABS: 15.0000 mg | ORAL_TABLET | Freq: Every day | ORAL | 1 refills | Status: AC

## 2018-01-20 ENCOUNTER — Telehealth: Payer: Self-pay | Admitting: Podiatry

## 2018-01-20 NOTE — Telephone Encounter (Signed)
I returned patient call, she had questions on how to take the Medrol dose pack because no instructions were on the bottle.  Instructions were given to patient and she verbalized understanding

## 2018-01-20 NOTE — Telephone Encounter (Signed)
I was in there on Tuesday and I was prescribed some medicine. It's not on the bottle, and I'm not exactly sure how I was supposed to take it the first several days. Please call me back at [phone]. Thank you.

## 2018-01-21 NOTE — Progress Notes (Signed)
   HPI: 62 year old female presenting today with a chief complaint of constant burning pain and tightness of the left foot and heel that began 2-3 months ago. She reports associated swelling and a nodule to the posterior leg around the achilles tendon. Walking and standing exacerbate the symptoms. She has been stretching the area, applying ice therapy and taking BC Powder. Patient is here for further evaluation and treatment.   Past Medical History:  Diagnosis Date  . Anxiety   . Depression   . Hyperlipidemia   . Vitamin D deficiency       Physical Exam: General: The patient is alert and oriented x3 in no acute distress.  Dermatology: Skin is warm, dry and supple bilateral lower extremities. Negative for open lesions or macerations.  Vascular: Palpable pedal pulses bilaterally. No edema or erythema noted. Capillary refill within normal limits.  Neurological: Epicritic and protective threshold grossly intact bilaterally.   Musculoskeletal Exam: Pain on palpation noted to the posterior tubercle of the left calcaneus at the insertion of the Achilles tendon consistent with retrocalcaneal bursitis. Range of motion within normal limits. Muscle strength 5/5 in all muscle groups bilateral lower extremities.  Radiographic Exam:  Posterior calcaneal spur noted to the respective calcaneus on lateral view. No fracture or dislocation noted. Normal osseous mineralization noted.     Assessment: 1. Achilles tendinitis left 2. Retrocalcaneal bursitis   Plan of Care:  1. Patient was evaluated. Radiographs were reviewed today. 2. Prescription for Medrol Dose Pak provided to patient.  3. Prescription for Meloxicam provided to patient.  4. Silicone heel sleeve dispensed.  5. Return to clinic in 4 weeks.   Edrick Kins, DPM [facility]  Dr. Edrick Kins, [address]                                        [place], [place] [address]                Office [phone]  Fax [phone]

## 2018-02-15 ENCOUNTER — Ambulatory Visit (INDEPENDENT_AMBULATORY_CARE_PROVIDER_SITE_OTHER): Payer: BLUE CROSS/BLUE SHIELD | Admitting: Podiatry

## 2018-02-15 ENCOUNTER — Encounter: Payer: Self-pay | Admitting: Podiatry

## 2018-02-15 DIAGNOSIS — M7662 Achilles tendinitis, left leg: Secondary | ICD-10-CM

## 2018-02-18 NOTE — Progress Notes (Signed)
   HPI: 62 year old female presenting today for follow up evaluation of achilles tendinitis of the left lower extremity. She states she is improving. She reports some continued swelling but states the knot on the achilles looks better. She has been wearing the heel sleeve with some relief. Patient is here for further evaluation and treatment.    Past Medical History:  Diagnosis Date  . Anxiety   . Depression   . Hyperlipidemia   . Vitamin D deficiency       Physical Exam: General: The patient is alert and oriented x3 in no acute distress.  Dermatology: Skin is warm, dry and supple bilateral lower extremities. Negative for open lesions or macerations.  Vascular: Palpable pedal pulses bilaterally. No edema or erythema noted. Capillary refill within normal limits.  Neurological: Epicritic and protective threshold grossly intact bilaterally.   Musculoskeletal Exam: Pain on palpation noted to the posterior tubercle of the left calcaneus at the insertion of the Achilles tendon consistent with retrocalcaneal bursitis. Range of motion within normal limits. Muscle strength 5/5 in all muscle groups bilateral lower extremities.  Assessment: 1. Achilles tendinitis left - improved   Plan of Care:  1. Patient was evaluated.  2. Continue taking Meloxicam as needed.  3. Continue wearing heel sleeve.  4. Stressed importance of stretching to alleviate symptoms.  5. Return to clinic as needed if patient needs more aggressive treatment.   Goes by Tammy Bailey.    Edrick Kins, DPM [facility]  Dr. Edrick Kins, [address]                                        [place], [place] [address]                Office [phone]  Fax [phone]

## 2018-06-02 ENCOUNTER — Encounter: Payer: BLUE CROSS/BLUE SHIELD | Admitting: Physician Assistant

## 2018-06-03 ENCOUNTER — Encounter: Payer: Self-pay | Admitting: Physician Assistant

## 2018-06-03 ENCOUNTER — Ambulatory Visit (INDEPENDENT_AMBULATORY_CARE_PROVIDER_SITE_OTHER): Payer: BLUE CROSS/BLUE SHIELD | Admitting: Physician Assistant

## 2018-06-03 VITALS — BP 143/83 | HR 73 | Temp 97.7°F | Wt 204.6 lb

## 2018-06-03 DIAGNOSIS — E039 Hypothyroidism, unspecified: Secondary | ICD-10-CM

## 2018-06-03 DIAGNOSIS — E66813 Obesity, class 3: Secondary | ICD-10-CM

## 2018-06-03 DIAGNOSIS — Z6841 Body Mass Index (BMI) 40.0 and over, adult: Secondary | ICD-10-CM

## 2018-06-03 DIAGNOSIS — G479 Sleep disorder, unspecified: Secondary | ICD-10-CM

## 2018-06-03 DIAGNOSIS — Z0000 Encounter for general adult medical examination without abnormal findings: Secondary | ICD-10-CM | POA: Diagnosis not present

## 2018-06-03 DIAGNOSIS — Z1239 Encounter for other screening for malignant neoplasm of breast: Secondary | ICD-10-CM | POA: Diagnosis not present

## 2018-06-03 DIAGNOSIS — Z1211 Encounter for screening for malignant neoplasm of colon: Secondary | ICD-10-CM

## 2018-06-03 DIAGNOSIS — E559 Vitamin D deficiency, unspecified: Secondary | ICD-10-CM

## 2018-06-03 DIAGNOSIS — Z780 Asymptomatic menopausal state: Secondary | ICD-10-CM

## 2018-06-03 DIAGNOSIS — F331 Major depressive disorder, recurrent, moderate: Secondary | ICD-10-CM

## 2018-06-03 DIAGNOSIS — E7800 Pure hypercholesterolemia, unspecified: Secondary | ICD-10-CM

## 2018-06-03 MED ORDER — LEVOTHYROXINE SODIUM 150 MCG PO TABS: 150.0000 ug | ORAL_TABLET | Freq: Every day | ORAL | 3 refills | Status: DC

## 2018-06-03 MED ORDER — ESTRADIOL 1 MG PO TABS: 1.0000 mg | ORAL_TABLET | Freq: Every day | ORAL | 1 refills | Status: DC

## 2018-06-03 MED ORDER — TRAZODONE HCL 50 MG PO TABS: 25.0000 mg | ORAL_TABLET | Freq: Every evening | ORAL | 3 refills | Status: DC | PRN

## 2018-06-03 MED ORDER — ESCITALOPRAM OXALATE 10 MG PO TABS: 10.0000 mg | ORAL_TABLET | Freq: Every day | ORAL | 1 refills | Status: DC

## 2018-06-03 NOTE — Progress Notes (Signed)
Patient: Tammy Bailey, Female    DOB: 1955/11/28, 62 y.o.   MRN: [id number] Visit Date: 06/03/2018  Today's Provider: Mar Daring, PA-C   Chief Complaint  Patient presents with  . Annual Exam   Subjective:     [name] is a 62 y.o. female. She feels fairly well. She reports exercising includes none. She reports she is sleeping poorly. Has a granddaughter that is 74 with multiple mental health issues, including sleep walking, that keeps her awake. Also having to work swing shifts at work and this also causing her to not sleep well.  Flu vaccine done at [facility], no record; requested.  Mammogram: 2014 Colonoscopy: 2009, due Pap: s/p hysterectomy -----------------------------------------------------------   Review of Systems  Constitutional: Positive for fatigue.  HENT: Negative.   Eyes: Negative.   Respiratory: Negative.   Cardiovascular: Positive for palpitations and leg swelling.  Gastrointestinal: Negative.   Endocrine: Negative.   Genitourinary: Negative.   Musculoskeletal: Positive for arthralgias.  Skin: Negative.   Allergic/Immunologic: Negative.   Neurological: Positive for headaches.  Hematological: Negative.   Psychiatric/Behavioral: Positive for decreased concentration and sleep disturbance. The patient is nervous/anxious.     Social History   Socioeconomic History  . Marital status: Divorced    Spouse name: Not on file  . Number of children: Not on file  . Years of education: Not on file  . Highest education level: Not on file  Occupational History  . Not on file  Social Needs  . Financial resource strain: Not on file  . Food insecurity:    Worry: Not on file    Inability: Not on file  . Transportation needs:    Medical: Not on file    Non-medical: Not on file  Tobacco Use  . Smoking status: Never Smoker  . Smokeless tobacco: Never Used  Substance and Sexual Activity  . Alcohol use: No  . Drug use: No    . Sexual activity: Not on file  Lifestyle  . Physical activity:    Days per week: Not on file    Minutes per session: Not on file  . Stress: Not on file  Relationships  . Social connections:    Talks on phone: Not on file    Gets together: Not on file    Attends religious service: Not on file    Active member of club or organization: Not on file    Attends meetings of clubs or organizations: Not on file    Relationship status: Not on file  . Intimate partner violence:    Fear of current or ex partner: Not on file    Emotionally abused: Not on file    Physically abused: Not on file    Forced sexual activity: Not on file  Other Topics Concern  . Not on file  Social History Narrative  . Not on file    Past Medical History:  Diagnosis Date  . Anxiety   . Depression   . Hyperlipidemia   . Vitamin D deficiency      Patient Active Problem List   Diagnosis Date Noted  . History of esophageal stricture 04/05/2015  . Anxiety 04/02/2015  . Solitary cyst of breast 04/02/2015  . Clinical depression 04/02/2015  . Cannot sleep 04/02/2015  . Awareness of heartbeats 04/02/2015  . Post-menopausal 02/19/2015  . Vitamin D deficiency 01/03/2015  . Dermatologic disease 08/27/2009  . Allergic rhinitis 08/01/2009  . H/O total hysterectomy  07/12/2008  . Hypercholesterolemia without hypertriglyceridemia 01/11/2003  . Acid reflux 06/02/2002  . Adult hypothyroidism 04/21/2002    Past Surgical History:  Procedure Laterality Date  . ABDOMINAL HYSTERECTOMY    . APPENDECTOMY    . BLADDER SURGERY    . CARPAL TUNNEL RELEASE     Both  . CHOLECYSTECTOMY    . KNEE SURGERY Left x's two  . TONSILLECTOMY      Her family history includes Diabetes in her sister; Hypertension in her father.      Current Outpatient Medications:  .  Aspirin-Salicylamide-Caffeine (BC HEADACHE POWDER PO), Take 1 Package by mouth as needed., Disp: , Rfl:  .  escitalopram (LEXAPRO) 10 MG tablet, Take 1 tablet  (10 mg total) by mouth at bedtime., Disp: 90 tablet, Rfl: 1 .  esomeprazole (NEXIUM) 20 MG packet, Take 20 mg by mouth daily before breakfast., Disp: , Rfl:  .  estradiol (ESTRACE) 1 MG tablet, TAKE 1 TABLET BY MOUTH ONCE DAILY, Disp: 30 tablet, Rfl: 6 .  levothyroxine (SYNTHROID, LEVOTHROID) 150 MCG tablet, Take 1 tablet (150 mcg total) by mouth daily., Disp: 90 tablet, Rfl: 3 .  Vitamin D, Ergocalciferol, (DRISDOL) 50000 units CAPS capsule, TAKE 1 TABLET BY MOUTH EVERY 7 DAYS., Disp: 12 capsule, Rfl: 1  Patient Care Team: Mar Daring, PA-C as PCP - General (Family Medicine)     Objective:   Vitals: BP (!) 143/83 (BP Location: Left Arm, Patient Position: Sitting, Cuff Size: Normal)   Pulse 73   Temp 97.7 F (36.5 C) (Oral)   Wt 204 lb 9.6 oz (92.8 kg)   SpO2 95%   BMI 41.32 kg/m   Physical Exam Vitals signs reviewed.  Constitutional:      General: She is not in acute distress.    Appearance: She is well-developed. She is not diaphoretic.  HENT:     Head: Normocephalic and atraumatic.     Right Ear: Hearing, tympanic membrane, ear canal and external ear normal.     Left Ear: Hearing, tympanic membrane, ear canal and external ear normal.     Nose: Nose normal.     Mouth/Throat:     Lips: Pink.     Mouth: Mucous membranes are moist.     Dentition: Abnormal dentition.     Tongue: No lesions.     Palate: No mass.     Pharynx: Oropharynx is clear. Uvula midline. No oropharyngeal exudate.  Eyes:     General: No scleral icterus.       Right eye: No discharge.        Left eye: No discharge.     Conjunctiva/sclera: Conjunctivae normal.     Pupils: Pupils are equal, round, and reactive to light.  Neck:     Musculoskeletal: Normal range of motion and neck supple.     Thyroid: No thyromegaly.     Vascular: No carotid bruit or JVD.     Trachea: No tracheal deviation.  Cardiovascular:     Rate and Rhythm: Normal rate and regular rhythm.     Heart sounds: Normal heart  sounds. No murmur. No friction rub. No gallop.   Pulmonary:     Effort: Pulmonary effort is normal. No respiratory distress.     Breath sounds: Normal breath sounds. No wheezing or rales.  Chest:     Chest wall: No tenderness.  Abdominal:     General: Bowel sounds are normal. There is no distension.     Palpations: Abdomen is soft. There  is no mass.     Tenderness: There is no abdominal tenderness. There is no guarding or rebound.  Musculoskeletal: Normal range of motion.        General: No tenderness.  Lymphadenopathy:     Cervical: No cervical adenopathy.  Skin:    General: Skin is warm and dry.     Findings: No rash.  Neurological:     Mental Status: She is alert and oriented to person, place, and time.  Psychiatric:        Mood and Affect: Mood is depressed.        Speech: Speech normal.        Behavior: Behavior normal.        Thought Content: Thought content normal.        Judgment: Judgment normal.     Activities of Daily Living In your present state of health, do you have any difficulty performing the following activities: 06/03/2018  Hearing? N  Vision? Y  Difficulty concentrating or making decisions? N  Walking or climbing stairs? N  Dressing or bathing? N  Doing errands, shopping? N  Some recent data might be hidden    Fall Risk Assessment Fall Risk  06/03/2018 05/31/2017  Falls in the past year? 0 Yes  Number falls in past yr: 0 1  Injury with Fall? 0 Yes  Comment - "knee" Fell down steps on ice home     Depression Screen PHQ 2/9 Scores 06/03/2018 05/31/2017 11/23/2016  PHQ - 2 Score 6 6 5   PHQ- 9 Score 14 18 18     No flowsheet data found.    Assessment & Plan:    Annual Physical Reviewed patient's Family Medical History Reviewed and updated list of patient's medical providers Assessment of cognitive impairment was done Assessed patient's functional ability Established a written schedule for health screening [place]  Completed and Reviewed  Exercise Activities and Dietary recommendations Goals   None     Immunization History  Administered Date(s) Administered  . Influenza Split 08/27/2009  . Influenza,inj,Quad PF,6+ Mos 03/30/2014, 04/05/2015, 04/07/2016, 05/31/2017  . Td 04/03/2004  . Tdap 08/27/2009  . Zoster 05/19/2013    Health Maintenance  Topic Date Due  . MAMMOGRAM  04/11/2015  . COLONOSCOPY  08/14/2017  . INFLUENZA VACCINE  01/13/2018  . TETANUS/TDAP  08/28/2019  . Hepatitis C Screening  Completed  . HIV Screening  Completed  . PAP SMEAR-Modifier  Discontinued     Discussed health benefits of physical activity, and encouraged her to engage in regular exercise appropriate for her age and condition.    1. Annual physical exam Normal physical exam today. Will check labs as below and f/u pending lab results. If labs are stable and WNL she will not need to have these rechecked for one year at her next annual physical exam. She is to call the office in the meantime if she has any acute issue, questions or concerns.  2. Breast cancer screening Breast exam today was normal. There is no family history of breast cancer. She does perform regular self breast exams. Mammogram was ordered as below. Information for [facility] was given to patient so she may schedule her mammogram at her convenience. - MM 3D SCREEN BREAST BILATERAL; Future  3. Colon cancer screening Due for colonoscopy, last one 08/2007.  - Ambulatory referral to Gastroenterology  4. Adult hypothyroidism Stable. Diagnosis pulled for medication refill. Continue current medical treatment plan. Will check labs as below and f/u pending  results. - CBC w/Diff/Platelet - TSH - levothyroxine (SYNTHROID, LEVOTHROID) 150 MCG tablet; Take 1 tablet (150 mcg total) by mouth daily.  Dispense: 90 tablet; Refill: 3  5. Moderate episode of recurrent major depressive disorder (HCC) Stable. Diagnosis pulled for medication refill.  Continue current medical treatment plan.  - escitalopram (LEXAPRO) 10 MG tablet; Take 1 tablet (10 mg total) by mouth at bedtime.  Dispense: 90 tablet; Refill: 1  6. Hypercholesterolemia without hypertriglyceridemia Diet controlled. Will check labs as below and f/u pending results. - CBC w/Diff/Platelet - Comprehensive Metabolic Panel (CMET) - Lipid Profile - HgB A1c  7. Vitamin D deficiency H/O this and post menopausal. Will check labs as below and f/u pending results. - CBC w/Diff/Platelet - Vitamin D (25 hydroxy)  8. Class 3 severe obesity due to excess calories with serious comorbidity and body mass index (BMI) of 40.0 to 44.9 in adult [facility]) Counseled patient on healthy lifestyle modifications including dieting and exercise.  - CBC w/Diff/Platelet - Comprehensive Metabolic Panel (CMET) - Lipid Profile - HgB A1c  9. Difficulty sleeping Secondary to anxiety, stress and working swing shifts. Will try trazodone as below. I will see her back in the coming weeks and will see if trazodone helping.  - traZODone (DESYREL) 50 MG tablet; Take 0.5-1 tablets (25-50 mg total) by mouth at bedtime as needed for sleep.  Dispense: 30 tablet; Refill: 3 - escitalopram (LEXAPRO) 10 MG tablet; Take 1 tablet (10 mg total) by mouth at bedtime.  Dispense: 90 tablet; Refill: 1  10. Post-menopausal Stable. Diagnosis pulled for medication refill. Continue current medical treatment plan. - estradiol (ESTRACE) 1 MG tablet; Take 1 tablet (1 mg total) by mouth daily.  Dispense: 90 tablet; Refill: 1  ------------------------------------------------------------------------------------------------------------    Mar Daring, PA-C  [facility]

## 2018-06-03 NOTE — Patient Instructions (Signed)

## 2018-06-04 LAB — VITAMIN D 25 HYDROXY (VIT D DEFICIENCY, FRACTURES): Vit D, 25-Hydroxy: 36.3 ng/mL (ref 30.0–100.0)

## 2018-06-04 LAB — CBC WITH DIFFERENTIAL/PLATELET
Basophils Absolute: 0.1 10*3/uL (ref 0.0–0.2)
Basos: 1 %
EOS (ABSOLUTE): 0.2 10*3/uL (ref 0.0–0.4)
Eos: 3 %
Hematocrit: 39.6 % (ref 34.0–46.6)
Hemoglobin: 13.5 g/dL (ref 11.1–15.9)
Immature Grans (Abs): 0 10*3/uL (ref 0.0–0.1)
Immature Granulocytes: 0 %
Lymphocytes Absolute: 1.9 10*3/uL (ref 0.7–3.1)
Lymphs: 31 %
MCH: 29.9 pg (ref 26.6–33.0)
MCHC: 34.1 g/dL (ref 31.5–35.7)
MCV: 88 fL (ref 79–97)
Monocytes Absolute: 0.6 10*3/uL (ref 0.1–0.9)
Monocytes: 10 %
Neutrophils Absolute: 3.4 10*3/uL (ref 1.4–7.0)
Neutrophils: 55 %
Platelets: 352 10*3/uL (ref 150–450)
RBC: 4.51 x10E6/uL (ref 3.77–5.28)
RDW: 12.4 % (ref 12.3–15.4)
WBC: 6.2 10*3/uL (ref 3.4–10.8)

## 2018-06-04 LAB — COMPREHENSIVE METABOLIC PANEL
ALT: 13 IU/L (ref 0–32)
AST: 16 IU/L (ref 0–40)
Albumin/Globulin Ratio: 1.6 (ref 1.2–2.2)
Albumin: 4.2 g/dL (ref 3.6–4.8)
Alkaline Phosphatase: 96 IU/L (ref 39–117)
BUN/Creatinine Ratio: 20 (ref 12–28)
BUN: 15 mg/dL (ref 8–27)
Bilirubin Total: 0.3 mg/dL (ref 0.0–1.2)
CO2: 20 mmol/L (ref 20–29)
Calcium: 9.2 mg/dL (ref 8.7–10.3)
Chloride: 102 mmol/L (ref 96–106)
Creatinine, Ser: 0.74 mg/dL (ref 0.57–1.00)
GFR calc Af Amer: 100 mL/min/{1.73_m2} (ref >59)
GFR calc non Af Amer: 86 mL/min/{1.73_m2} (ref >59)
Globulin, Total: 2.7 g/dL (ref 1.5–4.5)
Glucose: 83 mg/dL (ref 65–99)
Potassium: 4.2 mmol/L (ref 3.5–5.2)
Sodium: 139 mmol/L (ref 134–144)
Total Protein: 6.9 g/dL (ref 6.0–8.5)

## 2018-06-04 LAB — LIPID PANEL
Chol/HDL Ratio: 2.7 ratio (ref 0.0–4.4)
Cholesterol, Total: 195 mg/dL (ref 100–199)
HDL: 71 mg/dL (ref >39)
LDL Calculated: 108 mg/dL — ABNORMAL HIGH (ref 0–99)
Triglycerides: 81 mg/dL (ref 0–149)
VLDL Cholesterol Cal: 16 mg/dL (ref 5–40)

## 2018-06-04 LAB — HEMOGLOBIN A1C
Est. average glucose Bld gHb Est-mCnc: 108 mg/dL
Hgb A1c MFr Bld: 5.4 % (ref 4.8–5.6)

## 2018-06-04 LAB — TSH: TSH: 2.15 u[IU]/mL (ref 0.450–4.500)

## 2018-06-06 ENCOUNTER — Telehealth: Payer: Self-pay

## 2018-06-06 NOTE — Telephone Encounter (Signed)
Patient advised as directed below. 

## 2018-06-06 NOTE — Telephone Encounter (Signed)
-----   Message from Mar Daring, [place] sent at 06/06/2018  8:36 AM EST ----- Blood count is normal. Kidney and liver function are normal. Sugar normal. Thyroid normal. Cholesterol much improved from last year. Vit D normal.

## 2018-06-20 ENCOUNTER — Ambulatory Visit: Payer: BLUE CROSS/BLUE SHIELD | Admitting: Physician Assistant

## 2018-06-20 ENCOUNTER — Encounter: Payer: Self-pay | Admitting: Physician Assistant

## 2018-06-20 VITALS — BP 119/83 | HR 75 | Temp 98.4°F | Resp 16 | Ht 59.0 in | Wt 208.0 lb

## 2018-06-20 DIAGNOSIS — M222X2 Patellofemoral disorders, left knee: Secondary | ICD-10-CM

## 2018-06-20 DIAGNOSIS — M222X1 Patellofemoral disorders, right knee: Secondary | ICD-10-CM | POA: Diagnosis not present

## 2018-06-20 DIAGNOSIS — M25562 Pain in left knee: Secondary | ICD-10-CM | POA: Diagnosis not present

## 2018-06-20 DIAGNOSIS — M25561 Pain in right knee: Secondary | ICD-10-CM | POA: Diagnosis not present

## 2018-06-20 DIAGNOSIS — G8929 Other chronic pain: Secondary | ICD-10-CM

## 2018-06-20 MED ORDER — DICLOFENAC SODIUM 1 % TD GEL: 2.0000 g | Freq: Four times a day (QID) | TRANSDERMAL | 3 refills | Status: DC

## 2018-06-20 NOTE — Progress Notes (Signed)
Patient: Tammy Bailey Female    DOB: May 18, 1956   63 y.o.   MRN: [id number] Visit Date: 06/20/2018  Today's Provider: Mar Daring, PA-C   Chief Complaint  Patient presents with  . Knee Pain   Subjective:     Knee Pain   The incident occurred more than 1 week ago. The injury mechanism is unknown. The pain is present in the left knee and right knee. The quality of the pain is described as burning. The pain is at a severity of 8/10. The pain is severe. The pain has been constant since onset. She reports no foreign bodies present. The symptoms are aggravated by movement. She has tried NSAIDs for the symptoms. The treatment provided mild relief.   Patient has had knee pain for years. Patient had surgery on on right knee about 40 years ago. Patient states pain has worsened lately. Patient states it is painful to bend or twist her knees. Patient takes [facility] powders for pain with mild relief. She reports pain is worse with stairs. No swelling.    Allergies  Allergen Reactions  . Erythromycin      Current Outpatient Medications:  .  Aspirin-Salicylamide-Caffeine (BC HEADACHE POWDER PO), Take 1 Package by mouth as needed., Disp: , Rfl:  .  escitalopram (LEXAPRO) 10 MG tablet, Take 1 tablet (10 mg total) by mouth at bedtime., Disp: 90 tablet, Rfl: 1 .  esomeprazole (NEXIUM) 20 MG packet, Take 20 mg by mouth daily before breakfast., Disp: , Rfl:  .  estradiol (ESTRACE) 1 MG tablet, Take 1 tablet (1 mg total) by mouth daily., Disp: 90 tablet, Rfl: 1 .  levothyroxine (SYNTHROID, LEVOTHROID) 150 MCG tablet, Take 1 tablet (150 mcg total) by mouth daily., Disp: 90 tablet, Rfl: 3 .  traZODone (DESYREL) 50 MG tablet, Take 0.5-1 tablets (25-50 mg total) by mouth at bedtime as needed for sleep., Disp: 30 tablet, Rfl: 3 .  Vitamin D, Ergocalciferol, (DRISDOL) 50000 units CAPS capsule, TAKE 1 TABLET BY MOUTH EVERY 7 DAYS., Disp: 12 capsule, Rfl: 1  Review of Systems  Constitutional:  Negative for appetite change, chills, fatigue and fever.  Respiratory: Negative for chest tightness and shortness of breath.   Cardiovascular: Negative for chest pain and palpitations.  Gastrointestinal: Negative for abdominal pain, nausea and vomiting.  Musculoskeletal: Positive for arthralgias.  Neurological: Negative for dizziness and weakness.    Social History   Tobacco Use  . Smoking status: Never Smoker  . Smokeless tobacco: Never Used  Substance Use Topics  . Alcohol use: No      Objective:   BP 119/83 (BP Location: Left Arm, Patient Position: Sitting, Cuff Size: Large)   Pulse 75   Temp 98.4 F (36.9 C) (Oral)   Resp 16   Ht 4\' 11"  (1.499 m)   Wt 208 lb (94.3 kg)   SpO2 98%   BMI 42.01 kg/m  Vitals:   06/20/18 1556  BP: 119/83  Pulse: 75  Resp: 16  Temp: 98.4 F (36.9 C)  TempSrc: Oral  SpO2: 98%  Weight: 208 lb (94.3 kg)  Height: 4\' 11"  (1.499 m)     Physical Exam Vitals signs reviewed.  Constitutional:      General: She is not in acute distress.    Appearance: She is well-developed. She is not diaphoretic.  Neck:     Musculoskeletal: Normal range of motion and neck supple.  Cardiovascular:     Rate and Rhythm: Normal rate and  regular rhythm.     Heart sounds: Normal heart sounds. No murmur. No friction rub. No gallop.   Pulmonary:     Effort: Pulmonary effort is normal. No respiratory distress.     Breath sounds: Normal breath sounds. No wheezing or rales.  Musculoskeletal:     Right knee: She exhibits normal range of motion, no swelling, no erythema, no LCL laxity, normal meniscus and no MCL laxity. Tenderness (patella and femoral condyles) found. Patellar tendon tenderness noted.     Left knee: She exhibits normal range of motion, no swelling, no effusion, no erythema, normal alignment, no LCL laxity, normal meniscus and no MCL laxity. Tenderness (patella and lateral femoral condyle) found. Patellar tendon tenderness noted.        Assessment  & Plan    1. Chronic pain of both knees Suspect chondromalacia patella/patellofemoral syndrome. Will get imaging as below. Will try diclofenac gel as below for inflammation and pain. Discussed steroid injection/taper and PT but patient declines these at this time. I will f/u pending xrays and will decide if ortho referral is necessary at this time.  - DG Knee 4 Views W/Patella Left; Future - DG Knee 4 Views W/Patella Right; Future - diclofenac sodium (VOLTAREN) 1 % GEL; Apply 2 g topically 4 (four) times daily.  Dispense: 400 g; Refill: 3  2. Patellofemoral arthralgia of both knees See above medical treatment plan. - DG Knee 4 Views W/Patella Right; Future - diclofenac sodium (VOLTAREN) 1 % GEL; Apply 2 g topically 4 (four) times daily.  Dispense: 400 g; Refill: [place], PA-C  [facility]

## 2018-06-21 ENCOUNTER — Ambulatory Visit
Admission: RE | Admit: 2018-06-21 | Discharge: 2018-06-21 | Disposition: A | Discharge status: Home / Self Care | Payer: BLUE CROSS/BLUE SHIELD | Attending: Physician Assistant | Admitting: Physician Assistant

## 2018-06-21 ENCOUNTER — Ambulatory Visit
Admission: RE | Admit: 2018-06-21 | Discharge: 2018-06-21 | Disposition: A | Discharge status: Home / Self Care | Payer: BLUE CROSS/BLUE SHIELD | Source: Ambulatory Visit | Attending: Physician Assistant | Admitting: Physician Assistant

## 2018-06-21 DIAGNOSIS — G8929 Other chronic pain: Secondary | ICD-10-CM

## 2018-06-21 DIAGNOSIS — M222X2 Patellofemoral disorders, left knee: Secondary | ICD-10-CM | POA: Diagnosis present

## 2018-06-21 DIAGNOSIS — M25561 Pain in right knee: Secondary | ICD-10-CM | POA: Insufficient documentation

## 2018-06-21 DIAGNOSIS — M25562 Pain in left knee: Principal | ICD-10-CM

## 2018-06-21 DIAGNOSIS — M222X1 Patellofemoral disorders, right knee: Secondary | ICD-10-CM | POA: Diagnosis present

## 2018-06-22 ENCOUNTER — Other Ambulatory Visit: Payer: Self-pay

## 2018-06-22 DIAGNOSIS — Z1211 Encounter for screening for malignant neoplasm of colon: Secondary | ICD-10-CM

## 2018-06-23 ENCOUNTER — Telehealth: Payer: Self-pay

## 2018-06-23 DIAGNOSIS — M170 Bilateral primary osteoarthritis of knee: Secondary | ICD-10-CM

## 2018-06-23 DIAGNOSIS — M222X1 Patellofemoral disorders, right knee: Secondary | ICD-10-CM

## 2018-06-23 DIAGNOSIS — M222X2 Patellofemoral disorders, left knee: Secondary | ICD-10-CM

## 2018-06-23 NOTE — Telephone Encounter (Signed)
Patient advised as directed below. Per patient is there anything that can be done for the bone spurs? Patient was advised that Tammy Bailey is out the office until Monday. Patient is ok on waiting until she comes back.

## 2018-06-23 NOTE — Telephone Encounter (Signed)
-----   Message from Mar Daring, [place] sent at 06/22/2018  7:30 AM EST ----- Right knee does show bone spurs on the patellofemoral joint, which is consistent with the patellofemoral syndrome I discussed in the office. The left knee, however, does show moderate OA throughout the knee joint.

## 2018-06-23 NOTE — Telephone Encounter (Signed)
LMTCB

## 2018-07-01 NOTE — Telephone Encounter (Signed)
LMOVM for pt to return call 

## 2018-07-01 NOTE — Telephone Encounter (Signed)
It would take a arthroscopic surgery by orthopedics. They have to be surgically removed.

## 2018-07-04 ENCOUNTER — Telehealth: Payer: Self-pay | Admitting: Physician Assistant

## 2018-07-04 NOTE — Telephone Encounter (Signed)
Pt advised.  She would like a referral to Dr. Marry Guan.  Thanks,   -Mickel Baas

## 2018-07-04 NOTE — Telephone Encounter (Signed)
Pt advised.  See previous phone message.   Thanks,   -Mickel Baas

## 2018-07-04 NOTE — Telephone Encounter (Signed)
Pt returned missed call from [place]. Please call pt back.  Thanks, [employer]

## 2018-07-04 NOTE — Telephone Encounter (Signed)
Referral placed.

## 2018-07-05 ENCOUNTER — Telehealth: Payer: Self-pay | Admitting: Gastroenterology

## 2018-07-05 NOTE — Telephone Encounter (Signed)
LVM to let patient know her colonoscopy has been canceled.  In regards to her request for cologuard testing-we do not do cologuard testing in our office.  She has been asked to contact her  PCP to arrange cologuard.  Thanks [employer]

## 2018-07-05 NOTE — Telephone Encounter (Signed)
Patient called & would like her colonoscopy cancelled for 07-11-2018 scheduled with Dr Vicente Males. She is having knee sx. She is interested in doing the ColorGaurd  Instead of a colonoscopy since the last one was fine.

## 2018-07-11 ENCOUNTER — Encounter: Admission: RE | Payer: Self-pay | Source: Home / Self Care

## 2018-07-11 ENCOUNTER — Ambulatory Visit
Admission: RE | Admit: 2018-07-11 | Payer: BLUE CROSS/BLUE SHIELD | Source: Home / Self Care | Admitting: Gastroenterology

## 2018-07-11 SURGERY — COLONOSCOPY WITH PROPOFOL
Anesthesia: General

## 2018-07-19 ENCOUNTER — Telehealth: Payer: Self-pay | Admitting: Physician Assistant

## 2018-07-19 DIAGNOSIS — Z1211 Encounter for screening for malignant neoplasm of colon: Secondary | ICD-10-CM

## 2018-07-19 NOTE — Telephone Encounter (Signed)
Pt states she would like to use the Cologuard instead of having a colonoscopy.    Pt requesting call back with instructions.

## 2018-07-20 NOTE — Telephone Encounter (Signed)
Patient denied any problem with any of the questions you asked.

## 2018-07-20 NOTE — Telephone Encounter (Signed)
Are you ok with her having this?

## 2018-07-20 NOTE — Telephone Encounter (Signed)
Cologuard ordered

## 2018-07-20 NOTE — Telephone Encounter (Signed)
Has she ever had polyps, bleeding hemorrhoids or family history of colon cancer?

## 2018-08-07 LAB — COLOGUARD: Cologuard: NEGATIVE

## 2018-08-10 ENCOUNTER — Telehealth: Payer: Self-pay

## 2018-08-10 NOTE — Telephone Encounter (Signed)
-----   Message from Mar Daring, PA-C sent at 08/10/2018  1:33 PM EST ----- Cologuard negative. Repeat in 3 years

## 2018-08-10 NOTE — Telephone Encounter (Signed)
Patient advised as directed below. 

## 2018-08-29 DIAGNOSIS — Z6841 Body Mass Index (BMI) 40.0 and over, adult: Secondary | ICD-10-CM | POA: Insufficient documentation

## 2018-09-27 ENCOUNTER — Other Ambulatory Visit: Payer: Self-pay

## 2018-09-27 ENCOUNTER — Encounter: Payer: Self-pay | Admitting: Podiatry

## 2018-09-27 ENCOUNTER — Ambulatory Visit: Payer: BLUE CROSS/BLUE SHIELD | Admitting: Podiatry

## 2018-09-27 VITALS — Temp 99.1°F

## 2018-09-27 DIAGNOSIS — M7662 Achilles tendinitis, left leg: Secondary | ICD-10-CM

## 2018-09-27 MED ORDER — MELOXICAM 15 MG PO TABS: 15.0000 mg | ORAL_TABLET | Freq: Every day | ORAL | 1 refills | Status: DC

## 2018-09-27 MED ORDER — METHYLPREDNISOLONE 4 MG PO TBPK: ORAL_TABLET | ORAL | 0 refills | Status: DC

## 2018-09-27 NOTE — Progress Notes (Signed)
   HPI: 63 year old female presenting today for follow up evaluation of achilles tendinitis of the left lower extremity.  Patient was last seen on 02/15/2018 and she was significantly better.  Patient states that out of the blue over the past 2 months the pain has recurred to her Achilles tendon.  Patient denies any change in activity.  She presents for follow-up treatment and evaluation   Past Medical History:  Diagnosis Date  . Anxiety   . Depression   . Hyperlipidemia   . Vitamin D deficiency       Physical Exam: General: The patient is alert and oriented x3 in no acute distress.  Dermatology: Skin is warm, dry and supple bilateral lower extremities. Negative for open lesions or macerations.  Vascular: Palpable pedal pulses bilaterally. No edema or erythema noted. Capillary refill within normal limits.  Neurological: Epicritic and protective threshold grossly intact bilaterally.   Musculoskeletal Exam: Pain on palpation noted to the posterior tubercle of the left calcaneus at the insertion of the Achilles tendon consistent with retrocalcaneal bursitis. Range of motion within normal limits. Muscle strength 5/5 in all muscle groups bilateral lower extremities.  Assessment: 1. Achilles tendinitis left -recurrent   Plan of Care:  1. Patient was evaluated.  2.  Patient declined injection today.  Conservative treatments did help in the past and she would like to pursue those again today. 3.  Prescription for Medrol Dosepak 4.  Prescription for meloxicam 15 mg 5.  Silicone heel sleeve dispensed 6. Stressed importance of stretching to alleviate symptoms.  7.  Return to clinic in 4 weeks  Goes by Tammy Bailey.  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 Edrick Kins, [address]                                        [place], [place] [address]                Office [phone]  Fax [phone]

## 2018-10-10 ENCOUNTER — Ambulatory Visit (INDEPENDENT_AMBULATORY_CARE_PROVIDER_SITE_OTHER): Payer: BLUE CROSS/BLUE SHIELD | Admitting: Physician Assistant

## 2018-10-10 ENCOUNTER — Encounter: Payer: Self-pay | Admitting: Physician Assistant

## 2018-10-10 ENCOUNTER — Other Ambulatory Visit: Payer: Self-pay

## 2018-10-10 VITALS — BP 120/72 | HR 80 | Temp 98.4°F | Wt 209.6 lb

## 2018-10-10 DIAGNOSIS — R601 Generalized edema: Secondary | ICD-10-CM

## 2018-10-10 MED ORDER — POTASSIUM CHLORIDE CRYS ER 20 MEQ PO TBCR: 20.0000 meq | EXTENDED_RELEASE_TABLET | Freq: Every day | ORAL | 3 refills | Status: DC

## 2018-10-10 MED ORDER — FUROSEMIDE 20 MG PO TABS: 20.0000 mg | ORAL_TABLET | Freq: Every day | ORAL | 3 refills | Status: DC

## 2018-10-10 NOTE — Patient Instructions (Signed)
Chronic Venous Insufficiency Chronic venous insufficiency, also called venous stasis, is a condition that prevents blood from being pumped effectively through the veins in your legs. Blood may no longer be pumped effectively from the legs back to the heart. This condition can range from mild to severe. With proper treatment, you should be able to continue with an active life. What are the causes? Chronic venous insufficiency occurs when the vein walls become stretched, weakened, or damaged, or when valves within the vein are damaged. Some common causes of this include:  High blood pressure inside the veins (venous hypertension).  Increased blood pressure in the leg veins from long periods of sitting or standing.  A blood clot that blocks blood flow in a vein (deep vein thrombosis, DVT).  Inflammation of a vein (phlebitis) that causes a blood clot to form.  Tumors in the pelvis that cause blood to back up. What increases the risk? The following factors may make you more likely to develop this condition:  Having a family history of this condition.  Obesity.  Pregnancy.  Living without enough physical activity or exercise (sedentary lifestyle).  Smoking.  Having a job that requires long periods of standing or sitting in one place.  Being a certain age. Women in their 40s and 50s and men in their 70s are more likely to develop this condition. What are the signs or symptoms? Symptoms of this condition include:  Veins that are enlarged, bulging, or twisted (varicose veins).  Skin breakdown or ulcers.  Reddened or discolored skin on the front of the leg.  Brown, smooth, tight, and painful skin just above the ankle, usually on the inside of the leg (lipodermatosclerosis).  Swelling. How is this diagnosed? This condition may be diagnosed based on:  Your medical history.  A physical exam.  Tests, such as: ? A procedure that creates an image of a blood vessel and nearby organs  and provides information about blood flow through the blood vessel (duplex ultrasound). ? A procedure that tests blood flow (plethysmography). ? A procedure to look at the veins using X-ray and dye (venogram). How is this treated? The goals of treatment are to help you return to an active life and to minimize pain or disability. Treatment depends on the severity of your condition, and it may include:  Wearing compression stockings. These can help relieve symptoms and help prevent your condition from getting worse. However, they do not cure the condition.  Sclerotherapy. This is a procedure involving an injection of a material that "dissolves" damaged veins.  Surgery. This may involve: ? Removing a diseased vein (vein stripping). ? Cutting off blood flow through the vein (laser ablation surgery). ? Repairing a valve. Follow these instructions at home:      Wear compression stockings as told by your health care provider. These stockings help to prevent blood clots and reduce swelling in your legs.  Take over-the-counter and prescription medicines only as told by your health care provider.  Stay active by exercising, walking, or doing different activities. Ask your health care provider what activities are safe for you and how much exercise you need.  Drink enough fluid to keep your urine clear or pale yellow.  Do not use any products that contain nicotine or tobacco, such as cigarettes and e-cigarettes. If you need help quitting, ask your health care provider.  Keep all follow-up visits as told by your health care provider. This is important. Contact a health care provider if:  You have   redness, swelling, or more pain in the affected area.  You see a red streak or line that extends up or down from the affected area.  You have skin breakdown or a loss of skin in the affected area, even if the breakdown is small.  You get an injury in the affected area. Get help right away if:   You get an injury and an open wound in the affected area.  You have severe pain that does not get better with medicine.  You have sudden numbness or weakness in the foot or ankle below the affected area, or you have trouble moving your foot or ankle.  You have a fever and you have worse or persistent symptoms.  You have chest pain.  You have shortness of breath. Summary  Chronic venous insufficiency, also called venous stasis, is a condition that prevents blood from being pumped effectively through the veins in your legs.  Chronic venous insufficiency occurs when the vein walls become stretched, weakened, or damaged, or when valves within the vein are damaged.  Treatment for this condition depends on how severe your condition is, and it may involve wearing compression stockings or having a procedure.  Make sure you stay active by exercising, walking, or doing different activities. Ask your health care provider what activities are safe for you and how much exercise you need. This information is not intended to replace advice given to you by your health care provider. Make sure you discuss any questions you have with your health care provider. Document Released: 10/05/2006 Document Revised: 04/20/2016 Document Reviewed: 04/20/2016 Elsevier Interactive Patient Education  2019 [place] for [employer] Calories are units of energy. Your body needs a certain amount of calories from food to keep you going throughout the day. When you eat more calories than your body needs, your body stores the extra calories as fat. When you eat fewer calories than your body needs, your body burns fat to get the energy it needs. Calorie counting means keeping track of how many calories you eat and drink each day. Calorie counting can be helpful if you need to lose weight. If you make sure to eat fewer calories than your body needs, you should lose weight. Ask your health care provider  what a healthy weight is for you. For calorie counting to work, you will need to eat the right number of calories in a day in order to lose a healthy amount of weight per week. A dietitian can help you determine how many calories you need in a day and will give you suggestions on how to reach your calorie goal.  A healthy amount of weight to lose per week is usually 1-2 lb (0.5-0.9 kg). This usually means that your daily calorie intake should be reduced by 500-750 calories.  Eating 1,200 - 1,500 calories per day can help most women lose weight.  Eating 1,500 - 1,800 calories per day can help most men lose weight. What is my plan? My goal is to have 1200-1300 calories per day. If I have this many calories per day, I should lose around 1-2 pounds per week. What do I need to know about calorie counting? In order to meet your daily calorie goal, you will need to:  Find out how many calories are in each food you would like to eat. Try to do this before you eat.  Decide how much of the food you plan to eat.  Write down what  you ate and how many calories it had. Doing this is called keeping a food log. To successfully lose weight, it is important to balance calorie counting with a healthy lifestyle that includes regular activity. Aim for 150 minutes of moderate exercise (such as walking) or 75 minutes of vigorous exercise (such as running) each week. Where do I find calorie information?  The number of calories in a food can be found on a Nutrition Facts label. If a food does not have a Nutrition Facts label, try to look up the calories online or ask your dietitian for help. Remember that calories are listed per serving. If you choose to have more than one serving of a food, you will have to multiply the calories per serving by the amount of servings you plan to eat. For example, the label on a package of bread might say that a serving size is 1 slice and that there are 90 calories in a serving. If  you eat 1 slice, you will have eaten 90 calories. If you eat 2 slices, you will have eaten 180 calories. How do I keep a food log? Immediately after each meal, record the following information in your food log:  What you ate. Don't forget to include toppings, sauces, and other extras on the food.  How much you ate. This can be measured in cups, ounces, or number of items.  How many calories each food and drink had.  The total number of calories in the meal. Keep your food log near you, such as in a small notebook in your pocket, or use a mobile app or website. Some programs will calculate calories for you and show you how many calories you have left for the day to meet your goal. What are some calorie counting tips?   Use your calories on foods and drinks that will fill you up and not leave you hungry: ? Some examples of foods that fill you up are nuts and nut butters, vegetables, lean proteins, and high-fiber foods like whole grains. High-fiber foods are foods with more than 5 g fiber per serving. ? Drinks such as sodas, specialty coffee drinks, alcohol, and juices have a lot of calories, yet do not fill you up.  Eat nutritious foods and avoid empty calories. Empty calories are calories you get from foods or beverages that do not have many vitamins or protein, such as candy, sweets, and soda. It is better to have a nutritious high-calorie food (such as an avocado) than a food with few nutrients (such as a bag of chips).  Know how many calories are in the foods you eat most often. This will help you calculate calorie counts faster.  Pay attention to calories in drinks. Low-calorie drinks include water and unsweetened drinks.  Pay attention to nutrition labels for "low fat" or "fat free" foods. These foods sometimes have the same amount of calories or more calories than the full fat versions. They also often have added sugar, starch, or salt, to make up for flavor that was removed with the  fat.  Find a way of tracking calories that works for you. Get creative. Try different apps or programs if writing down calories does not work for you. What are some portion control tips?  Know how many calories are in a serving. This will help you know how many servings of a certain food you can have.  Use a measuring cup to measure serving sizes. You could also try weighing out portions  on a kitchen scale. With time, you will be able to estimate serving sizes for some foods.  Take some time to put servings of different foods on your favorite plates, bowls, and cups so you know what a serving looks like.  Try not to eat straight from a bag or box. Doing this can lead to overeating. Put the amount you would like to eat in a cup or on a plate to make sure you are eating the right portion.  Use smaller plates, glasses, and bowls to prevent overeating.  Try not to multitask (for example, watch TV or use your computer) while eating. If it is time to eat, sit down at a table and enjoy your food. This will help you to know when you are full. It will also help you to be aware of what you are eating and how much you are eating. What are tips for following this plan? Reading food labels  Check the calorie count compared to the serving size. The serving size may be smaller than what you are used to eating.  Check the source of the calories. Make sure the food you are eating is high in vitamins and protein and low in saturated and trans fats. Shopping  Read nutrition labels while you shop. This will help you make healthy decisions before you decide to purchase your food.  Make a grocery list and stick to it. Cooking  Try to cook your favorite foods in a healthier way. For example, try baking instead of frying.  Use low-fat dairy products. Meal planning  Use more fruits and vegetables. Half of your plate should be fruits and vegetables.  Include lean proteins like poultry and fish. How do I  count calories when eating out?  Ask for smaller portion sizes.  Consider sharing an entree and sides instead of getting your own entree.  If you get your own entree, eat only half. Ask for a box at the beginning of your meal and put the rest of your entree in it so you are not tempted to eat it.  If calories are listed on the menu, choose the lower calorie options.  Choose dishes that include vegetables, fruits, whole grains, low-fat dairy products, and lean protein.  Choose items that are boiled, broiled, grilled, or steamed. Stay away from items that are buttered, battered, fried, or served with cream sauce. Items labeled "crispy" are usually fried, unless stated otherwise.  Choose water, low-fat milk, unsweetened iced tea, or other drinks without added sugar. If you want an alcoholic beverage, choose a lower calorie option such as a glass of wine or light beer.  Ask for dressings, sauces, and syrups on the side. These are usually high in calories, so you should limit the amount you eat.  If you want a salad, choose a garden salad and ask for grilled meats. Avoid extra toppings like bacon, cheese, or fried items. Ask for the dressing on the side, or ask for olive oil and vinegar or lemon to use as dressing.  Estimate how many servings of a food you are given. For example, a serving of cooked rice is  cup or about the size of half a baseball. Knowing serving sizes will help you be aware of how much food you are eating at restaurants. The list below tells you how big or small some common portion sizes are based on everyday objects: ? 1 oz-4 stacked dice. ? 3 oz-1 deck of cards. ? 1 tsp-1 die. ?  1 Tbsp- a ping-pong ball. ? 2 Tbsp-1 ping-pong ball. ?  cup- baseball. ? 1 cup-1 baseball. Summary  Calorie counting means keeping track of how many calories you eat and drink each day. If you eat fewer calories than your body needs, you should lose weight.  A healthy amount of weight to  lose per week is usually 1-2 lb (0.5-0.9 kg). This usually means reducing your daily calorie intake by 500-750 calories.  The number of calories in a food can be found on a Nutrition Facts label. If a food does not have a Nutrition Facts label, try to look up the calories online or ask your dietitian for help.  Use your calories on foods and drinks that will fill you up, and not on foods and drinks that will leave you hungry.  Use smaller plates, glasses, and bowls to prevent overeating. This information is not intended to replace advice given to you by your health care provider. Make sure you discuss any questions you have with your health care provider. Document Released: 06/01/2005 Document Revised: 02/18/2018 Document Reviewed: 05/01/2016 Elsevier Interactive Patient Education  2019 [employer].

## 2018-10-10 NOTE — Progress Notes (Signed)
Patient: Tammy Bailey Female    DOB: [birth year]   63 y.o.   MRN: [id number] Visit Date: 10/10/2018  Today's Provider: Mar Daring, PA-C   Chief Complaint  Patient presents with  . Leg Swelling   Subjective:     HPI  Leg Swelling Patient presents today for leg swelling for a few months now. Patient states that she sometimes can not remove ring from finger due to swelling in hands. Patient thinks she is retaining fluids. Also reports that since Covid 19 it has been slow at her work and she has not been moving around like she normally does.    Allergies  Allergen Reactions  . Erythromycin      Current Outpatient Medications:  .  Aspirin-Salicylamide-Caffeine (BC HEADACHE POWDER PO), Take 1 Package by mouth as needed., Disp: , Rfl:  .  diclofenac sodium (VOLTAREN) 1 % GEL, Apply 2 g topically 4 (four) times daily., Disp: 400 g, Rfl: 3 .  escitalopram (LEXAPRO) 10 MG tablet, Take 1 tablet (10 mg total) by mouth at bedtime., Disp: 90 tablet, Rfl: 1 .  esomeprazole (NEXIUM) 20 MG packet, Take 20 mg by mouth daily before breakfast., Disp: , Rfl:  .  estradiol (ESTRACE) 1 MG tablet, Take 1 tablet (1 mg total) by mouth daily., Disp: 90 tablet, Rfl: 1 .  levothyroxine (SYNTHROID, LEVOTHROID) 150 MCG tablet, Take 1 tablet (150 mcg total) by mouth daily., Disp: 90 tablet, Rfl: 3 .  meloxicam (MOBIC) 15 MG tablet, Take 1 tablet (15 mg total) by mouth daily., Disp: 30 tablet, Rfl: 1 .  methylPREDNISolone (MEDROL DOSEPAK) 4 MG TBPK tablet, 6 day dose pack - take as directed, Disp: 21 tablet, Rfl: 0 .  traZODone (DESYREL) 50 MG tablet, Take 0.5-1 tablets (25-50 mg total) by mouth at bedtime as needed for sleep., Disp: 30 tablet, Rfl: 3 .  Vitamin D, Ergocalciferol, (DRISDOL) 50000 units CAPS capsule, TAKE 1 TABLET BY MOUTH EVERY 7 DAYS., Disp: 12 capsule, Rfl: 1  Review of Systems  Constitutional: Negative.   HENT: Negative.   Respiratory: Negative.   Cardiovascular:  Positive for leg swelling.  Gastrointestinal: Negative.   Musculoskeletal: Negative.   Neurological: Negative.   Psychiatric/Behavioral: Negative.     Social History   Tobacco Use  . Smoking status: Never Smoker  . Smokeless tobacco: Never Used  Substance Use Topics  . Alcohol use: No      Objective:   BP (!) 145/85 (BP Location: Left Arm, Patient Position: Sitting, Cuff Size: Normal)   Pulse 80   Temp 98.4 F (36.9 C) (Oral)   Wt 209 lb 9.6 oz (95.1 kg)   SpO2 96%   BMI 42.33 kg/m  Vitals:   10/10/18 1515  BP: (!) 145/85  Pulse: 80  Temp: 98.4 F (36.9 C)  TempSrc: Oral  SpO2: 96%  Weight: 209 lb 9.6 oz (95.1 kg)     Physical Exam Vitals signs reviewed.  Constitutional:      General: She is not in acute distress.    Appearance: Normal appearance. She is well-developed. She is obese. She is not ill-appearing or diaphoretic.  Neck:     Musculoskeletal: Normal range of motion and neck supple.     Thyroid: No thyromegaly.     Vascular: No JVD.     Trachea: No tracheal deviation.  Cardiovascular:     Rate and Rhythm: Normal rate and regular rhythm.     Pulses: Normal pulses.  Heart sounds: Normal heart sounds. No murmur. No friction rub. No gallop.   Pulmonary:     Effort: Pulmonary effort is normal. No respiratory distress.     Breath sounds: Normal breath sounds. No wheezing or rales.  Musculoskeletal:     Right lower leg: Edema (1+) present.     Left lower leg: Edema (1+) present.  Lymphadenopathy:     Cervical: No cervical adenopathy.  Neurological:     Mental Status: She is alert.        Assessment & Plan    1. Generalized edema Worsening suspected due to inactivity and salt consumption. Will check labs as below and f/u pending results. If normal may start fluid pill as below. Patient aware to take potassium anytime she takes a fluid pill. Also discussed increasing activity as tolerated, wearing compression stockings, elevating legs, decreasing  salt consumption. Call if worsening.  - CBC w/Diff/Platelet - Basic Metabolic Panel (BMET) - B Nat Peptide - furosemide (LASIX) 20 MG tablet; Take 1 tablet (20 mg total) by mouth daily.  Dispense: 30 tablet; Refill: 3 - potassium chloride SA (K-DUR) 20 MEQ tablet; Take 1 tablet (20 mEq total) by mouth daily.  Dispense: 30 tablet; Refill: [place], PA-C  [facility]

## 2018-10-11 ENCOUNTER — Telehealth: Payer: Self-pay

## 2018-10-11 LAB — BASIC METABOLIC PANEL
BUN/Creatinine Ratio: 23 (ref 12–28)
BUN: 16 mg/dL (ref 8–27)
CO2: 23 mmol/L (ref 20–29)
Calcium: 9.8 mg/dL (ref 8.7–10.3)
Chloride: 102 mmol/L (ref 96–106)
Creatinine, Ser: 0.69 mg/dL (ref 0.57–1.00)
GFR calc Af Amer: 106 mL/min/{1.73_m2} (ref >59)
GFR calc non Af Amer: 92 mL/min/{1.73_m2} (ref >59)
Glucose: 95 mg/dL (ref 65–99)
Potassium: 4.5 mmol/L (ref 3.5–5.2)
Sodium: 142 mmol/L (ref 134–144)

## 2018-10-11 LAB — CBC WITH DIFFERENTIAL/PLATELET
Basophils Absolute: 0.1 10*3/uL (ref 0.0–0.2)
Basos: 1 %
EOS (ABSOLUTE): 0.2 10*3/uL (ref 0.0–0.4)
Eos: 2 %
Hematocrit: 41.1 % (ref 34.0–46.6)
Hemoglobin: 14.5 g/dL (ref 11.1–15.9)
Immature Grans (Abs): 0 10*3/uL (ref 0.0–0.1)
Immature Granulocytes: 0 %
Lymphocytes Absolute: 2.3 10*3/uL (ref 0.7–3.1)
Lymphs: 26 %
MCH: 31 pg (ref 26.6–33.0)
MCHC: 35.3 g/dL (ref 31.5–35.7)
MCV: 88 fL (ref 79–97)
Monocytes Absolute: 0.9 10*3/uL (ref 0.1–0.9)
Monocytes: 10 %
Neutrophils Absolute: 5.4 10*3/uL (ref 1.4–7.0)
Neutrophils: 61 %
Platelets: 411 10*3/uL (ref 150–450)
RBC: 4.68 x10E6/uL (ref 3.77–5.28)
RDW: 12.8 % (ref 11.7–15.4)
WBC: 8.9 10*3/uL (ref 3.4–10.8)

## 2018-10-11 LAB — BRAIN NATRIURETIC PEPTIDE: BNP: 17.1 pg/mL (ref 0.0–100.0)

## 2018-10-11 NOTE — Telephone Encounter (Signed)
Pt advised.   Thanks,   -Laura  

## 2018-10-11 NOTE — Telephone Encounter (Signed)
LMTCB 10/11/2018   Thanks,   -Mickel Baas

## 2018-10-11 NOTE — Telephone Encounter (Signed)
Pt returned call ° °Thanks  teri °

## 2018-10-11 NOTE — Telephone Encounter (Signed)
-----   Message from Mar Daring, [place] sent at 10/11/2018  1:41 PM EDT ----- All labs are normal. Ok to start fluid pill

## 2018-10-25 ENCOUNTER — Other Ambulatory Visit: Payer: Self-pay

## 2018-10-25 ENCOUNTER — Ambulatory Visit: Payer: BLUE CROSS/BLUE SHIELD | Admitting: Podiatry

## 2018-10-25 ENCOUNTER — Encounter: Payer: Self-pay | Admitting: Podiatry

## 2018-10-25 VITALS — Temp 98.3°F

## 2018-10-25 DIAGNOSIS — M7662 Achilles tendinitis, left leg: Secondary | ICD-10-CM | POA: Diagnosis not present

## 2018-10-28 NOTE — Progress Notes (Signed)
   HPI: 63 year old female presenting today for follow up evaluation of achilles tendinitis of the left lower extremity. She reports continued pain and states the injection she may now need an injection. She reports sharp, shooting pain when she walks. She has been taking Meloxicam as directed. Patient is here for further evaluation and treatment.   Past Medical History:  Diagnosis Date  . Anxiety   . Depression   . Hyperlipidemia   . Vitamin D deficiency       Physical Exam: General: The patient is alert and oriented x3 in no acute distress.  Dermatology: Skin is warm, dry and supple bilateral lower extremities. Negative for open lesions or macerations.  Vascular: Palpable pedal pulses bilaterally. No edema or erythema noted. Capillary refill within normal limits.  Neurological: Epicritic and protective threshold grossly intact bilaterally.   Musculoskeletal Exam: Pain on palpation noted to the posterior tubercle of the left calcaneus at the insertion of the Achilles tendon consistent with retrocalcaneal bursitis. Range of motion within normal limits. Muscle strength 5/5 in all muscle groups bilateral lower extremities.  Assessment: 1. Achilles tendinitis left -recurrent   Plan of Care:  1. Patient was evaluated.  2. Injection of 0.5 mLs of Celestone Soluspan injected into the medial retrocalcaneal bursa. Care was taken to avoid direct injection into the tendon. 3. Continue taking Meloxicam daily.  4. Declined CAM boot.  5. Continue wearing good shoe gear.  6. Return to clinic in 4 weeks.   Goes by [employer].  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 Edrick Kins, [place]                                        [place], [place] [address]                Office [phone]  Fax [phone]

## 2018-11-15 ENCOUNTER — Other Ambulatory Visit: Payer: Self-pay

## 2018-11-15 ENCOUNTER — Encounter: Payer: Self-pay | Admitting: Podiatry

## 2018-11-15 ENCOUNTER — Ambulatory Visit: Payer: BLUE CROSS/BLUE SHIELD | Admitting: Podiatry

## 2018-11-15 DIAGNOSIS — M7662 Achilles tendinitis, left leg: Secondary | ICD-10-CM

## 2018-11-15 MED ORDER — GABAPENTIN 100 MG PO CAPS: 100.0000 mg | ORAL_CAPSULE | Freq: Three times a day (TID) | ORAL | 1 refills | Status: DC

## 2018-11-15 MED ORDER — MELOXICAM 15 MG PO TABS: 15.0000 mg | ORAL_TABLET | Freq: Every day | ORAL | 1 refills | Status: DC

## 2018-11-18 NOTE — Progress Notes (Signed)
   HPI: 63 year old female presenting today for follow up evaluation of achilles tendinitis of the left lower extremity. She states the injections only provided a few days of relief. She reports some mild intermittent relief from taking the Meloxicam. There are no worsening factors noted. Patient is here for further evaluation and treatment.   Past Medical History:  Diagnosis Date  . Anxiety   . Depression   . Hyperlipidemia   . Vitamin D deficiency       Physical Exam: General: The patient is alert and oriented x3 in no acute distress.  Dermatology: Skin is warm, dry and supple bilateral lower extremities. Negative for open lesions or macerations.  Vascular: Palpable pedal pulses bilaterally. No edema or erythema noted. Capillary refill within normal limits.  Neurological: Epicritic and protective threshold grossly intact bilaterally.   Musculoskeletal Exam: Pain on palpation noted to the posterior tubercle of the left calcaneus at the insertion of the Achilles tendon consistent with retrocalcaneal bursitis. Range of motion within normal limits. Muscle strength 5/5 in all muscle groups bilateral lower extremities.  Assessment: 1. Achilles tendinitis left -recurrent 2. Left heel neuritis  Plan of Care:  1. Patient was evaluated.  2. Refill prescription for Meloxicam provided to patient.  3. Prescription for Gabapentin 100 mg TID provided to patient.  4. Recommended daily stretching exercises.  5. Return to clinic in 4 weeks.    Goes by Tammy Bailey.  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 Edrick Bailey, [place]                                        [place], [place] [address]                Office [phone]  Fax [phone]

## 2018-12-13 ENCOUNTER — Encounter: Payer: Self-pay | Admitting: Podiatry

## 2018-12-13 ENCOUNTER — Other Ambulatory Visit: Payer: Self-pay

## 2018-12-13 ENCOUNTER — Ambulatory Visit: Payer: BLUE CROSS/BLUE SHIELD | Admitting: Podiatry

## 2018-12-13 DIAGNOSIS — M7662 Achilles tendinitis, left leg: Secondary | ICD-10-CM

## 2018-12-14 ENCOUNTER — Other Ambulatory Visit: Payer: Self-pay

## 2018-12-14 DIAGNOSIS — S96912A Strain of unspecified muscle and tendon at ankle and foot level, left foot, initial encounter: Secondary | ICD-10-CM

## 2018-12-14 DIAGNOSIS — M7662 Achilles tendinitis, left leg: Secondary | ICD-10-CM

## 2018-12-14 NOTE — Progress Notes (Signed)
   HPI: 63 year old female presenting today for follow up evaluation of achilles tendinitis of the left lower extremity and neuritis of the left heel. She reports continued pain. She states the injections only lasted about three days. She has been taking Meloxicam and Gabapentin as directed. There are no worsening factors noted. Patient is here for further evaluation and treatment.   Past Medical History:  Diagnosis Date  . Anxiety   . Depression   . Hyperlipidemia   . Vitamin D deficiency       Physical Exam: General: The patient is alert and oriented x3 in no acute distress.  Dermatology: Skin is warm, dry and supple bilateral lower extremities. Negative for open lesions or macerations.  Vascular: Palpable pedal pulses bilaterally. No edema or erythema noted. Capillary refill within normal limits.  Neurological: Epicritic and protective threshold grossly intact bilaterally.   Musculoskeletal Exam: Pain on palpation noted to the posterior tubercle of the left calcaneus at the insertion of the Achilles tendon consistent with retrocalcaneal bursitis. Range of motion within normal limits. Muscle strength 5/5 in all muscle groups bilateral lower extremities.  Assessment: 1. Achilles tendinitis left - recurrent 2. Left heel neuritis  Plan of Care:  1. Patient was evaluated.  2. Order for MRI placed today.  3. Return to clinic in 4 weeks.   Goes by [employer].  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 Edrick Kins, [address]                                        [place], [place] [address]                Office [phone]  Fax [phone]

## 2018-12-29 ENCOUNTER — Ambulatory Visit
Admission: RE | Admit: 2018-12-29 | Discharge: 2018-12-29 | Disposition: A | Discharge status: Home / Self Care | Payer: BLUE CROSS/BLUE SHIELD | Source: Ambulatory Visit | Attending: Podiatry | Admitting: Podiatry

## 2018-12-29 ENCOUNTER — Other Ambulatory Visit: Payer: Self-pay

## 2018-12-29 DIAGNOSIS — M7662 Achilles tendinitis, left leg: Secondary | ICD-10-CM | POA: Insufficient documentation

## 2018-12-29 DIAGNOSIS — S96912A Strain of unspecified muscle and tendon at ankle and foot level, left foot, initial encounter: Secondary | ICD-10-CM | POA: Insufficient documentation

## 2019-01-17 ENCOUNTER — Encounter: Payer: Self-pay | Admitting: Podiatry

## 2019-01-17 ENCOUNTER — Other Ambulatory Visit: Payer: Self-pay

## 2019-01-17 ENCOUNTER — Ambulatory Visit (INDEPENDENT_AMBULATORY_CARE_PROVIDER_SITE_OTHER): Payer: BLUE CROSS/BLUE SHIELD | Admitting: Podiatry

## 2019-01-17 VITALS — Temp 98.0°F

## 2019-01-17 DIAGNOSIS — M7662 Achilles tendinitis, left leg: Secondary | ICD-10-CM | POA: Diagnosis not present

## 2019-01-17 DIAGNOSIS — M7732 Calcaneal spur, left foot: Secondary | ICD-10-CM | POA: Diagnosis not present

## 2019-01-17 NOTE — Patient Instructions (Signed)
Pre-Operative Instructions  Congratulations, you have decided to take an important step towards improving your quality of life.  You can be assured that the doctors and staff at [facility] will be with you every step of the way.  Here are some important things you should know:  1. Plan to be at the surgery center/hospital at least 1 (one) hour prior to your scheduled time, unless otherwise directed by the surgical center/hospital staff.  You must have a responsible adult accompany you, remain during the surgery and drive you home.  Make sure you have directions to the surgical center/hospital to ensure you arrive on time. 2. If you are having surgery at [facility] or [facility], you will need a copy of your medical history and physical form from your family physician within one month prior to the date of surgery. We will give you a form for your primary physician to complete.  3. We make every effort to accommodate the date you request for surgery.  However, there are times where surgery dates or times have to be moved.  We will contact you as soon as possible if a change in schedule is required.   4. No aspirin/ibuprofen for one week before surgery.  If you are on aspirin, any non-steroidal anti-inflammatory medications (Mobic, Aleve, Ibuprofen) should not be taken seven (7) days prior to your surgery.  You make take Tylenol for pain prior to surgery.  5. Medications - If you are taking daily heart and blood pressure medications, seizure, reflux, allergy, asthma, anxiety, pain or diabetes medications, make sure you notify the surgery center/hospital before the day of surgery so they can tell you which medications you should take or avoid the day of surgery. 6. No food or drink after midnight the night before surgery unless directed otherwise by surgical center/hospital staff. 7. No alcoholic beverages 24-hours prior to surgery.  No smoking 24-hours prior or 24-hours after  surgery. 8. Wear loose pants or shorts. They should be loose enough to fit over bandages, boots, and casts. 9. Don't wear slip-on shoes. Sneakers are preferred. 10. Bring your boot with you to the surgery center/hospital.  Also bring crutches or a walker if your physician has prescribed it for you.  If you do not have this equipment, it will be provided for you after surgery. 11. If you have not been contacted by the surgery center/hospital by the day before your surgery, call to confirm the date and time of your surgery. 12. Leave-time from work may vary depending on the type of surgery you have.  Appropriate arrangements should be made prior to surgery with your employer. 13. Prescriptions will be provided immediately following surgery by your doctor.  Fill these as soon as possible after surgery and take the medication as directed. Pain medications will not be refilled on weekends and must be approved by the doctor. 14. Remove nail polish on the operative foot and avoid getting pedicures prior to surgery. 15. Wash the night before surgery.  The night before surgery wash the foot and leg well with water and the antibacterial soap provided. Be sure to pay special attention to beneath the toenails and in between the toes.  Wash for at least three (3) minutes. Rinse thoroughly with water and dry well with a towel.  Perform this wash unless told not to do so by your physician.  Enclosed: 1 Ice pack (please put in freezer the night before surgery)   1 Hibiclens skin cleaner     Pre-op instructions  If you have any questions regarding the instructions, please do not hesitate to call our office.  [place]: [address], [place], [place] [address] -- [phone]  [place]: [address] [place], [place] [address] -- [phone]  [place] [place]: [address]  [place], [place] [address] -- [phone]  [place]: [address], [address], [place], [place] [address] -- [phone]  Website:  [email or web address] 

## 2019-01-19 ENCOUNTER — Telehealth: Payer: Self-pay | Admitting: Podiatry

## 2019-01-19 NOTE — Telephone Encounter (Signed)
Pt called to schedule surgery. Surgery was scheduled for Thursday, 10 September. Told pt she could go ahead and register online with the surgical center, and the information is in the brochure they gave her. Also told pt they would call her a day or two prior to surgery to let her know what time to arrive. Pt asked me to fax her after visit summary with the pre-op instructions because she didn't get it at the office the other day. Told pt to call with any other questions and/or concerns.

## 2019-01-19 NOTE — Progress Notes (Signed)
   HPI: 63 year old female presenting today for follow up evaluation of achilles tendinitis of the left lower extremity and neuritis of the left heel. She reports continued pain and swelling. She has been taking Meloxicam and Gabapentin as directed for treatment. There are no worsening factors noted. Patient is here for further evaluation and treatment.   Past Medical History:  Diagnosis Date  . Anxiety   . Depression   . Hyperlipidemia   . Vitamin D deficiency       Physical Exam: General: The patient is alert and oriented x3 in no acute distress.  Dermatology: Skin is warm, dry and supple bilateral lower extremities. Negative for open lesions or macerations.  Vascular: Palpable pedal pulses bilaterally. No edema or erythema noted. Capillary refill within normal limits.  Neurological: Epicritic and protective threshold grossly intact bilaterally.   Musculoskeletal Exam: Pain on palpation noted to the posterior tubercle of the left calcaneus at the insertion of the Achilles tendon consistent with retrocalcaneal bursitis. Range of motion within normal limits. Muscle strength 5/5 in all muscle groups bilateral lower extremities.  MRI Impression:  1. Moderate distal Achilles tendinosis with small intrasubstance tear at the insertion. Associated retrocalcaneal bursitis and reactive marrow edema in the posterior calcaneus.  Assessment: 1. Achilles tendinitis left - recurrent 2. Posterior heel spur left   Plan of Care:  1. Patient was evaluated.  2. Today we discussed the conservative versus surgical management of the presenting pathology. The patient opts for surgical management. All possible complications and details of the procedure were explained. All patient questions were answered. No guarantees were expressed or implied. 3. Authorization for surgery was initiated today. Surgery will consist of retrocalcaneal exostectomy left; repair left Achilles.  4. Return to clinic one week  post op.   Goes by Tammy [employer].  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 Edrick Kins, [address]                                        [place], [place] [address]                Office [phone]  Fax [phone]

## 2019-01-23 ENCOUNTER — Telehealth: Payer: Self-pay | Admitting: *Deleted

## 2019-01-23 NOTE — Telephone Encounter (Signed)
"  I'm scheduled to have surgery on September 10.  I did everything that I was supposed to do online.  I'm not sure if I did everything correctly.  Does it look alright?"  I don't have access to that information.  That portal is only accessible to the surgical center.  If there's anything missing, someone from the surgical center will give you a call.  "Oh okay, thank you."

## 2019-02-08 ENCOUNTER — Telehealth: Payer: Self-pay | Admitting: *Deleted

## 2019-02-08 ENCOUNTER — Encounter: Payer: Self-pay | Admitting: Podiatry

## 2019-02-08 NOTE — Telephone Encounter (Signed)
"  I have a surgery on September 10 with Dr. Amalia Hailey.  I talked to my insurance today and they need for you to call the this number for the approval for it.  It's [phone].  I also need a note from Dr. Amalia Hailey stating I need to be out of work.  I have to turn that in to the office as well as here."

## 2019-02-10 ENCOUNTER — Telehealth: Payer: Self-pay | Admitting: Podiatry

## 2019-02-10 DIAGNOSIS — M7732 Calcaneal spur, left foot: Secondary | ICD-10-CM

## 2019-02-10 DIAGNOSIS — M7662 Achilles tendinitis, left leg: Secondary | ICD-10-CM

## 2019-02-10 NOTE — Telephone Encounter (Signed)
Returned call to patient and left message to call back 

## 2019-02-10 NOTE — Addendum Note (Signed)
Addended by: Lolita Rieger on: 02/10/2019 04:12 PM   Modules accepted: Orders

## 2019-02-10 NOTE — Telephone Encounter (Signed)
DOS 02/21/2019 REPAIR ACHILLES - 27650 AND CALCANEAL OSTECTOMY - [id number] LEFT FOOT  BCBS: Effective Date - 10/14/2018 - 06/14/2198   In-Network    Max Per Benefit Period Year-to-Date Remaining  CoInsurance  0%1    Deductible  $4500.00 $1435.64  Out-Of-Pocket 5  $7150.00 $4003.00   In Network  [employer] Coinsurance Authorization Required  Not Applicable  [id number] per Service Year  No  Messages: Service rendered in an inpatient setting   Not Applicable  [id number] per Service Year  No  Messages: Service rendered in an outpatient setting     I called and informed the patient that her surgery does not require authorization.

## 2019-02-10 NOTE — Telephone Encounter (Signed)
Pt had a question about her sx before she actually attempts to do it. Please call patient sx is 03/25/2019. She wants to speak to Dr. Amalia Hailey

## 2019-02-10 NOTE — Telephone Encounter (Signed)
"  I am sorry I missed your call.  I wasn't calling about a referral."  I called you to let you know that authorization is not needed for your surgery that's scheduled for 02/23/2019.  "Oh okay, I have talked to so many people.  I need to know how long I will be out of work.  I live a long.  I live off my income, so I can't be out of work too long.  This will decide if I go through with the surgery or not.  I was told that I could be out of work for six to eight weeks.  I can't afford to be out of work that long without pay."  Do you sit on your job?  "Yes, I can sit.  I have a stool that I can use to elevate my foot.  I have a wheel chair that I can use."  If you can sit and elevate, you'll be okay.  However you may want to take off a couple of weeks to be safe.  "Who do I speak to regarding disability?"  You need to speak to [place] in the [place] office.

## 2019-02-16 ENCOUNTER — Telehealth: Payer: Self-pay | Admitting: *Deleted

## 2019-02-16 NOTE — Telephone Encounter (Signed)
I'm returning your call.  I have canceled your surgery.  "I hate to have to do that but I can't take all that time off right now.  I'll have to do it later."  I understand.  "Will I need to call and cancel the Covid test that's scheduled for tomorrow?"  When I cancel the surgery they will know.    I called and informed Caren Griffins at the surgery center and I also canceled the surgery via the [employer] Portal.

## 2019-02-16 NOTE — Telephone Encounter (Signed)
"  I have an appointment with Dr. Amalia Hailey on September 10 for surgery.  Unfortunately financially, I'm not going to be able to do it.  I need to put it off for now and do it at a better date for me.  If you could, if you can cancel that for me and then give me a call and let me know everything is okay at [phone] or call me at work, I'm here until 4:30 it's [phone]."

## 2019-06-02 NOTE — Progress Notes (Signed)
Patient: Tammy Bailey, Female    DOB: April 05, 1956, 63 y.o.   MRN: [id number] Visit Date: 06/05/2019  Today's Provider: Mar Daring, PA-C   Chief Complaint  Patient presents with  . Annual Exam   Subjective:     Annual physical exam Tammy Bailey is a 63 y.o. female who presents today for health maintenance and complete physical. She feels poorly. She reports exercising, but not like she should due to pain. She reports she is sleeping well. ----------------------------------------------------------------- 08/10/2018-Cologuard negative. Repeat in 3 years  Review of Systems  Constitutional: Negative.   HENT: Negative.   Eyes: Negative.   Respiratory: Negative.   Cardiovascular: Negative.   Gastrointestinal: Negative.   Endocrine: Negative.   Genitourinary: Negative.   Musculoskeletal: Positive for arthralgias.  Skin: Negative.   Allergic/Immunologic: Negative.   Neurological: Negative.   Hematological: Negative.   Psychiatric/Behavioral: Positive for confusion. The patient is nervous/anxious.     Social History      She  reports that she has never smoked. She has never used smokeless tobacco. She reports that she does not drink alcohol or use drugs.       Social History   Socioeconomic History  . Marital status: Divorced    Spouse name: Not on file  . Number of children: Not on file  . Years of education: Not on file  . Highest education level: Not on file  Occupational History  . Not on file  Tobacco Use  . Smoking status: Never Smoker  . Smokeless tobacco: Never Used  Substance and Sexual Activity  . Alcohol use: No  . Drug use: No  . Sexual activity: Not on file  Other Topics Concern  . Not on file  Social History Narrative  . Not on file   Social Determinants of Health   Financial Resource Strain:   . Difficulty of Paying Living Expenses: Not on file  Food Insecurity:   . Worried About Charity fundraiser in the Last Year: Not on  file  . Ran Out of Food in the Last Year: Not on file  Transportation Needs:   . Lack of Transportation (Medical): Not on file  . Lack of Transportation (Non-Medical): Not on file  Physical Activity:   . Days of Exercise per Week: Not on file  . Minutes of Exercise per Session: Not on file  Stress:   . Feeling of Stress : Not on file  Social Connections:   . Frequency of Communication with Friends and Family: Not on file  . Frequency of Social Gatherings with Friends and Family: Not on file  . Attends Religious Services: Not on file  . Active Member of Clubs or Organizations: Not on file  . Attends Archivist Meetings: Not on file  . Marital Status: Not on file    Past Medical History:  Diagnosis Date  . Anxiety   . Depression   . Hyperlipidemia   . Vitamin D deficiency      Patient Active Problem List   Diagnosis Date Noted  . BMI 40.0-44.9, adult ([place]) 08/29/2018  . Class 3 severe obesity due to excess calories with serious comorbidity and body mass index (BMI) of 40.0 to 44.9 in adult ([place]) 06/03/2018  . History of esophageal stricture 04/05/2015  . Anxiety 04/02/2015  . Solitary cyst of breast 04/02/2015  . Clinical depression 04/02/2015  . Cannot sleep 04/02/2015  . Awareness of heartbeats 04/02/2015  . Post-menopausal 02/19/2015  .  Vitamin D deficiency 01/03/2015  . Dermatologic disease 08/27/2009  . Allergic rhinitis 08/01/2009  . H/O total hysterectomy 07/12/2008  . Hypercholesterolemia without hypertriglyceridemia 01/11/2003  . Acid reflux 06/02/2002  . Adult hypothyroidism 04/21/2002    Past Surgical History:  Procedure Laterality Date  . ABDOMINAL HYSTERECTOMY    . APPENDECTOMY    . BLADDER SURGERY    . CARPAL TUNNEL RELEASE     Both  . CHOLECYSTECTOMY    . KNEE SURGERY Left x's two  . TONSILLECTOMY      Family History        Family Status  Relation Name Status  . Mother  Alive  . Father  Alive       MI  . Sister 1(yuonger) Alive   . Brother  Alive  . PGM  Deceased at age 44       Coronary artery disease; lymphoma  . Sister 2 Alive        Her family history includes Diabetes in her sister; Hypertension in her father.      Allergies  Allergen Reactions  . Erythromycin      Current Outpatient Medications:  .  Aspirin-Salicylamide-Caffeine (BC HEADACHE POWDER PO), Take 1 Package by mouth as needed., Disp: , Rfl:  .  estradiol (ESTRACE) 1 MG tablet, Take 1 tablet (1 mg total) by mouth daily., Disp: 90 tablet, Rfl: 1 .  furosemide (LASIX) 20 MG tablet, Take 1 tablet (20 mg total) by mouth daily., Disp: 30 tablet, Rfl: 3 .  levothyroxine (SYNTHROID, LEVOTHROID) 150 MCG tablet, Take 1 tablet (150 mcg total) by mouth daily., Disp: 90 tablet, Rfl: 3 .  diclofenac sodium (VOLTAREN) 1 % GEL, Apply 2 g topically 4 (four) times daily. (Patient not taking: Reported on 06/05/2019), Disp: 400 g, Rfl: 3 .  escitalopram (LEXAPRO) 10 MG tablet, Take 1 tablet (10 mg total) by mouth at bedtime. (Patient not taking: Reported on 06/05/2019), Disp: 90 tablet, Rfl: 1 .  esomeprazole (NEXIUM) 20 MG packet, Take 20 mg by mouth daily before breakfast., Disp: , Rfl:  .  gabapentin (NEURONTIN) 100 MG capsule, Take 1 capsule (100 mg total) by mouth 3 (three) times daily. (Patient not taking: Reported on 06/05/2019), Disp: 90 capsule, Rfl: 1 .  meloxicam (MOBIC) 15 MG tablet, Take 1 tablet (15 mg total) by mouth daily. (Patient not taking: Reported on 06/05/2019), Disp: 30 tablet, Rfl: 1 .  potassium chloride SA (K-DUR) 20 MEQ tablet, Take 1 tablet (20 mEq total) by mouth daily. (Patient not taking: Reported on 06/05/2019), Disp: 30 tablet, Rfl: 3 .  traZODone (DESYREL) 50 MG tablet, Take 0.5-1 tablets (25-50 mg total) by mouth at bedtime as needed for sleep. (Patient not taking: Reported on 06/05/2019), Disp: 30 tablet, Rfl: 3 .  Vitamin D, Ergocalciferol, (DRISDOL) 50000 units CAPS capsule, TAKE 1 TABLET BY MOUTH EVERY 7 DAYS. (Patient not  taking: Reported on 06/05/2019), Disp: 12 capsule, Rfl: 1   Patient Care Team: Mar Daring, PA-C as PCP - General (Family Medicine)    Objective:    Vitals: BP 136/67   Pulse 64   Temp (!) 96.9 F (36.1 C) (Temporal)   Resp 18   Ht 4\' 11"  (1.499 m)   Wt 204 lb (92.5 kg)   BMI 41.20 kg/m    Vitals:   06/05/19 1416  BP: 136/67  Pulse: 64  Resp: 18  Temp: (!) 96.9 F (36.1 C)  TempSrc: Temporal  Weight: 204 lb (92.5 kg)  Height: 4'  11" (1.499 m)     Physical Exam Vitals reviewed.  Constitutional:      General: She is not in acute distress.    Appearance: Normal appearance. She is well-developed. She is obese. She is not ill-appearing or diaphoretic.  HENT:     Head: Normocephalic and atraumatic.     Right Ear: Tympanic membrane, ear canal and external ear normal.     Left Ear: Tympanic membrane, ear canal and external ear normal.     Nose: Nose normal.     Mouth/Throat:     Mouth: Mucous membranes are moist.     Pharynx: Oropharynx is clear. No oropharyngeal exudate.  Eyes:     General: No scleral icterus.       Right eye: No discharge.        Left eye: No discharge.     Extraocular Movements: Extraocular movements intact.     Conjunctiva/sclera: Conjunctivae normal.     Pupils: Pupils are equal, round, and reactive to light.  Neck:     Thyroid: No thyromegaly.     Vascular: No carotid bruit or JVD.     Trachea: No tracheal deviation.  Cardiovascular:     Rate and Rhythm: Normal rate and regular rhythm.     Pulses: Normal pulses.     Heart sounds: Normal heart sounds. No murmur. No friction rub. No gallop.   Pulmonary:     Effort: Pulmonary effort is normal. No respiratory distress.     Breath sounds: Normal breath sounds. No wheezing or rales.  Chest:     Chest wall: No tenderness.  Abdominal:     General: Abdomen is protuberant. Bowel sounds are normal. There is no distension.     Palpations: Abdomen is soft. There is no mass.     Tenderness:  There is no abdominal tenderness. There is no guarding or rebound.  Musculoskeletal:        General: No tenderness. Normal range of motion.     Cervical back: Normal range of motion and neck supple.     Right lower leg: No edema.     Left lower leg: No edema.  Lymphadenopathy:     Cervical: No cervical adenopathy.  Skin:    General: Skin is warm and dry.     Capillary Refill: Capillary refill takes less than 2 seconds.     Findings: No rash.  Neurological:     General: No focal deficit present.     Mental Status: She is alert and oriented to person, place, and time. Mental status is at baseline.  Psychiatric:        Attention and Perception: Attention and perception normal.        Mood and Affect: Mood is anxious and depressed. Affect is flat.        Speech: Speech normal.        Behavior: Behavior normal. Behavior is cooperative.        Thought Content: Thought content normal.        Cognition and Memory: Cognition and memory normal.        Judgment: Judgment normal.      Depression Screen PHQ 2/9 Scores 06/05/2019 06/03/2018 05/31/2017 11/23/2016  PHQ - 2 Score 6 6 6 5   PHQ- 9 Score 17 14 18 18        Assessment & Plan:     Routine Health Maintenance and Physical Exam  Exercise Activities and Dietary recommendations Goals   None     Immunization  History  Administered Date(s) Administered  . Influenza Inj Mdck Quad With Preservative 04/11/2018  . Influenza Split 08/27/2009  . Influenza,inj,Quad PF,6+ Mos 03/30/2014, 04/05/2015, 04/07/2016, 05/31/2017  . Influenza-Unspecified 04/06/2019  . Td 04/03/2004  . Tdap 08/27/2009  . Zoster 05/19/2013    Health Maintenance  Topic Date Due  . MAMMOGRAM  04/11/2015  . TETANUS/TDAP  08/28/2019  . Fecal DNA (Cologuard)  08/03/2021  . INFLUENZA VACCINE  Completed  . Hepatitis C Screening  Completed  . HIV Screening  Completed  . PAP SMEAR-Modifier  Discontinued     Discussed health benefits of physical activity,  and encouraged her to engage in regular exercise appropriate for her age and condition.    1. Annual physical exam Normal physical exam today. Will check labs as below and f/u pending lab results. If labs are stable and WNL she will not need to have these rechecked for one year at her next annual physical exam. She is to call the office in the meantime if she has any acute issue, questions or concerns. - CBC w/Diff/Platelet - Comprehensive Metabolic Panel (CMET) - TSH - Lipid Profile - HgB A1c  2. Encounter for breast cancer screening using non-mammogram modality Breast exam today was normal. There is no family history of breast cancer. She does perform regular self breast exams. Mammogram was ordered as below. Information for [facility] was given to patient so she may schedule her mammogram at her convenience. - CBC w/Diff/Platelet  3. Class 3 severe obesity due to excess calories with serious comorbidity and body mass index (BMI) of 40.0 to 44.9 in adult [facility]) Counseled patient on healthy lifestyle modifications including dieting and exercise. She will call if she desires pharmaceutical assistance. Names of meds were wrote on her AVS for her to research.  - CBC w/Diff/Platelet  4. Adult hypothyroidism Stable on levothyroxine 111mcg. Will check labs as below and f/u pending results. - CBC w/Diff/Platelet - TSH  5. Vitamin D deficiency H/O this and postmenopausal. Will check labs as below and f/u pending results. - CBC w/Diff/Platelet - Vitamin D (25 hydroxy)  6. Moderate episode of recurrent major depressive disorder (HCC) Worsening. Has been on Lexapro in the past. Will restart lexapro as below. I will f/u in 4-6 weeks to make sure symptoms improving.  - escitalopram (LEXAPRO) 10 MG tablet; Take 1 tablet (10 mg total) by mouth at bedtime.  Dispense: 90 tablet; Refill: 1 - CBC w/Diff/Platelet  7. Hypercholesterolemia without hypertriglyceridemia Diet controlled. Will  check labs as below and f/u pending results. - CBC w/Diff/Platelet - Comprehensive Metabolic Panel (CMET) - Lipid Profile - HgB A1c  8. Difficulty sleeping See above medical treatment plan for #6.  - escitalopram (LEXAPRO) 10 MG tablet; Take 1 tablet (10 mg total) by mouth at bedtime.  Dispense: 90 tablet; Refill: 1 - CBC w/Diff/Platelet  --------------------------------------------------------------------    Mar Daring, PA-C  [facility]

## 2019-06-05 ENCOUNTER — Other Ambulatory Visit: Payer: Self-pay

## 2019-06-05 ENCOUNTER — Ambulatory Visit (INDEPENDENT_AMBULATORY_CARE_PROVIDER_SITE_OTHER): Payer: BLUE CROSS/BLUE SHIELD | Admitting: Physician Assistant

## 2019-06-05 ENCOUNTER — Encounter: Payer: Self-pay | Admitting: Physician Assistant

## 2019-06-05 VITALS — BP 136/67 | HR 64 | Temp 96.9°F | Resp 18 | Ht 59.0 in | Wt 204.0 lb

## 2019-06-05 DIAGNOSIS — F331 Major depressive disorder, recurrent, moderate: Secondary | ICD-10-CM | POA: Diagnosis not present

## 2019-06-05 DIAGNOSIS — Z6841 Body Mass Index (BMI) 40.0 and over, adult: Secondary | ICD-10-CM

## 2019-06-05 DIAGNOSIS — E039 Hypothyroidism, unspecified: Secondary | ICD-10-CM | POA: Diagnosis not present

## 2019-06-05 DIAGNOSIS — Z1239 Encounter for other screening for malignant neoplasm of breast: Secondary | ICD-10-CM | POA: Diagnosis not present

## 2019-06-05 DIAGNOSIS — Z0000 Encounter for general adult medical examination without abnormal findings: Secondary | ICD-10-CM

## 2019-06-05 DIAGNOSIS — G479 Sleep disorder, unspecified: Secondary | ICD-10-CM

## 2019-06-05 DIAGNOSIS — E7800 Pure hypercholesterolemia, unspecified: Secondary | ICD-10-CM

## 2019-06-05 DIAGNOSIS — E559 Vitamin D deficiency, unspecified: Secondary | ICD-10-CM

## 2019-06-05 MED ORDER — ESCITALOPRAM OXALATE 10 MG PO TABS: 10.0000 mg | ORAL_TABLET | Freq: Every day | ORAL | 1 refills | Status: DC

## 2019-06-05 NOTE — Patient Instructions (Addendum)
Health Maintenance for Postmenopausal Women Menopause is a normal process in which your ability to get pregnant comes to an end. This process happens slowly over many months or years, usually between the ages of 48 and 55. Menopause is complete when you have missed your menstrual periods for 12 months. It is important to talk with your health care provider about some of the most common conditions that affect women after menopause (postmenopausal women). These include heart disease, cancer, and bone loss (osteoporosis). Adopting a healthy lifestyle and getting preventive care can help to promote your health and wellness. The actions you take can also lower your chances of developing some of these common conditions. What should I know about menopause? During menopause, you may get a number of symptoms, such as:  Hot flashes. These can be moderate or severe.  Night sweats.  Decrease in sex drive.  Mood swings.  Headaches.  Tiredness.  Irritability.  Memory problems.  Insomnia. Choosing to treat or not to treat these symptoms is a decision that you make with your health care provider. Do I need hormone replacement therapy?  Hormone replacement therapy is effective in treating symptoms that are caused by menopause, such as hot flashes and night sweats.  Hormone replacement carries certain risks, especially as you become older. If you are thinking about using estrogen or estrogen with progestin, discuss the benefits and risks with your health care provider. What is my risk for heart disease and stroke? The risk of heart disease, heart attack, and stroke increases as you age. One of the causes may be a change in the body's hormones during menopause. This can affect how your body uses dietary fats, triglycerides, and cholesterol. Heart attack and stroke are medical emergencies. There are many things that you can do to help prevent heart disease and stroke. Watch your blood pressure  High  blood pressure causes heart disease and increases the risk of stroke. This is more likely to develop in people who have high blood pressure readings, are of African descent, or are overweight.  Have your blood pressure checked: ? Every 3-5 years if you are 18-39 years of age. ? Every year if you are 40 years old or older. Eat a healthy diet   Eat a diet that includes plenty of vegetables, fruits, low-fat dairy products, and lean protein.  Do not eat a lot of foods that are high in solid fats, added sugars, or sodium. Get regular exercise Get regular exercise. This is one of the most important things you can do for your health. Most adults should:  Try to exercise for at least 150 minutes each week. The exercise should increase your heart rate and make you sweat (moderate-intensity exercise).  Try to do strengthening exercises at least twice each week. Do these in addition to the moderate-intensity exercise.  Spend less time sitting. Even light physical activity can be beneficial. Other tips  Work with your health care provider to achieve or maintain a healthy weight.  Do not use any products that contain nicotine or tobacco, such as cigarettes, e-cigarettes, and chewing tobacco. If you need help quitting, ask your health care provider.  Know your numbers. Ask your health care provider to check your cholesterol and your blood sugar (glucose). Continue to have your blood tested as directed by your health care provider. Do I need screening for cancer? Depending on your health history and family history, you may need to have cancer screening at different stages of your life. This   may include screening for:  Breast cancer.  Cervical cancer.  Lung cancer.  Colorectal cancer. What is my risk for osteoporosis? After menopause, you may be at increased risk for osteoporosis. Osteoporosis is a condition in which bone destruction happens more quickly than new bone creation. To help prevent  osteoporosis or the bone fractures that can happen because of osteoporosis, you may take the following actions:  If you are 65-22 years old, get at least 1,000 mg of calcium and at least 600 mg of vitamin D per day.  If you are older than age 71 but younger than age 6, get at least 1,200 mg of calcium and at least 600 mg of vitamin D per day.  If you are older than age 48, get at least 1,200 mg of calcium and at least 800 mg of vitamin D per day. Smoking and drinking excessive alcohol increase the risk of osteoporosis. Eat foods that are rich in calcium and vitamin D, and do weight-bearing exercises several times each week as directed by your health care provider. How does menopause affect my mental health? Depression may occur at any age, but it is more common as you become older. Common symptoms of depression include:  Low or sad mood.  Changes in sleep patterns.  Changes in appetite or eating patterns.  Feeling an overall lack of motivation or enjoyment of activities that you previously enjoyed.  Frequent crying spells. Talk with your health care provider if you think that you are experiencing depression. General instructions See your health care provider for regular wellness exams and vaccines. This may include:  Scheduling regular health, dental, and eye exams.  Getting and maintaining your vaccines. These include: ? Influenza vaccine. Get this vaccine each year before the flu season begins. ? Pneumonia vaccine. ? Shingles vaccine. ? Tetanus, diphtheria, and pertussis (Tdap) booster vaccine. Your health care provider may also recommend other immunizations. Tell your health care provider if you have ever been abused or do not feel safe at home. Summary  Menopause is a normal process in which your ability to get pregnant comes to an end.  This condition causes hot flashes, night sweats, decreased interest in sex, mood swings, headaches, or lack of sleep.  Treatment for this  condition may include hormone replacement therapy.  Take actions to keep yourself healthy, including exercising regularly, eating a healthy diet, watching your weight, and checking your blood pressure and blood sugar levels.  Get screened for cancer and depression. Make sure that you are up to date with all your vaccines. This information is not intended to replace advice given to you by your health care provider. Make sure you discuss any questions you have with your health care provider. Document Released: 07/24/2005 Document Revised: 05/25/2018 Document Reviewed: 05/25/2018 Elsevier Patient Education  [place]. Escitalopram tablets What is this medicine? ESCITALOPRAM (es sye TAL oh pram) is used to treat depression and certain types of anxiety. This medicine may be used for other purposes; ask your health care provider or pharmacist if you have questions. COMMON BRAND NAME(S): Lexapro What should I tell my health care provider before I take this medicine? They need to know if you have any of these conditions:  bipolar disorder or a family history of bipolar disorder  diabetes  glaucoma  heart disease  kidney or liver disease  receiving electroconvulsive therapy  seizures (convulsions)  suicidal thoughts, plans, or attempt by you or a family member  an unusual or allergic reaction to  escitalopram, the related drug citalopram, other medicines, foods, dyes, or preservatives  pregnant or trying to become pregnant  breast-feeding How should I use this medicine? Take this medicine by mouth with a glass of water. Follow the directions on the prescription label. You can take it with or without food. If it upsets your stomach, take it with food. Take your medicine at regular intervals. Do not take it more often than directed. Do not stop taking this medicine suddenly except upon the advice of your doctor. Stopping this medicine too quickly may cause serious side effects or  your condition may worsen. A special MedGuide will be given to you by the pharmacist with each prescription and refill. Be sure to read this information carefully each time. Talk to your pediatrician regarding the use of this medicine in children. Special care may be needed. Overdosage: If you think you have taken too much of this medicine contact a poison control center or emergency room at once. NOTE: This medicine is only for you. Do not share this medicine with others. What if I miss a dose? If you miss a dose, take it as soon as you can. If it is almost time for your next dose, take only that dose. Do not take double or extra doses. What may interact with this medicine? Do not take this medicine with any of the following medications:  certain medicines for fungal infections like fluconazole, itraconazole, ketoconazole, posaconazole, voriconazole  cisapride  citalopram  dronedarone  linezolid  MAOIs like Carbex, Eldepryl, Marplan, Nardil, and Parnate  methylene blue (injected into a vein)  pimozide  thioridazine This medicine may also interact with the following medications:  alcohol  amphetamines  aspirin and aspirin-like medicines  carbamazepine  certain medicines for depression, anxiety, or psychotic disturbances  certain medicines for migraine headache like almotriptan, eletriptan, frovatriptan, naratriptan, rizatriptan, sumatriptan, zolmitriptan  certain medicines for sleep  certain medicines that treat or prevent blood clots like warfarin, enoxaparin, dalteparin  cimetidine  diuretics  dofetilide  fentanyl  furazolidone  isoniazid  lithium  metoprolol  NSAIDs, medicines for pain and inflammation, like ibuprofen or naproxen  other medicines that prolong the QT interval (cause an abnormal heart rhythm)  procarbazine  rasagiline  supplements like St. John's wort, kava kava, valerian  tramadol  tryptophan  ziprasidone This list may not  describe all possible interactions. Give your health care provider a list of all the medicines, herbs, non-prescription drugs, or dietary supplements you use. Also tell them if you smoke, drink alcohol, or use illegal drugs. Some items may interact with your medicine. What should I watch for while using this medicine? Tell your doctor if your symptoms do not get better or if they get worse. Visit your doctor or health care professional for regular checks on your progress. Because it may take several weeks to see the full effects of this medicine, it is important to continue your treatment as prescribed by your doctor. Patients and their families should watch out for new or worsening thoughts of suicide or depression. Also watch out for sudden changes in feelings such as feeling anxious, agitated, panicky, irritable, hostile, aggressive, impulsive, severely restless, overly excited and hyperactive, or not being able to sleep. If this happens, especially at the beginning of treatment or after a change in dose, call your health care professional. Dennis Bast may get drowsy or dizzy. Do not drive, use machinery, or do anything that needs mental alertness until you know how this medicine affects you. Do  not stand or sit up quickly, especially if you are an older patient. This reduces the risk of dizzy or fainting spells. Alcohol may interfere with the effect of this medicine. Avoid alcoholic drinks. Your mouth may get dry. Chewing sugarless gum or sucking hard candy, and drinking plenty of water may help. Contact your doctor if the problem does not go away or is severe. What side effects may I notice from receiving this medicine? Side effects that you should report to your doctor or health care professional as soon as possible:  allergic reactions like skin rash, itching or hives, swelling of the face, lips, or tongue  anxious  black, tarry stools  changes in vision  confusion  elevated mood, decreased need  for sleep, racing thoughts, impulsive behavior  eye pain  fast, irregular heartbeat  feeling faint or lightheaded, falls  feeling agitated, angry, or irritable  hallucination, loss of contact with reality  loss of balance or coordination  loss of memory  painful or prolonged erections  restlessness, pacing, inability to keep still  seizures  stiff muscles  suicidal thoughts or other mood changes  trouble sleeping  unusual bleeding or bruising  unusually weak or tired  vomiting Side effects that usually do not require medical attention (report to your doctor or health care professional if they continue or are bothersome):  changes in appetite  change in sex drive or performance  headache  increased sweating  indigestion, nausea  tremors This list may not describe all possible side effects. Call your doctor for medical advice about side effects. You may report side effects to FDA at 1-800-FDA-1088. Where should I keep my medicine? Keep out of reach of children. Store at room temperature between 15 and 30 degrees C (59 and 86 degrees F). Throw away any unused medicine after the expiration date. NOTE: This sheet is a summary. It may not cover all possible information. If you have questions about this medicine, talk to your doctor, pharmacist, or health care provider.  2020 Elsevier/Gold Standard (2018-05-23 11:21:44)

## 2019-06-13 ENCOUNTER — Telehealth: Payer: Self-pay

## 2019-06-13 ENCOUNTER — Other Ambulatory Visit: Payer: Self-pay

## 2019-06-13 DIAGNOSIS — E039 Hypothyroidism, unspecified: Secondary | ICD-10-CM

## 2019-06-13 LAB — LIPID PANEL
Chol/HDL Ratio: 2.9 ratio (ref 0.0–4.4)
Cholesterol, Total: 192 mg/dL (ref 100–199)
HDL: 67 mg/dL (ref >39)
LDL Chol Calc (NIH): 108 mg/dL — ABNORMAL HIGH (ref 0–99)
Triglycerides: 94 mg/dL (ref 0–149)
VLDL Cholesterol Cal: 17 mg/dL (ref 5–40)

## 2019-06-13 LAB — COMPREHENSIVE METABOLIC PANEL
ALT: 8 IU/L (ref 0–32)
AST: 15 IU/L (ref 0–40)
Albumin/Globulin Ratio: 1.7 (ref 1.2–2.2)
Albumin: 4.1 g/dL (ref 3.8–4.8)
Alkaline Phosphatase: 100 IU/L (ref 39–117)
BUN/Creatinine Ratio: 18 (ref 12–28)
BUN: 12 mg/dL (ref 8–27)
Bilirubin Total: 0.3 mg/dL (ref 0.0–1.2)
CO2: 21 mmol/L (ref 20–29)
Calcium: 9.1 mg/dL (ref 8.7–10.3)
Chloride: 106 mmol/L (ref 96–106)
Creatinine, Ser: 0.65 mg/dL (ref 0.57–1.00)
GFR calc Af Amer: 109 mL/min/{1.73_m2} (ref >59)
GFR calc non Af Amer: 94 mL/min/{1.73_m2} (ref >59)
Globulin, Total: 2.4 g/dL (ref 1.5–4.5)
Glucose: 81 mg/dL (ref 65–99)
Potassium: 4.1 mmol/L (ref 3.5–5.2)
Sodium: 139 mmol/L (ref 134–144)
Total Protein: 6.5 g/dL (ref 6.0–8.5)

## 2019-06-13 LAB — CBC WITH DIFFERENTIAL/PLATELET
Basophils Absolute: 0.1 10*3/uL (ref 0.0–0.2)
Basos: 1 %
EOS (ABSOLUTE): 0.1 10*3/uL (ref 0.0–0.4)
Eos: 3 %
Hematocrit: 39.1 % (ref 34.0–46.6)
Hemoglobin: 13.5 g/dL (ref 11.1–15.9)
Immature Grans (Abs): 0 10*3/uL (ref 0.0–0.1)
Immature Granulocytes: 0 %
Lymphocytes Absolute: 1.6 10*3/uL (ref 0.7–3.1)
Lymphs: 30 %
MCH: 30.2 pg (ref 26.6–33.0)
MCHC: 34.5 g/dL (ref 31.5–35.7)
MCV: 88 fL (ref 79–97)
Monocytes Absolute: 0.5 10*3/uL (ref 0.1–0.9)
Monocytes: 10 %
Neutrophils Absolute: 3 10*3/uL (ref 1.4–7.0)
Neutrophils: 56 %
Platelets: 344 10*3/uL (ref 150–450)
RBC: 4.47 x10E6/uL (ref 3.77–5.28)
RDW: 12.4 % (ref 11.7–15.4)
WBC: 5.3 10*3/uL (ref 3.4–10.8)

## 2019-06-13 LAB — HEMOGLOBIN A1C
Est. average glucose Bld gHb Est-mCnc: 100 mg/dL
Hgb A1c MFr Bld: 5.1 % (ref 4.8–5.6)

## 2019-06-13 LAB — VITAMIN D 25 HYDROXY (VIT D DEFICIENCY, FRACTURES): Vit D, 25-Hydroxy: 15.5 ng/mL — ABNORMAL LOW (ref 30.0–100.0)

## 2019-06-13 LAB — TSH: TSH: 0.081 u[IU]/mL — ABNORMAL LOW (ref 0.450–4.500)

## 2019-06-13 MED ORDER — LEVOTHYROXINE SODIUM 137 MCG PO TABS: 137.0000 ug | ORAL_TABLET | Freq: Every day | ORAL | 3 refills | Status: DC

## 2019-06-13 MED ORDER — VITAMIN D (ERGOCALCIFEROL) 1.25 MG (50000 UNIT) PO CAPS: 50000.0000 [IU] | ORAL_CAPSULE | ORAL | 1 refills | Status: DC

## 2019-06-13 NOTE — Addendum Note (Signed)
Addended by: Mar Daring on: 06/13/2019 01:31 PM   Modules accepted: Orders

## 2019-06-13 NOTE — Telephone Encounter (Signed)
lmtcb

## 2019-06-13 NOTE — Telephone Encounter (Signed)
-----   Message from Mar Daring, [place] sent at 06/13/2019  1:31 PM EST ----- Blood count is normal. Kidney and liver function are normal. Sodium, potassium and calcium are normal. Thyroid is slightly overcorrected. May benefit by decreasing levothyroxine to 1105mcg. Cholesterol is normal and stable. A1c/sugar is normal. Vit D is low. May benefit from high dose Vit D supplement once weekly. I will send in.

## 2019-06-13 NOTE — Telephone Encounter (Signed)
Copied from [place] [id number]. Topic: Quick Communication - Lab Results (Clinic Use ONLY) >> Jun 13, 2019 11:42 AM Robina Ade, Helene Kelp D wrote: Patient is calling the office because she said she received a text saying that her labs were back. She would like a call back from [place] or her CMA to go over them.

## 2019-06-15 NOTE — Telephone Encounter (Signed)
Patient reports she has been notified of lab results.

## 2019-06-15 NOTE — Telephone Encounter (Signed)
lmtcb

## 2019-06-15 NOTE — Telephone Encounter (Signed)
Patient advised as below.  

## 2019-07-03 ENCOUNTER — Ambulatory Visit: Payer: Self-pay | Admitting: Physician Assistant

## 2019-07-16 ENCOUNTER — Other Ambulatory Visit: Payer: Self-pay | Admitting: Physician Assistant

## 2019-07-16 DIAGNOSIS — Z780 Asymptomatic menopausal state: Secondary | ICD-10-CM

## 2019-07-16 NOTE — Telephone Encounter (Signed)
Requested medication (s) are due for refill today: yes  Requested medication (s) are on the active medication list: yes  Last refill: 05/20/2019  Future visit scheduled: no  Notes to clinic:  script expired Review for refill   Requested Prescriptions  Pending Prescriptions Disp Refills   estradiol (ESTRACE) 1 MG tablet [Pharmacy Med Name: ESTRADIOL 1 MG TAB] 90 tablet 1    Sig: TAKE 1 TABLET BY MOUTH ONCE DAILY      OB/GYN:  Estrogens Failed - 07/16/2019  3:19 PM      Failed - Mammogram is up-to-date per Health Maintenance      Passed - Last BP in normal range    BP Readings from Last 1 Encounters:  06/05/19 136/67          Passed - Valid encounter within last 12 months    Recent Outpatient Visits           1 month ago Annual physical exam   [place], [place]   9 months ago Generalized edema   [place], [place]   1 year ago Chronic pain of both knees   [facility] [place], [place] Sorrel, [place]   1 year ago Annual physical exam   [place], [place]   1 year ago Achilles tendinitis of left lower extremity   [facility] [place], [place], [place]

## 2019-07-31 IMAGING — CR DG KNEE COMPLETE 4+V*R*
1 series · 4 of 4 positions shown · non-contrast
Comparison: None.

CLINICAL DATA: Initial evaluation for anterior knee pain for 1
year.

EXAM:
RIGHT KNEE - COMPLETE 4+ VIEW

[Series 1: dg knee 4 v w/ sunrise/patella right · 0.14mm/px · 4 of 4 slices shown]
[im 1/4]
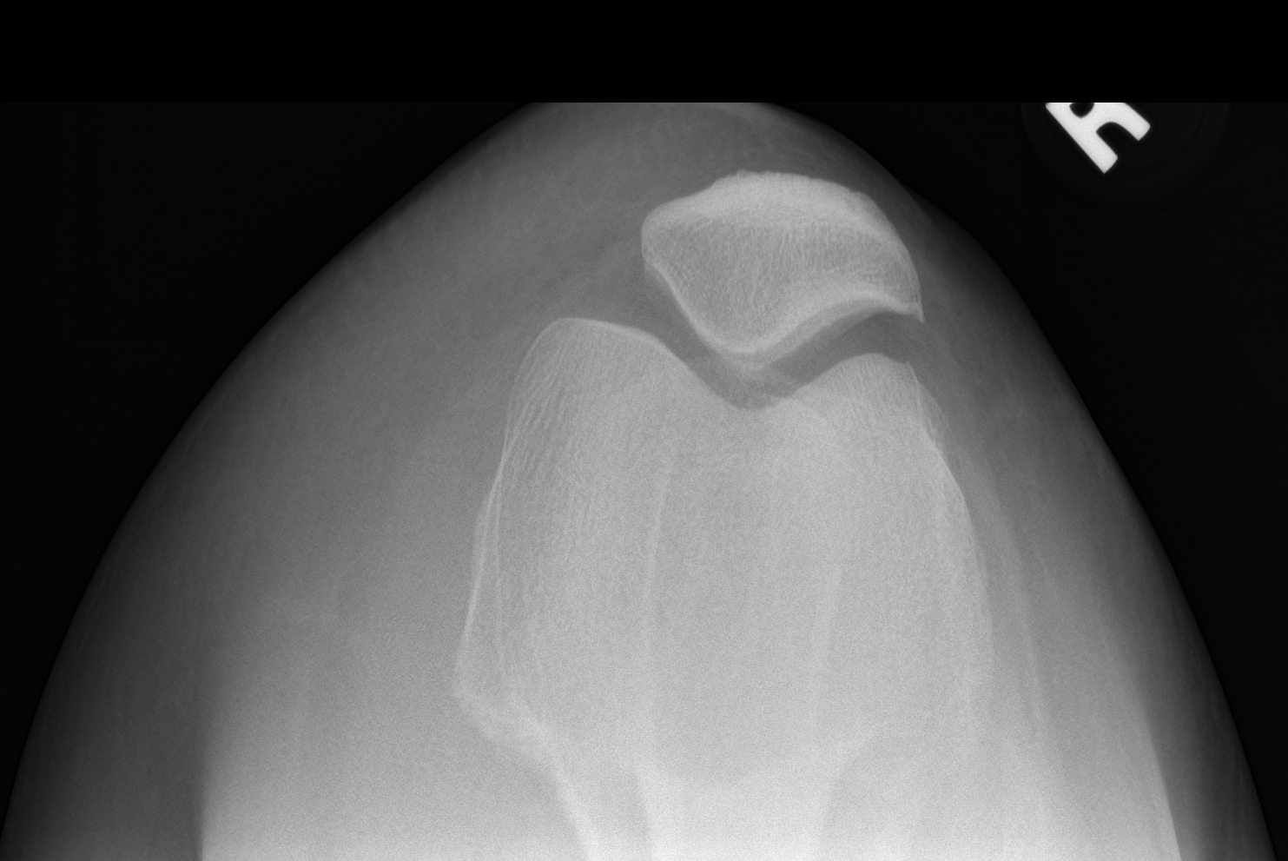
[im 2/4]
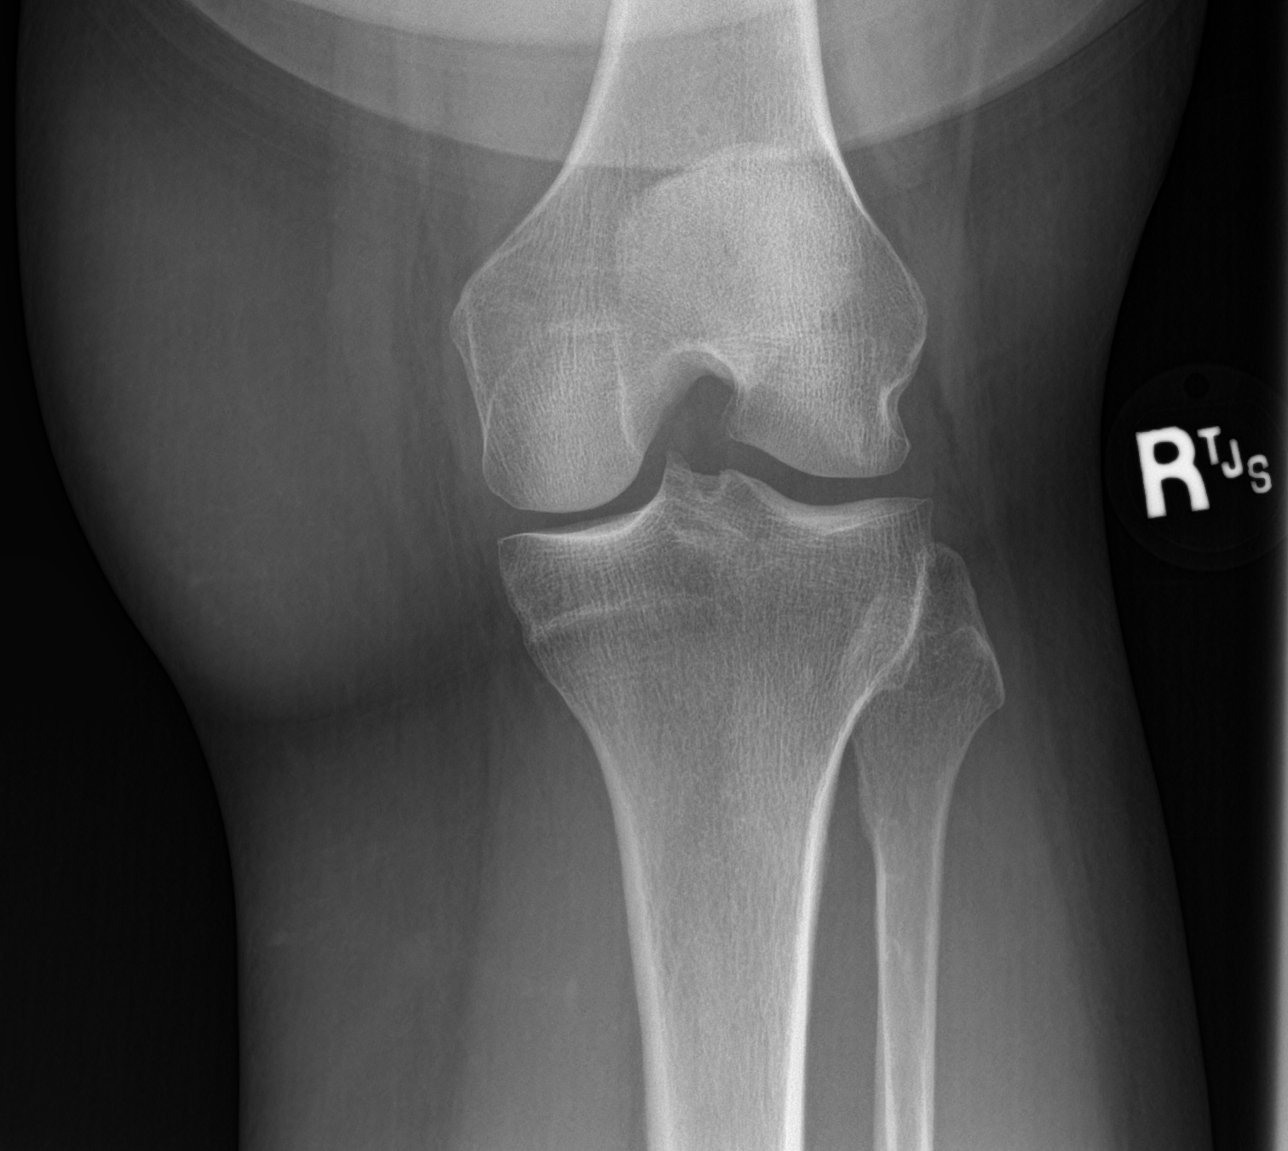
[im 3/4]
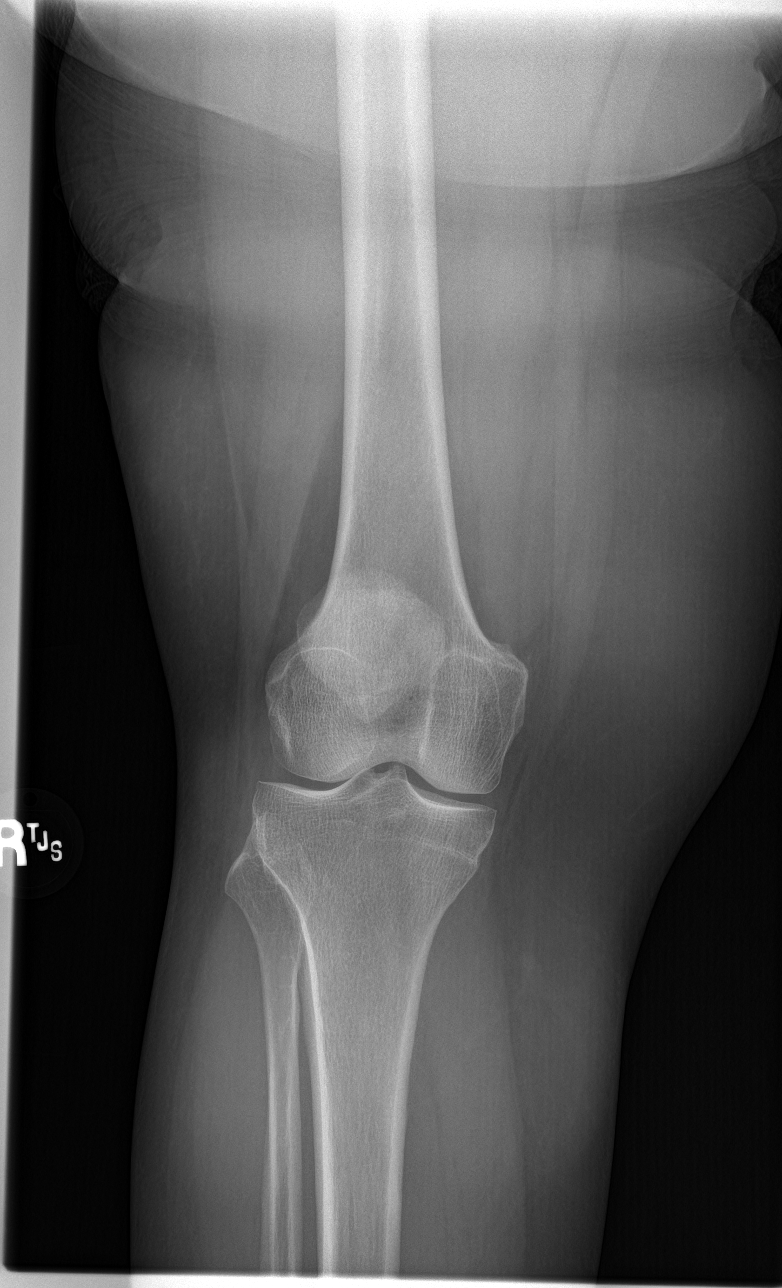
[im 4/4]
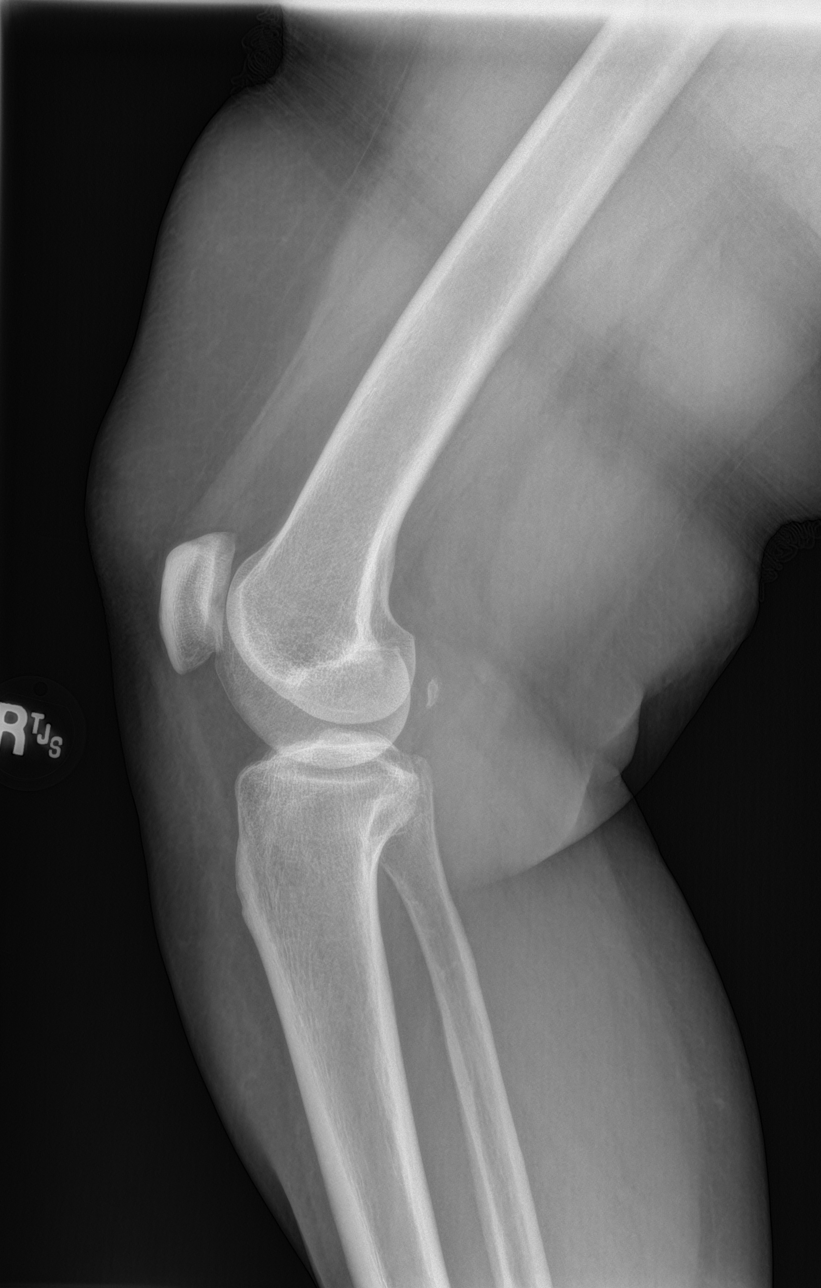

[4 of 4 positions shown; findings below may reference images not displayed]

FINDINGS: No acute fracture dislocation. No joint effusion. Minimal
juxta-articular spurring seen about the right patellofemoral
articulation. Osseous mineralization normal. No soft tissue
abnormality.
IMPRESSION: 1. Minimal degenerative osteophytic spurring about the
patellofemoral articulation.
2. Otherwise negative radiographs of the right knee.

## 2019-08-18 ENCOUNTER — Ambulatory Visit: Payer: BLUE CROSS/BLUE SHIELD | Attending: Internal Medicine

## 2019-08-18 DIAGNOSIS — Z23 Encounter for immunization: Secondary | ICD-10-CM | POA: Insufficient documentation

## 2019-08-18 NOTE — Progress Notes (Signed)
   Covid-19 Vaccination Clinic  Name:  Tammy Bailey    MRN: [id number] DOB: [birth year]  08/18/2019  [name] was observed post Covid-19 immunization for 15 minutes without incident. She was provided with Vaccine Information Sheet and instruction to access the V-Safe system.   [name] was instructed to call 911 with any severe reactions post vaccine: Marland Kitchen Difficulty breathing  . Swelling of face and throat  . A fast heartbeat  . A bad rash all over body  . Dizziness and weakness   Immunizations Administered    Name Date Dose VIS Date Route   Pfizer COVID-19 Vaccine 08/18/2019 10:51 AM 0.3 mL 05/26/2019 Intramuscular   Manufacturer: [place]   Lot: [id number]   [place]: [id number]

## 2019-09-08 ENCOUNTER — Ambulatory Visit: Payer: BLUE CROSS/BLUE SHIELD | Attending: Internal Medicine

## 2019-09-08 DIAGNOSIS — Z23 Encounter for immunization: Secondary | ICD-10-CM

## 2019-09-08 NOTE — Progress Notes (Signed)
   Covid-19 Vaccination Clinic  Name:  Tammy Bailey    MRN: [id number] DOB: 08-Sep-1955  09/08/2019  Tammy Bailey was observed post Covid-19 immunization for 15 minutes without incident. She was provided with Vaccine Information Sheet and instruction to access the V-Safe system.   Tammy Bailey was instructed to call 911 with any severe reactions post vaccine: Marland Kitchen Difficulty breathing  . Swelling of face and throat  . A fast heartbeat  . A bad rash all over body  . Dizziness and weakness   Immunizations Administered    Name Date Dose VIS Date Route   Pfizer COVID-19 Vaccine 09/08/2019  9:23 AM 0.3 mL 05/26/2019 Intramuscular   Manufacturer: [place]   Lot: [id number]   [place]: [id number]

## 2019-09-11 ENCOUNTER — Telehealth: Payer: Self-pay

## 2019-09-11 NOTE — Telephone Encounter (Signed)
Patient advised.

## 2019-09-11 NOTE — Telephone Encounter (Signed)
Copied from [place] [id number]. Topic: General - Other >> Sep 11, 2019  9:02 AM Celene Kras wrote: Reason for CRM: Pt called stating that she went to an urgent care and is requesting to know if her results from her culture were back. Pt states if they are she would like a nurse to call her to go over them with her. Please advise.

## 2019-09-11 NOTE — Telephone Encounter (Signed)
I don't see any results. Do you see results from urgent care for her?

## 2019-09-11 NOTE — Telephone Encounter (Signed)
I cannot see these notes or results. She will have to call the urgent care

## 2019-11-14 ENCOUNTER — Telehealth: Payer: Self-pay

## 2019-11-14 DIAGNOSIS — E034 Atrophy of thyroid (acquired): Secondary | ICD-10-CM

## 2019-11-14 NOTE — Telephone Encounter (Signed)
Labs ordered.

## 2019-11-14 NOTE — Telephone Encounter (Signed)
Patient advised as directed below. 

## 2019-11-14 NOTE — Telephone Encounter (Signed)
Copied from [place] [id number]. Topic: General - Other >> Nov 14, 2019  8:48 AM Marya Landry D wrote: Reason for CRM: Patient states she may be having some issues wit her thyroids, shes been real tired last two weeks and wants to know If she should be seen or If she can have lab orders,she states shes traveling out of town within next two weeks and wants to know results just in case she needs a new prescription

## 2019-11-16 DIAGNOSIS — E034 Atrophy of thyroid (acquired): Secondary | ICD-10-CM | POA: Diagnosis not present

## 2019-11-17 ENCOUNTER — Telehealth: Payer: Self-pay

## 2019-11-17 LAB — T4 AND TSH
T4, Total: 10.3 ug/dL (ref 4.5–12.0)
TSH: 0.776 u[IU]/mL (ref 0.450–4.500)

## 2019-11-17 NOTE — Telephone Encounter (Signed)
LMTCB or to view results on my chart. If patient calls back ok for [facility] nurse to give results.

## 2019-11-17 NOTE — Telephone Encounter (Signed)
Reviewed physician's note with the patient. No further questions.

## 2019-11-17 NOTE — Telephone Encounter (Signed)
-----   Message from Mar Daring, [place] sent at 11/17/2019  1:13 PM EDT ----- Thyroid is normal. It is not source of symptoms

## 2019-11-18 ENCOUNTER — Other Ambulatory Visit: Payer: Self-pay | Admitting: Physician Assistant

## 2019-11-18 DIAGNOSIS — E559 Vitamin D deficiency, unspecified: Secondary | ICD-10-CM

## 2019-11-18 NOTE — Telephone Encounter (Signed)
Requested medication (s) are due for refill today: yes  Requested medication (s) are on the active medication list: yes  Last refill:  06/13/19  Future visit scheduled: no  Notes to clinic:  medication not delegated to NT to rf   Requested Prescriptions  Pending Prescriptions Disp Refills   Vitamin D, Ergocalciferol, (DRISDOL) 1.25 MG (50000 UNIT) CAPS capsule [Pharmacy Med Name: VITAMIN D (ERGOCALCIFEROL) 1.25 MG] 12 capsule 1    Sig: TAKE 1 CAPSULE BY MOUTH EVERY 7 DAYS      Endocrinology:  Vitamins - Vitamin D Supplementation Failed - 11/18/2019  9:46 AM      Failed - 50,000 IU strengths are not delegated      Failed - Phosphate in normal range and within 360 days    No results found for: PHOS        Failed - Vitamin D in normal range and within 360 days    Vit D, 25-Hydroxy  Date Value Ref Range Status  06/12/2019 15.5 (L) 30.0 - 100.0 ng/mL Final    Comment:    Vitamin D deficiency has been defined by the Institute of Medicine and an Endocrine Society practice guideline as a level of serum 25-OH vitamin D less than 20 ng/mL (1,2). The Endocrine Society went on to further define vitamin D insufficiency as a level between 21 and 29 ng/mL (2). 1. IOM (Institute of Medicine). 2010. Dietary reference    intakes for calcium and D. [place]: The    [employer]. 2. Holick MF, Binkley [place], Bischoff-Ferrari HA, et al.    Evaluation, treatment, and prevention of vitamin D    deficiency: an Endocrine Society clinical practice    guideline. JCEM. 2011 Jul; 96(7):1911-30.           Failed - Valid encounter within last 12 months    Recent Outpatient Visits           5 months ago Annual physical exam   [facility] [place], Clearnce Sorrel, [place]   1 year ago Generalized edema   [place], [place], [place]   1 year ago Chronic pain of both knees   [place], [place], [place]   1 year ago Annual  physical exam   [facility] [place], [place]   2 years ago Achilles tendinitis of left lower extremity   [facility] [place], [place]              Passed - Ca in normal range and within 360 days    Calcium  Date Value Ref Range Status  06/12/2019 9.1 8.7 - 10.3 mg/dL Final

## 2019-12-29 ENCOUNTER — Ambulatory Visit: Payer: BLUE CROSS/BLUE SHIELD | Admitting: Podiatry

## 2020-02-01 ENCOUNTER — Ambulatory Visit: Payer: 59 | Admitting: Physician Assistant

## 2020-02-01 ENCOUNTER — Encounter: Payer: Self-pay | Admitting: Physician Assistant

## 2020-02-01 ENCOUNTER — Other Ambulatory Visit: Payer: Self-pay

## 2020-02-01 VITALS — BP 144/79 | HR 74 | Temp 98.2°F | Resp 16 | Wt 208.4 lb

## 2020-02-01 DIAGNOSIS — R5383 Other fatigue: Secondary | ICD-10-CM

## 2020-02-01 NOTE — Patient Instructions (Signed)

## 2020-02-01 NOTE — Progress Notes (Signed)
Established patient visit   Patient: Tammy Bailey   DOB: Mar 23, 1956   64 y.o. Female  MRN: [id number] Visit Date: 02/01/2020  Today's healthcare provider: Mar Daring, PA-C   Chief Complaint  Patient presents with  . Fatigue   Subjective    HPI  Patient presents in office today with complaints of feeling lethargic/fatigued this is a chronic problem that has worsened recently. She has been feeling like this for 3-4 months now. Reports that when she get home she just wants to go sleep. She feels tired through the whole day.   Does have a lot of stress and is working a lot. Works for [employer] in [place] and has been short staffed so she is working a lot more. She does not know if she snores. She does not wake feeling well rested.    Patient Active Problem List   Diagnosis Date Noted  . BMI 40.0-44.9, adult ([place]) 08/29/2018  . Class 3 severe obesity due to excess calories with serious comorbidity and body mass index (BMI) of 40.0 to 44.9 in adult ([place]) 06/03/2018  . History of esophageal stricture 04/05/2015  . Anxiety 04/02/2015  . Solitary cyst of breast 04/02/2015  . Clinical depression 04/02/2015  . Cannot sleep 04/02/2015  . Awareness of heartbeats 04/02/2015  . Post-menopausal 02/19/2015  . Vitamin D deficiency 01/03/2015  . Dermatologic disease 08/27/2009  . Allergic rhinitis 08/01/2009  . H/O total hysterectomy 07/12/2008  . Hypercholesterolemia without hypertriglyceridemia 01/11/2003  . Acid reflux 06/02/2002  . Adult hypothyroidism 04/21/2002   Past Medical History:  Diagnosis Date  . Anxiety   . Depression   . Hyperlipidemia   . Vitamin D deficiency    Social History   Tobacco Use  . Smoking status: Never Smoker  . Smokeless tobacco: Never Used  Vaping Use  . Vaping Use: Never used  Substance Use Topics  . Alcohol use: No  . Drug use: No   Allergies  Allergen Reactions  . Erythromycin        Medications: Outpatient  Medications Prior to Visit  Medication Sig  . Aspirin-Salicylamide-Caffeine (BC HEADACHE POWDER PO) Take 1 Package by mouth as needed.  Marland Kitchen esomeprazole (NEXIUM) 20 MG packet Take 20 mg by mouth daily before breakfast.  . estradiol (ESTRACE) 1 MG tablet TAKE 1 TABLET BY MOUTH ONCE DAILY  . levothyroxine (SYNTHROID) 137 MCG tablet Take 1 tablet (137 mcg total) by mouth daily.  . Vitamin D, Ergocalciferol, (DRISDOL) 1.25 MG (50000 UNIT) CAPS capsule TAKE 1 CAPSULE BY MOUTH EVERY 7 DAYS  . escitalopram (LEXAPRO) 10 MG tablet Take 1 tablet (10 mg total) by mouth at bedtime. (Patient not taking: Reported on 02/01/2020)  . furosemide (LASIX) 20 MG tablet Take 1 tablet (20 mg total) by mouth daily. (Patient not taking: Reported on 02/01/2020)   No facility-administered medications prior to visit.   Depression screen [facility] 2/9 02/01/2020 06/05/2019 06/03/2018 05/31/2017 11/23/2016  Decreased Interest 2 3 3 3 2   Down, Depressed, Hopeless 2 3 3 3 3   PHQ - 2 Score 4 6 6 6 5   Altered sleeping 3 3 3 3 3   Tired, decreased energy 3 3 3 3 3   Change in appetite 2 3 0 2 3  Feeling bad or failure about yourself  3 2 1 2 3   Trouble concentrating 3 0 1 2 1   Moving slowly or fidgety/restless 0 0 0 - 0  Suicidal thoughts 1 0 0 0 0  PHQ-9  Score 19 17 14 18 18   Difficult doing work/chores Very difficult Somewhat difficult Extremely dIfficult Extremely dIfficult -   GAD 7 : Generalized Anxiety Score 02/01/2020  Nervous, Anxious, on Edge 3  Control/stop worrying 3  Worry too much - different things 3  Trouble relaxing 0  Restless 2  Easily annoyed or irritable 2  Afraid - awful might happen 2  Total GAD 7 Score 15  Anxiety Difficulty Very difficult     Review of Systems  Constitutional: Positive for fatigue.  HENT: Negative.   Respiratory: Negative.   Cardiovascular: Negative.   Musculoskeletal: Negative.   Neurological: Negative.   Psychiatric/Behavioral: Negative.     Last CBC Lab Results  Component  Value Date   WBC 5.3 06/12/2019   HGB 13.5 06/12/2019   HCT 39.1 06/12/2019   MCV 88 06/12/2019   MCH 30.2 06/12/2019   RDW 12.4 06/12/2019   PLT 344 06/12/2019   Last thyroid functions Lab Results  Component Value Date   TSH 0.776 11/16/2019   T4TOTAL 10.3 11/16/2019   Last vitamin D Lab Results  Component Value Date   VD25OH 15.5 (L) 06/12/2019   Last vitamin B12 and Folate No results found for: VITAMINB12, FOLATE    Objective    BP (!) 144/79 (BP Location: Left Arm, Patient Position: Sitting, Cuff Size: Large)   Pulse 74   Temp 98.2 F (36.8 C) (Oral)   Resp 16   Wt 208 lb 6.4 oz (94.5 kg)   BMI 42.09 kg/m    Physical Exam Vitals reviewed.  Constitutional:      General: She is not in acute distress.    Appearance: Normal appearance. She is well-developed. She is obese. She is not ill-appearing or diaphoretic.  Neck:     Thyroid: No thyromegaly.     Vascular: No JVD.     Trachea: No tracheal deviation.  Cardiovascular:     Rate and Rhythm: Normal rate and regular rhythm.     Pulses: Normal pulses.     Heart sounds: Normal heart sounds. No murmur heard.  No friction rub. No gallop.   Pulmonary:     Effort: Pulmonary effort is normal. No respiratory distress.     Breath sounds: Normal breath sounds. No wheezing or rales.  Musculoskeletal:     Cervical back: Normal range of motion and neck supple.     Right lower leg: No edema.     Left lower leg: No edema.  Lymphadenopathy:     Cervical: No cervical adenopathy.  Neurological:     General: No focal deficit present.     Mental Status: She is alert.     No results found for any visits on 02/01/20.  Assessment & Plan     1. Fatigue, unspecified type DDx: vit def, anemia, sleep apnea, stess/depression/anxiety. Will check labs as below and f/u pending results. - CBC with Differential/Platelet - VITAMIN D 25 Hydroxy (Vit-D Deficiency, Fractures) - B12 and Folate Panel - Basic Metabolic Panel  (BMET)  I spent approximately 30 minutes with the patient today. Over 50% of this time was spent with counseling and educating the patient.  Return if symptoms worsen or fail to improve.      Reynolds Bowl, PA-C, have reviewed all documentation for this visit. The documentation on 02/06/20 for the exam, diagnosis, procedures, and orders are all accurate and complete.   Rubye Beach  [facility] [phone] (phone) [phone] (fax)  [facility]

## 2020-02-07 ENCOUNTER — Other Ambulatory Visit: Payer: Self-pay | Admitting: Physician Assistant

## 2020-02-07 DIAGNOSIS — Z780 Asymptomatic menopausal state: Secondary | ICD-10-CM

## 2020-04-03 ENCOUNTER — Ambulatory Visit: Payer: Self-pay

## 2020-04-03 NOTE — Telephone Encounter (Signed)
Returned call to patient who was requesting OTC suggestions for sore throat.  She states that her symptoms started yesterday with dry cough,sore throat, and headache. She is fatigued. She has runny nose. She states that she has had her booster for COVID-19 about 2 weeks ago. She has no fever or breathing issues. She is hoarse today. She has not had her flu vaccine. She has no fever just feels bad. Per protocol I was trying to set a virtual appointment with office.  She refused stating that she will go test for COVID and then call back.  Care advice for possible COVID-19 read to patient.  She verbalized understanding.   Reason for Disposition . [1] COVID-19 infection suspected by caller or triager AND [2] mild symptoms (cough, fever, or others) AND [3] negative COVID-19 rapid test . [1] HIGH RISK patient AND [2] influenza is widespread in the community AND [3] ONE OR MORE respiratory symptoms: cough, sore throat, runny or stuffy nose  Answer Assessment - Initial Assessment Questions 1. ONSET: "When did the throat start hurting?" (Hours or days ago)      Yesterday with scratchy throat 2. SEVERITY: "How bad is the sore throat?" (Scale 1-10; mild, moderate or severe)   - MILD (1-3):  doesn't interfere with eating or normal activities   - MODERATE (4-7): interferes with eating some solids and normal activities   - SEVERE (8-10):  excruciating pain, interferes with most normal activities   - SEVERE DYSPHAGIA: can't swallow liquids, drooling    No appitite 3. STREP EXPOSURE: "Has there been any exposure to strep within the past week?" If Yes, ask: "What type of contact occurred?"     No 4.  VIRAL SYMPTOMS: "Are there any symptoms of a cold, such as a runny nose, cough, hoarse voice or red eyes?"     Voice hoarse, dry cough runny nose 5. FEVER: "Do you have a fever?" If Yes, ask: "What is your temperature, how was it measured, and when did it start?"    No 6. PUS ON THE TONSILS: "Is there pus on the  tonsils in the back of your throat?"     N/A 7. OTHER SYMPTOMS: "Do you have any other symptoms?" (e.g., difficulty breathing, headache, rash)    No fatigue headaches 8. PREGNANCY: "Is there any chance you are pregnant?" "When was your last menstrual period?"    N/A  Answer Assessment - Initial Assessment Questions 1. COVID-19 DIAGNOSIS: "Who made your Coronavirus (COVID-19) diagnosis?" "Was it confirmed by a positive lab test?" If not diagnosed by a HCP, ask "Are there lots of cases (community spread) where you live?" (See public health department website, if unsure)     Phillip Heal 2. COVID-19 EXPOSURE: "Was there any known exposure to [place] before the symptoms began?" CDC Definition of close contact: within 6 feet (2 meters) for a total of 15 minutes or more over a 24-hour period.     unsure 3. ONSET: "When did the COVID-19 symptoms start?"   yesterday sore throat cold 4. WORST SYMPTOM: "What is your worst symptom?" (e.g., cough, fever, shortness of breath, muscle aches)    Sore throat 5. COUGH: "Do you have a cough?" If Yes, ask: "How bad is the cough?"       Dry cough 6. FEVER: "Do you have a fever?" If Yes, ask: "What is your temperature, how was it measured, and when did it start?"    No 7. RESPIRATORY STATUS: "Describe your breathing?" (e.g., shortness of breath, wheezing,  unable to speak)     None 8. BETTER-SAME-WORSE: "Are you getting better, staying the same or getting worse compared to yesterday?"  If getting worse, ask, "In what way?"    worse 9. HIGH RISK DISEASE: "Do you have any chronic medical problems?" (e.g., asthma, heart or lung disease, weak immune system, obesity, etc.)    no 10. PREGNANCY: "Is there any chance you are pregnant?" "When was your last menstrual period?"      N/A 11. OTHER SYMPTOMS: "Do you have any other symptoms?"  (e.g., chills, fatigue, headache, loss of smell or taste, muscle pain, sore throat; new loss of smell or taste especially support the  diagnosis of COVID-19)       Fatigue, headache (chronic), sore throat,  Protocols used: CORONAVIRUS (COVID-19) DIAGNOSED OR SUSPECTED-A-AH, SORE THROAT-A-AH

## 2020-04-04 DIAGNOSIS — Z20822 Contact with and (suspected) exposure to covid-19: Secondary | ICD-10-CM | POA: Diagnosis not present

## 2020-04-08 ENCOUNTER — Encounter: Payer: Self-pay | Admitting: Family Medicine

## 2020-04-08 ENCOUNTER — Ambulatory Visit (INDEPENDENT_AMBULATORY_CARE_PROVIDER_SITE_OTHER): Payer: 59 | Admitting: Family Medicine

## 2020-04-08 DIAGNOSIS — J0100 Acute maxillary sinusitis, unspecified: Secondary | ICD-10-CM | POA: Diagnosis not present

## 2020-04-08 DIAGNOSIS — J40 Bronchitis, not specified as acute or chronic: Secondary | ICD-10-CM | POA: Diagnosis not present

## 2020-04-08 MED ORDER — BENZONATATE 200 MG PO CAPS: 200.0000 mg | ORAL_CAPSULE | Freq: Two times a day (BID) | ORAL | 0 refills | Status: DC | PRN

## 2020-04-08 MED ORDER — AMOXICILLIN-POT CLAVULANATE 875-125 MG PO TABS: 1.0000 | ORAL_TABLET | Freq: Two times a day (BID) | ORAL | 0 refills | Status: DC

## 2020-04-08 NOTE — Progress Notes (Signed)
Virtual telephone visit    Virtual Visit via Telephone Note   This visit type was conducted due to national recommendations for restrictions regarding the COVID-19 Pandemic (e.g. social distancing) in an effort to limit this patient's exposure and mitigate transmission in our community. Due to her co-morbid illnesses, this patient is at least at moderate risk for complications without adequate follow up. This format is felt to be most appropriate for this patient at this time. The patient did not have access to video technology or had technical difficulties with video requiring transitioning to audio format only (telephone). Physical exam was limited to content and character of the telephone converstion.    Patient location: home Provider location: office  I discussed the limitations of evaluation and management by telemedicine and the availability of in person appointments. The patient expressed understanding and agreed to proceed.   Visit Date: 04/08/2020  Today's healthcare provider: Vernie Murders, PA   Chief Complaint  Patient presents with  . Cough   Subjective    HPI  Patient is a 64 year old female who presents via phone visit.  She complains of productive cough, head and chest congestion with negative covid test.  She states her symptoms began last week.  She has tried taking NyQuil and Delsym with no relief.   It is of note she has had 3 Covid vaccines.  She denies fever and no shortness of breath or wheezing. Denies loss of taste or smell.   Past Medical History:  Diagnosis Date  . Anxiety   . Depression   . Hyperlipidemia   . Vitamin D deficiency    Past Surgical History:  Procedure Laterality Date  . ABDOMINAL HYSTERECTOMY    . APPENDECTOMY    . BLADDER SURGERY    . CARPAL TUNNEL RELEASE     Both  . CHOLECYSTECTOMY    . KNEE SURGERY Left x's two  . TONSILLECTOMY     Social History   Tobacco Use  . Smoking status: Never Smoker  . Smokeless tobacco:  Never Used  Vaping Use  . Vaping Use: Never used  Substance Use Topics  . Alcohol use: No  . Drug use: No   Family Status  Relation Name Status  . Mother  Alive  . Father  Alive       MI  . Sister 1(yuonger) Alive  . Brother  Alive  . PGM  Deceased at age 41       Coronary artery disease; lymphoma  . Sister 2 Alive   Allergies  Allergen Reactions  . Erythromycin       Medications: Outpatient Medications Prior to Visit  Medication Sig  . Aspirin-Salicylamide-Caffeine (BC HEADACHE POWDER PO) Take 1 Package by mouth as needed.  Marland Kitchen esomeprazole (NEXIUM) 20 MG packet Take 20 mg by mouth daily before breakfast.  . estradiol (ESTRACE) 1 MG tablet TAKE 1 TABLET BY MOUTH ONCE DAILY  . levothyroxine (SYNTHROID) 137 MCG tablet Take 1 tablet (137 mcg total) by mouth daily.  . Vitamin D, Ergocalciferol, (DRISDOL) 1.25 MG (50000 UNIT) CAPS capsule TAKE 1 CAPSULE BY MOUTH EVERY 7 DAYS  . escitalopram (LEXAPRO) 10 MG tablet Take 1 tablet (10 mg total) by mouth at bedtime. (Patient not taking: Reported on 02/01/2020)  . furosemide (LASIX) 20 MG tablet Take 1 tablet (20 mg total) by mouth daily. (Patient not taking: Reported on 02/01/2020)   No facility-administered medications prior to visit.    Review of Systems  Objective    There were no vitals taken for this visit.     Assessment & Plan     1. Subacute maxillary sinusitis Yellow nasal congestion with PND and sore throat over the past week. No fever but some stopped up sensation in ears with sore throat. Mild relief of symptoms and headache with Nyquil/Dayquil. Negative COVID test last week a couple days after onset of symptoms. Will treat with Augmentin for suspected sinusitis with early bronchitis. Recommend maintaining COVID restrictions. - amoxicillin-clavulanate (AUGMENTIN) 875-125 MG tablet; Take 1 tablet by mouth 2 (two) times daily.  Dispense: 20 tablet; Refill: 0  2. Bronchitis Cough with brownish sputum production  and laryngitis over the past few days. Increase fluid intake and use Aleve for headache. May add Tessalon Perles for cough with Augmentin. If loss of taste or fever develops, may need to retest for COVID. Recheck if symptoms no better in 5-6 days. - benzonatate (TESSALON) 200 MG capsule; Take 1 capsule (200 mg total) by mouth 2 (two) times daily as needed for cough.  Dispense: 20 capsule; Refill: 0   No follow-ups on file.    I discussed the assessment and treatment plan with the patient. The patient was provided an opportunity to ask questions and all were answered. The patient agreed with the plan and demonstrated an understanding of the instructions.   The patient was advised to call back or seek an in-person evaluation if the symptoms worsen or if the condition fails to improve as anticipated.  I provided 21 minutes of non-face-to-face time during this encounter.  Andres Shad, PA, have reviewed all documentation for this visit. The documentation on 04/08/20 for the exam, diagnosis, procedures, and orders are all accurate and complete.   Vernie Murders, [place] [phone] (phone) [phone] (fax)  [place]

## 2020-04-29 ENCOUNTER — Other Ambulatory Visit: Payer: Self-pay | Admitting: Physician Assistant

## 2020-04-29 DIAGNOSIS — E559 Vitamin D deficiency, unspecified: Secondary | ICD-10-CM

## 2020-04-29 NOTE — Telephone Encounter (Signed)
Requested medication (s) are due for refill today -patient should have couple weeks left  Requested medication (s) are on the active medication list -yes  Future visit scheduled -no  Last refill: 02/07/20  Notes to clinic: Request non delegated rx  Requested Prescriptions  Pending Prescriptions Disp Refills   Vitamin D, Ergocalciferol, (DRISDOL) 1.25 MG (50000 UNIT) CAPS capsule [Pharmacy Med Name: VITAMIN D (ERGOCALCIFEROL) 1.25 MG] 12 capsule 1    Sig: TAKE 1 CAPSULE BY MOUTH EVERY 7 DAYS      Endocrinology:  Vitamins - Vitamin D Supplementation Failed - 04/29/2020 11:54 AM      Failed - 50,000 IU strengths are not delegated      Failed - Phosphate in normal range and within 360 days    No results found for: PHOS        Failed - Vitamin D in normal range and within 360 days    Vit D, 25-Hydroxy  Date Value Ref Range Status  06/12/2019 15.5 (L) 30.0 - 100.0 ng/mL Final    Comment:    Vitamin D deficiency has been defined by the Institute of Medicine and an Endocrine Society practice guideline as a level of serum 25-OH vitamin D less than 20 ng/mL (1,2). The Endocrine Society went on to further define vitamin D insufficiency as a level between 21 and 29 ng/mL (2). 1. IOM (Institute of Medicine). 2010. Dietary reference    intakes for calcium and D. [place]: The    [employer]. 2. Holick MF, [place], Bischoff-Ferrari HA, et al.    Evaluation, treatment, and prevention of vitamin D    deficiency: an Endocrine Society clinical practice    guideline. JCEM. 2011 Jul; 96(7):1911-30.           Passed - Ca in normal range and within 360 days    Calcium  Date Value Ref Range Status  06/12/2019 9.1 8.7 - 10.3 mg/dL Final          Passed - Valid encounter within last 12 months    Recent Outpatient Visits           3 weeks ago Subacute maxillary sinusitis   [place], [place]   2 months ago Fatigue, unspecified type    [facility], [place], PA-C   10 months ago Annual physical exam   [place], Clearnce Sorrel, [place]   1 year ago Generalized edema   [place], [place], [place]   1 year ago Chronic pain of both knees   [place], [place], [place]                  Requested Prescriptions  Pending Prescriptions Disp Refills   Vitamin D, Ergocalciferol, (DRISDOL) 1.25 MG (50000 UNIT) CAPS capsule [Pharmacy Med Name: VITAMIN D (ERGOCALCIFEROL) 1.25 MG] 12 capsule 1    Sig: TAKE 1 CAPSULE BY MOUTH EVERY 7 DAYS      Endocrinology:  Vitamins - Vitamin D Supplementation Failed - 04/29/2020 11:54 AM      Failed - 50,000 IU strengths are not delegated      Failed - Phosphate in normal range and within 360 days    No results found for: PHOS        Failed - Vitamin D in normal range and within 360 days    Vit D, 25-Hydroxy  Date Value Ref Range Status  06/12/2019 15.5 (L) 30.0 - 100.0  ng/mL Final    Comment:    Vitamin D deficiency has been defined by the [place] practice guideline as a level of serum 25-OH vitamin D less than 20 ng/mL (1,2). The Endocrine Society went on to further define vitamin D insufficiency as a level between 21 and 29 ng/mL (2). 1. IOM (Institute of Medicine). 2010. Dietary reference    intakes for calcium and D. [place]: The    [employer]. 2. Holick MF, Binkley [place], Bischoff-Ferrari HA, et al.    Evaluation, treatment, and prevention of vitamin D    deficiency: an Endocrine Society clinical practice    guideline. JCEM. 2011 Jul; 96(7):1911-30.           Passed - Ca in normal range and within 360 days    Calcium  Date Value Ref Range Status  06/12/2019 9.1 8.7 - 10.3 mg/dL Final          Passed - Valid encounter within last 12 months    Recent Outpatient Visits           3 weeks ago Subacute maxillary  sinusitis   [place], [place]   2 months ago Fatigue, unspecified type   [facility], [place], PA-C   10 months ago Annual physical exam   [place], Clearnce Sorrel, [place]   1 year ago Generalized edema   [place], [place]   1 year ago Chronic pain of both [place], [place], [place]

## 2020-06-12 ENCOUNTER — Encounter: Payer: Self-pay | Admitting: Physician Assistant

## 2020-07-03 ENCOUNTER — Other Ambulatory Visit: Payer: BLUE CROSS/BLUE SHIELD

## 2020-07-08 ENCOUNTER — Telehealth (INDEPENDENT_AMBULATORY_CARE_PROVIDER_SITE_OTHER): Payer: 59 | Admitting: Physician Assistant

## 2020-07-08 DIAGNOSIS — J0140 Acute pansinusitis, unspecified: Secondary | ICD-10-CM | POA: Diagnosis not present

## 2020-07-08 DIAGNOSIS — J0100 Acute maxillary sinusitis, unspecified: Secondary | ICD-10-CM

## 2020-07-08 MED ORDER — AMOXICILLIN-POT CLAVULANATE 875-125 MG PO TABS: 1.0000 | ORAL_TABLET | Freq: Two times a day (BID) | ORAL | 0 refills | Status: DC

## 2020-07-08 NOTE — Progress Notes (Signed)
Virtual telephone visit    Virtual Visit via Telephone Note   This visit type was conducted due to national recommendations for restrictions regarding the COVID-19 Pandemic (e.g. social distancing) in an effort to limit this patient's exposure and mitigate transmission in our community. Due to her co-morbid illnesses, this patient is at least at moderate risk for complications without adequate follow up. This format is felt to be most appropriate for this patient at this time. The patient did not have access to video technology or had technical difficulties with video requiring transitioning to audio format only (telephone). Physical exam was limited to content and character of the telephone converstion.    Patient location: Home Provider location: [facility]  I discussed the limitations of evaluation and management by telemedicine and the availability of in person appointments. The patient expressed understanding and agreed to proceed.   Visit Date: 07/08/2020  Today's healthcare provider: Mar Daring, PA-C   No chief complaint on file.  Subjective    URI  This is a new problem. The current episode started in the past 7 days (started Monday 07/01/20). The problem has been gradually worsening. There has been no fever. Associated symptoms include congestion, coughing, a plugged ear sensation, rhinorrhea and sinus pain. Pertinent negatives include no abdominal pain, chest pain, diarrhea, ear pain, headaches, sneezing, sore throat, swollen glands, vomiting or wheezing. She has tried increased fluids, decongestant and acetaminophen (dayquil with honey and nyquil at bedtime) for the symptoms. The treatment provided mild relief.      Patient Active Problem List   Diagnosis Date Noted  . BMI 40.0-44.9, adult ([place]) 08/29/2018  . Class 3 severe obesity due to excess calories with serious comorbidity and body mass index (BMI) of 40.0 to 44.9 in adult ([place]) 06/03/2018   . History of esophageal stricture 04/05/2015  . Anxiety 04/02/2015  . Solitary cyst of breast 04/02/2015  . Clinical depression 04/02/2015  . Cannot sleep 04/02/2015  . Awareness of heartbeats 04/02/2015  . Post-menopausal 02/19/2015  . Vitamin D deficiency 01/03/2015  . Dermatologic disease 08/27/2009  . Allergic rhinitis 08/01/2009  . H/O total hysterectomy 07/12/2008  . Hypercholesterolemia without hypertriglyceridemia 01/11/2003  . Acid reflux 06/02/2002  . Adult hypothyroidism 04/21/2002   Past Medical History:  Diagnosis Date  . Anxiety   . Depression   . Hyperlipidemia   . Vitamin D deficiency       Medications: Outpatient Medications Prior to Visit  Medication Sig  . amoxicillin-clavulanate (AUGMENTIN) 875-125 MG tablet Take 1 tablet by mouth 2 (two) times daily.  . Aspirin-Salicylamide-Caffeine (BC HEADACHE POWDER PO) Take 1 Package by mouth as needed.  . benzonatate (TESSALON) 200 MG capsule Take 1 capsule (200 mg total) by mouth 2 (two) times daily as needed for cough.  . escitalopram (LEXAPRO) 10 MG tablet Take 1 tablet (10 mg total) by mouth at bedtime. (Patient not taking: Reported on 02/01/2020)  . esomeprazole (NEXIUM) 20 MG packet Take 20 mg by mouth daily before breakfast.  . estradiol (ESTRACE) 1 MG tablet TAKE 1 TABLET BY MOUTH ONCE DAILY  . furosemide (LASIX) 20 MG tablet Take 1 tablet (20 mg total) by mouth daily. (Patient not taking: Reported on 02/01/2020)  . levothyroxine (SYNTHROID) 137 MCG tablet Take 1 tablet (137 mcg total) by mouth daily.  . Vitamin D, Ergocalciferol, (DRISDOL) 1.25 MG (50000 UNIT) CAPS capsule TAKE 1 CAPSULE BY MOUTH EVERY 7 DAYS   No facility-administered medications prior to visit.  Review of Systems  HENT: Positive for congestion, rhinorrhea and sinus pain. Negative for ear pain, sneezing and sore throat.   Respiratory: Positive for cough. Negative for wheezing.   Cardiovascular: Negative for chest pain.   Gastrointestinal: Negative for abdominal pain, diarrhea and vomiting.  Neurological: Negative for headaches.    Last CBC Lab Results  Component Value Date   WBC 5.3 06/12/2019   HGB 13.5 06/12/2019   HCT 39.1 06/12/2019   MCV 88 06/12/2019   MCH 30.2 06/12/2019   RDW 12.4 06/12/2019   PLT 344 12/75/1700   Last metabolic panel Lab Results  Component Value Date   GLUCOSE 81 06/12/2019   NA 139 06/12/2019   K 4.1 06/12/2019   CL 106 06/12/2019   CO2 21 06/12/2019   BUN 12 06/12/2019   CREATININE 0.65 06/12/2019   GFRNONAA 94 06/12/2019   GFRAA 109 06/12/2019   CALCIUM 9.1 06/12/2019   PROT 6.5 06/12/2019   ALBUMIN 4.1 06/12/2019   LABGLOB 2.4 06/12/2019   AGRATIO 1.7 06/12/2019   BILITOT 0.3 06/12/2019   ALKPHOS 100 06/12/2019   AST 15 06/12/2019   ALT 8 06/12/2019      Objective    There were no vitals taken for this visit. BP Readings from Last 3 Encounters:  02/01/20 (!) 144/79  06/05/19 136/67  10/10/18 120/72   Wt Readings from Last 3 Encounters:  02/01/20 208 lb 6.4 oz (94.5 kg)  06/05/19 204 lb (92.5 kg)  10/10/18 209 lb 9.6 oz (95.1 kg)        Assessment & Plan     1. Acute non-recurrent pansinusitis Worsening symptoms that have not responded to OTC medications. Will give augmentin as below. Continue allergy medications. Stay well hydrated and get plenty of rest. Call if no symptom improvement or if symptoms worsen. - amoxicillin-clavulanate (AUGMENTIN) 875-125 MG tablet; Take 1 tablet by mouth 2 (two) times daily.  Dispense: 20 tablet; Refill: 0   No follow-ups on file.    I discussed the assessment and treatment plan with the patient. The patient was provided an opportunity to ask questions and all were answered. The patient agreed with the plan and demonstrated an understanding of the instructions.   The patient was advised to call back or seek an in-person evaluation if the symptoms worsen or if the condition fails to improve as  anticipated.  I provided 17 minutes of non-face-to-face time during this encounter.  Reynolds Bowl, PA-C, have reviewed all documentation for this visit. The documentation on 07/09/20 for the exam, diagnosis, procedures, and orders are all accurate and complete.  [place] [facility] [phone] (phone) [phone] (fax)  [place]

## 2020-07-09 ENCOUNTER — Encounter: Payer: Self-pay | Admitting: Physician Assistant

## 2020-07-10 ENCOUNTER — Encounter: Payer: Self-pay | Admitting: Physician Assistant

## 2020-07-19 ENCOUNTER — Other Ambulatory Visit: Payer: Self-pay | Admitting: Physician Assistant

## 2020-07-19 DIAGNOSIS — E039 Hypothyroidism, unspecified: Secondary | ICD-10-CM

## 2020-07-22 ENCOUNTER — Other Ambulatory Visit: Payer: Self-pay

## 2020-07-22 ENCOUNTER — Ambulatory Visit (INDEPENDENT_AMBULATORY_CARE_PROVIDER_SITE_OTHER): Payer: 59 | Admitting: Physician Assistant

## 2020-07-22 ENCOUNTER — Encounter: Payer: Self-pay | Admitting: Physician Assistant

## 2020-07-22 VITALS — BP 140/84 | HR 76 | Temp 98.9°F | Resp 16 | Ht 59.0 in | Wt 206.3 lb

## 2020-07-22 DIAGNOSIS — E66813 Obesity, class 3: Secondary | ICD-10-CM

## 2020-07-22 DIAGNOSIS — F331 Major depressive disorder, recurrent, moderate: Secondary | ICD-10-CM | POA: Insufficient documentation

## 2020-07-22 DIAGNOSIS — Z0000 Encounter for general adult medical examination without abnormal findings: Secondary | ICD-10-CM | POA: Diagnosis not present

## 2020-07-22 DIAGNOSIS — Z23 Encounter for immunization: Secondary | ICD-10-CM | POA: Diagnosis not present

## 2020-07-22 DIAGNOSIS — Z1239 Encounter for other screening for malignant neoplasm of breast: Secondary | ICD-10-CM | POA: Diagnosis not present

## 2020-07-22 DIAGNOSIS — E538 Deficiency of other specified B group vitamins: Secondary | ICD-10-CM | POA: Diagnosis not present

## 2020-07-22 DIAGNOSIS — M8588 Other specified disorders of bone density and structure, other site: Secondary | ICD-10-CM | POA: Diagnosis not present

## 2020-07-22 DIAGNOSIS — E559 Vitamin D deficiency, unspecified: Secondary | ICD-10-CM | POA: Diagnosis not present

## 2020-07-22 DIAGNOSIS — Z6841 Body Mass Index (BMI) 40.0 and over, adult: Secondary | ICD-10-CM

## 2020-07-22 DIAGNOSIS — E039 Hypothyroidism, unspecified: Secondary | ICD-10-CM

## 2020-07-22 DIAGNOSIS — E7800 Pure hypercholesterolemia, unspecified: Secondary | ICD-10-CM

## 2020-07-22 DIAGNOSIS — R69 Illness, unspecified: Secondary | ICD-10-CM | POA: Diagnosis not present

## 2020-07-22 NOTE — Patient Instructions (Addendum)
[facility] at [place],  [place]  [address] [address]: [phone]  Preventive Care 66 Years and Older, Female Preventive care refers to lifestyle choices and visits with your health care provider that can promote health and wellness. This includes:  A yearly physical exam. This is also called an annual wellness visit.  Regular dental and eye exams.  Immunizations.  Screening for certain conditions.  Healthy lifestyle choices, such as: ? Eating a healthy diet. ? Getting regular exercise. ? Not using drugs or products that contain nicotine and tobacco. ? Limiting alcohol use. What can I expect for my preventive care visit? Physical exam Your health care provider will check your:  Height and weight. These may be used to calculate your BMI (body mass index). BMI is a measurement that tells if you are at a healthy weight.  Heart rate and blood pressure.  Body temperature.  Skin for abnormal spots. Counseling Your health care provider may ask you questions about your:  Past medical problems.  Family's medical history.  Alcohol, tobacco, and drug use.  Emotional well-being.  Home life and relationship well-being.  Sexual activity.  Diet, exercise, and sleep habits.  History of falls.  Memory and ability to understand (cognition).  Work and work Statistician.  Pregnancy and menstrual history.  Access to firearms. What immunizations do I need? Vaccines are usually given at various ages, according to a schedule. Your health care provider will recommend vaccines for you based on your age, medical history, and lifestyle or other factors, such as travel or where you work.   What tests do I need? Blood tests  Lipid and cholesterol levels. These may be checked every 5 years, or more often depending on your overall health.  Hepatitis C test.  Hepatitis B test. Screening  Lung cancer screening. You may have this  screening every year starting at age 45 if you have a 30-pack-year history of smoking and currently smoke or have quit within the past 15 years.  Colorectal cancer screening. ? All adults should have this screening starting at age 10 and continuing until age 60. ? Your health care provider may recommend screening at age 31 if you are at increased risk. ? You will have tests every 1-10 years, depending on your results and the type of screening test.  Diabetes screening. ? This is done by checking your blood sugar (glucose) after you have not eaten for a while (fasting). ? You may have this done every 1-3 years.  Mammogram. ? This may be done every 1-2 years. ? Talk with your health care provider about how often you should have regular mammograms.  Abdominal aortic aneurysm (AAA) screening. You may need this if you are a current or former smoker.  BRCA-related cancer screening. This may be done if you have a family history of breast, ovarian, tubal, or peritoneal cancers. Other tests  STD (sexually transmitted disease) testing, if you are at risk.  Bone density scan. This is done to screen for osteoporosis. You may have this done starting at age 42. Talk with your health care provider about your test results, treatment options, and if necessary, the need for more tests. Follow these instructions at home: Eating and drinking  Eat a diet that includes fresh fruits and vegetables, whole grains, lean protein, and low-fat dairy products. Limit your intake of foods with high amounts of sugar, saturated fats, and salt.  Take vitamin and mineral supplements as recommended by your  health care provider.  Do not drink alcohol if your health care provider tells you not to drink.  If you drink alcohol: ? Limit how much you have to 0-1 drink a day. ? Be aware of how much alcohol is in your drink. In the [place] one drink equals one 12 oz bottle of beer (355 mL), one 5 oz glass of wine (148 mL), or  one 1 oz glass of hard liquor (44 mL).   Lifestyle  Take daily care of your teeth and gums. Brush your teeth every morning and night with fluoride toothpaste. Floss one time each day.  Stay active. Exercise for at least 30 minutes 5 or more days each week.  Do not use any products that contain nicotine or tobacco, such as cigarettes, e-cigarettes, and chewing tobacco. If you need help quitting, ask your health care provider.  Do not use drugs.  If you are sexually active, practice safe sex. Use a condom or other form of protection in order to prevent STIs (sexually transmitted infections).  Talk with your health care provider about taking a low-dose aspirin or statin.  Find healthy ways to cope with stress, such as: ? Meditation, yoga, or listening to music. ? Journaling. ? Talking to a trusted person. ? Spending time with friends and family. Safety  Always wear your seat belt while driving or riding in a vehicle.  Do not drive: ? If you have been drinking alcohol. Do not ride with someone who has been drinking. ? When you are tired or distracted. ? While texting.  Wear a helmet and other protective equipment during sports activities.  If you have firearms in your house, make sure you follow all gun safety procedures. What's next?  Visit your health care provider once a year for an annual wellness visit.  Ask your health care provider how often you should have your eyes and teeth checked.  Stay up to date on all vaccines. This information is not intended to replace advice given to you by your health care provider. Make sure you discuss any questions you have with your health care provider. Document Revised: 05/22/2020 Document Reviewed: 05/26/2018 Elsevier Patient Education  2021 [employer].

## 2020-07-22 NOTE — Progress Notes (Signed)
Complete physical exam   Patient: Tammy Bailey   DOB: 05-21-1956   65 y.o. Female  MRN: [id number] Visit Date: 07/22/2020  Today's healthcare provider: Mar Daring, PA-C   Chief Complaint  Patient presents with  . Annual Exam   Subjective    Tammy Bailey is a 65 y.o. female who presents today for a complete physical exam.  She reports consuming a general diet. The patient does not participate in regular exercise at present. She generally feels well. She reports sleeping well. She does not have additional problems to discuss today.  HPI    Past Medical History:  Diagnosis Date  . Anxiety   . Depression   . Hyperlipidemia   . Vitamin D deficiency    Past Surgical History:  Procedure Laterality Date  . ABDOMINAL HYSTERECTOMY    . APPENDECTOMY    . BLADDER SURGERY    . CARPAL TUNNEL RELEASE     Both  . CHOLECYSTECTOMY    . KNEE SURGERY Left x's two  . TONSILLECTOMY     Social History   Socioeconomic History  . Marital status: Divorced    Spouse name: Not on file  . Number of children: Not on file  . Years of education: Not on file  . Highest education level: Not on file  Occupational History  . Not on file  Tobacco Use  . Smoking status: Never Smoker  . Smokeless tobacco: Never Used  Vaping Use  . Vaping Use: Never used  Substance and Sexual Activity  . Alcohol use: No  . Drug use: No  . Sexual activity: Not on file  Other Topics Concern  . Not on file  Social History Narrative  . Not on file   Social Determinants of Health   Financial Resource Strain: Not on file  Food Insecurity: Not on file  Transportation Needs: Not on file  Physical Activity: Not on file  Stress: Not on file  Social Connections: Not on file  Intimate Partner Violence: Not on file   Family Status  Relation Name Status  . Mother  Alive  . Father  Alive       MI  . Sister 1(yuonger) Alive  . Brother  Alive  . PGM  Deceased at age 39       Coronary artery  disease; lymphoma  . Sister 2 Alive   Family History  Problem Relation Age of Onset  . Hypertension Father   . Diabetes Sister    Allergies  Allergen Reactions  . Erythromycin     Patient Care Team: Mar Daring, PA-C as PCP - General (Family Medicine)   Medications: Outpatient Medications Prior to Visit  Medication Sig  . amoxicillin-clavulanate (AUGMENTIN) 875-125 MG tablet Take 1 tablet by mouth 2 (two) times daily.  Marland Kitchen escitalopram (LEXAPRO) 10 MG tablet Take 1 tablet (10 mg total) by mouth at bedtime. (Patient taking differently: Take 10 mg by mouth as needed.)  . esomeprazole (NEXIUM) 20 MG packet Take 20 mg by mouth as needed.  Marland Kitchen estradiol (ESTRACE) 1 MG tablet TAKE 1 TABLET BY MOUTH ONCE DAILY  . levothyroxine (SYNTHROID) 137 MCG tablet TAKE 1 TABLET BY MOUTH ONCE DAILY ON AN EMPTY STOMACH. WAIT 30 MINUTES BEFORE TAKING OTHER MEDS.  . Vitamin D, Ergocalciferol, (DRISDOL) 1.25 MG (50000 UNIT) CAPS capsule TAKE 1 CAPSULE BY MOUTH EVERY 7 DAYS  . [DISCONTINUED] Aspirin-Salicylamide-Caffeine (BC HEADACHE POWDER PO) Take 1 Package by mouth as needed. (Patient  not taking: Reported on 07/22/2020)  . [DISCONTINUED] benzonatate (TESSALON) 200 MG capsule Take 1 capsule (200 mg total) by mouth 2 (two) times daily as needed for cough.  . [DISCONTINUED] furosemide (LASIX) 20 MG tablet Take 1 tablet (20 mg total) by mouth daily. (Patient not taking: Reported on 02/01/2020)   No facility-administered medications prior to visit.    Review of Systems  Constitutional: Positive for diaphoresis and fatigue.  HENT: Negative.   Eyes: Negative.   Respiratory: Negative.   Cardiovascular: Positive for leg swelling.  Gastrointestinal: Negative.   Endocrine: Negative.   Genitourinary: Negative.   Musculoskeletal: Positive for arthralgias.  Skin: Negative.   Allergic/Immunologic: Negative.   Neurological: Positive for light-headedness and headaches.  Hematological: Negative.    Psychiatric/Behavioral: The patient is nervous/anxious.     Last CBC Lab Results  Component Value Date   WBC 5.3 06/12/2019   HGB 13.5 06/12/2019   HCT 39.1 06/12/2019   MCV 88 06/12/2019   MCH 30.2 06/12/2019   RDW 12.4 06/12/2019   PLT 344 08/65/7846   Last metabolic panel Lab Results  Component Value Date   GLUCOSE 81 06/12/2019   NA 139 06/12/2019   K 4.1 06/12/2019   CL 106 06/12/2019   CO2 21 06/12/2019   BUN 12 06/12/2019   CREATININE 0.65 06/12/2019   GFRNONAA 94 06/12/2019   GFRAA 109 06/12/2019   CALCIUM 9.1 06/12/2019   PROT 6.5 06/12/2019   ALBUMIN 4.1 06/12/2019   LABGLOB 2.4 06/12/2019   AGRATIO 1.7 06/12/2019   BILITOT 0.3 06/12/2019   ALKPHOS 100 06/12/2019   AST 15 06/12/2019   ALT 8 06/12/2019   Last lipids Lab Results  Component Value Date   CHOL 192 06/12/2019   HDL 67 06/12/2019   LDLCALC 108 (H) 06/12/2019   TRIG 94 06/12/2019   CHOLHDL 2.9 06/12/2019   Last hemoglobin A1c Lab Results  Component Value Date   HGBA1C 5.1 06/12/2019   Last thyroid functions Lab Results  Component Value Date   TSH 0.776 11/16/2019   T4TOTAL 10.3 11/16/2019      Objective    BP 140/84 (BP Location: Left Arm, Patient Position: Sitting, Cuff Size: Large)   Pulse 76   Temp 98.9 F (37.2 C) (Oral)   Resp 16   Ht 4\' 11"  (1.499 m)   Wt 206 lb 4.8 oz (93.6 kg)   BMI 41.67 kg/m  BP Readings from Last 3 Encounters:  07/22/20 140/84  02/01/20 (!) 144/79  06/05/19 136/67   Wt Readings from Last 3 Encounters:  07/22/20 206 lb 4.8 oz (93.6 kg)  02/01/20 208 lb 6.4 oz (94.5 kg)  06/05/19 204 lb (92.5 kg)      Physical Exam Vitals reviewed.  Constitutional:      General: She is not in acute distress.    Appearance: Normal appearance. She is well-developed and well-nourished. She is obese. She is not ill-appearing or diaphoretic.  HENT:     Head: Normocephalic and atraumatic.     Right Ear: Tympanic membrane, ear canal and external ear normal.      Left Ear: Tympanic membrane, ear canal and external ear normal.     Mouth/Throat:     Mouth: Oropharynx is clear and moist.  Eyes:     General: No scleral icterus.       Right eye: No discharge.        Left eye: No discharge.     Extraocular Movements: Extraocular movements intact and EOM normal.  Conjunctiva/sclera: Conjunctivae normal.     Pupils: Pupils are equal, round, and reactive to light.  Neck:     Thyroid: No thyromegaly.     Vascular: No carotid bruit or JVD.     Trachea: No tracheal deviation.  Cardiovascular:     Rate and Rhythm: Normal rate and regular rhythm.     Pulses: Normal pulses and intact distal pulses.     Heart sounds: Normal heart sounds. No murmur heard. No friction rub. No gallop.   Pulmonary:     Effort: Pulmonary effort is normal. No respiratory distress.     Breath sounds: Normal breath sounds. No wheezing or rales.  Chest:     Chest wall: No tenderness.  Abdominal:     General: Abdomen is flat. Bowel sounds are normal. There is no distension.     Palpations: Abdomen is soft. There is no mass.     Tenderness: There is no abdominal tenderness. There is no guarding or rebound.  Musculoskeletal:        General: No tenderness or edema. Normal range of motion.     Cervical back: Normal range of motion and neck supple. No tenderness.     Right lower leg: No edema.     Left lower leg: No edema.  Lymphadenopathy:     Cervical: No cervical adenopathy.  Skin:    General: Skin is warm and dry.     Capillary Refill: Capillary refill takes less than 2 seconds.     Findings: No rash.  Neurological:     General: No focal deficit present.     Mental Status: She is alert and oriented to person, place, and time. Mental status is at baseline.     Gait: Gait abnormal (antalgic; left foot pain).  Psychiatric:        Mood and Affect: Mood and affect and mood normal.        Behavior: Behavior normal.        Thought Content: Thought content normal.         Judgment: Judgment normal.     Last depression screening scores PHQ 2/9 Scores 02/01/2020 06/05/2019 06/03/2018  PHQ - 2 Score 4 6 6   PHQ- 9 Score 19 17 14    Last fall risk screening Fall Risk  06/05/2019  Falls in the past year? 1  Number falls in past yr: 1  Injury with Fall? 1  Comment injured her knee  Follow up Falls evaluation completed   Last Audit-C alcohol use screening Alcohol Use Disorder Test (AUDIT) 07/22/2020  1. How often do you have a drink containing alcohol? 0  2. How many drinks containing alcohol do you have on a typical day when you are drinking? 0  3. How often do you have six or more drinks on one occasion? 0  AUDIT-C Score 0  Alcohol Brief Interventions/Follow-up AUDIT Score <7 follow-up not indicated   A score of 3 or more in women, and 4 or more in men indicates increased risk for alcohol abuse, EXCEPT if all of the points are from question 1   No results found for any visits on 07/22/20.  Assessment & Plan    Routine Health Maintenance and Physical Exam  Exercise Activities and Dietary recommendations Goals   None     Immunization History  Administered Date(s) Administered  . Influenza Inj Mdck Quad With Preservative 04/11/2018  . Influenza Split 08/27/2009  . Influenza,inj,Quad PF,6+ Mos 03/30/2014, 04/05/2015, 04/07/2016, 05/31/2017  . Influenza-Unspecified 04/06/2019  .  PFIZER(Purple Top)SARS-COV-2 Vaccination 08/18/2019, 09/08/2019  . Td 04/03/2004  . Tdap 08/27/2009  . Zoster 05/19/2013    Health Maintenance  Topic Date Due  . MAMMOGRAM  04/11/2015  . TETANUS/TDAP  08/28/2019  . DEXA SCAN  Never done  . PNA vac Low Risk Adult (1 of 2 - PCV13) Never done  . COVID-19 Vaccine (3 - Pfizer risk 4-dose series) 10/06/2019  . INFLUENZA VACCINE  01/14/2020  . Fecal DNA (Cologuard)  08/03/2021  . Hepatitis C Screening  Completed  . HIV Screening  Completed  . PAP SMEAR-Modifier  Discontinued    Discussed health benefits of physical  activity, and encouraged her to engage in regular exercise appropriate for her age and condition.  1. Annual physical exam Normal physical exam today. Will check labs as below and f/u pending lab results. If labs are stable and WNL she will not need to have these rechecked for one year at her next annual physical exam. She is to call the office in the meantime if she has any acute issue, questions or concerns. - CBC with Differential/Platelet - Comprehensive metabolic panel - Hemoglobin A1c - Lipid Panel With LDL/HDL Ratio - TSH  2. Encounter for breast cancer screening using non-mammogram modality There is no family history of breast cancer. She does perform regular self breast exams. Mammogram was ordered as below. Information for [facility] was given to patient so she may schedule her mammogram at her convenience. - MM 3D SCREEN BREAST BILATERAL  3. Adult hypothyroidism Stable Continue levothyroxine 116mcg. Will check labs as below and f/u pending results. - TSH  4. Vitamin D deficiency H/O this. Postmenopausal. Continue high dose supplementation. Will check labs as below and f/u pending results. Bone density also due. Ordered as below.  - CBC with Differential/Platelet - VITAMIN D 25 Hydroxy (Vit-D Deficiency, Fractures) - DG Bone Density; Future  5. Hypercholesterolemia without hypertriglyceridemia Diet controlled. Will check labs as below and f/u pending results. - Comprehensive metabolic panel - Hemoglobin A1c - Lipid Panel With LDL/HDL Ratio  6. Class 3 severe obesity due to excess calories with serious comorbidity and body mass index (BMI) of 40.0 to 44.9 in adult [facility]) Counseled patient on healthy lifestyle modifications including dieting and exercise.  Will check labs as below and f/u pending results. - Comprehensive metabolic panel - Hemoglobin A1c - Lipid Panel With LDL/HDL Ratio  7. Moderate episode of recurrent major depressive disorder (HCC) Stable.  Continue Lexapro 10mg .   8. B12 deficiency H/O this. On chronic PPI. Will check labs as below and f/u pending results. - Vitamin B12  9. Osteopenia of lumbar spine H/O this in coccyx. Bone density ordered for osteoporosis screen. - DG Bone Density; Future  10. Need for 23-polyvalent pneumococcal polysaccharide vaccine Pneumococcal 23 Vaccine given to patient without complications. Patient sat for 15 minutes after administration and was tolerated well without adverse effects. - Pneumococcal polysaccharide vaccine 23-valent greater than or equal to 2yo subcutaneous/IM  11. Need for influenza vaccination Flu vaccine given today without complication. Patient sat upright for 15 minutes to check for adverse reaction before being released. - Flu Vaccine QUAD High Dose(Fluad)   No follow-ups on file.     Reynolds Bowl, PA-C, have reviewed all documentation for this visit. The documentation on 07/22/20 for the exam, diagnosis, procedures, and orders are all accurate and complete.   Rubye Beach  [facility] [phone] (phone) [phone] (fax)  [place]

## 2020-07-23 DIAGNOSIS — E039 Hypothyroidism, unspecified: Secondary | ICD-10-CM | POA: Diagnosis not present

## 2020-07-23 DIAGNOSIS — E538 Deficiency of other specified B group vitamins: Secondary | ICD-10-CM | POA: Diagnosis not present

## 2020-07-23 DIAGNOSIS — Z0000 Encounter for general adult medical examination without abnormal findings: Secondary | ICD-10-CM | POA: Diagnosis not present

## 2020-07-23 DIAGNOSIS — E559 Vitamin D deficiency, unspecified: Secondary | ICD-10-CM | POA: Diagnosis not present

## 2020-07-23 DIAGNOSIS — Z6841 Body Mass Index (BMI) 40.0 and over, adult: Secondary | ICD-10-CM | POA: Diagnosis not present

## 2020-07-23 DIAGNOSIS — E7800 Pure hypercholesterolemia, unspecified: Secondary | ICD-10-CM | POA: Diagnosis not present

## 2020-07-24 LAB — COMPREHENSIVE METABOLIC PANEL
ALT: 16 IU/L (ref 0–32)
AST: 18 IU/L (ref 0–40)
Albumin/Globulin Ratio: 1.6 (ref 1.2–2.2)
Albumin: 4.4 g/dL (ref 3.8–4.8)
Alkaline Phosphatase: 112 IU/L (ref 44–121)
BUN/Creatinine Ratio: 19 (ref 12–28)
BUN: 14 mg/dL (ref 8–27)
Bilirubin Total: 0.4 mg/dL (ref 0.0–1.2)
CO2: 19 mmol/L — ABNORMAL LOW (ref 20–29)
Calcium: 9.3 mg/dL (ref 8.7–10.3)
Chloride: 104 mmol/L (ref 96–106)
Creatinine, Ser: 0.74 mg/dL (ref 0.57–1.00)
GFR calc Af Amer: 98 mL/min/{1.73_m2} (ref >59)
GFR calc non Af Amer: 85 mL/min/{1.73_m2} (ref >59)
Globulin, Total: 2.7 g/dL (ref 1.5–4.5)
Glucose: 91 mg/dL (ref 65–99)
Potassium: 5 mmol/L (ref 3.5–5.2)
Sodium: 142 mmol/L (ref 134–144)
Total Protein: 7.1 g/dL (ref 6.0–8.5)

## 2020-07-24 LAB — CBC WITH DIFFERENTIAL/PLATELET
Basophils Absolute: 0.1 10*3/uL (ref 0.0–0.2)
Basos: 1 %
EOS (ABSOLUTE): 0.2 10*3/uL (ref 0.0–0.4)
Eos: 3 %
Hematocrit: 41 % (ref 34.0–46.6)
Hemoglobin: 14 g/dL (ref 11.1–15.9)
Immature Grans (Abs): 0 10*3/uL (ref 0.0–0.1)
Immature Granulocytes: 0 %
Lymphocytes Absolute: 1.6 10*3/uL (ref 0.7–3.1)
Lymphs: 27 %
MCH: 30 pg (ref 26.6–33.0)
MCHC: 34.1 g/dL (ref 31.5–35.7)
MCV: 88 fL (ref 79–97)
Monocytes Absolute: 0.6 10*3/uL (ref 0.1–0.9)
Monocytes: 10 %
Neutrophils Absolute: 3.4 10*3/uL (ref 1.4–7.0)
Neutrophils: 59 %
Platelets: 400 10*3/uL (ref 150–450)
RBC: 4.67 x10E6/uL (ref 3.77–5.28)
RDW: 11.8 % (ref 11.7–15.4)
WBC: 5.8 10*3/uL (ref 3.4–10.8)

## 2020-07-24 LAB — VITAMIN B12: Vitamin B-12: 1402 pg/mL — ABNORMAL HIGH (ref 232–1245)

## 2020-07-24 LAB — HEMOGLOBIN A1C
Est. average glucose Bld gHb Est-mCnc: 108 mg/dL
Hgb A1c MFr Bld: 5.4 % (ref 4.8–5.6)

## 2020-07-24 LAB — LIPID PANEL WITH LDL/HDL RATIO
Cholesterol, Total: 215 mg/dL — ABNORMAL HIGH (ref 100–199)
HDL: 65 mg/dL (ref >39)
LDL Chol Calc (NIH): 137 mg/dL — ABNORMAL HIGH (ref 0–99)
LDL/HDL Ratio: 2.1 ratio (ref 0.0–3.2)
Triglycerides: 76 mg/dL (ref 0–149)
VLDL Cholesterol Cal: 13 mg/dL (ref 5–40)

## 2020-07-24 LAB — VITAMIN D 25 HYDROXY (VIT D DEFICIENCY, FRACTURES): Vit D, 25-Hydroxy: 48.4 ng/mL (ref 30.0–100.0)

## 2020-07-24 LAB — TSH: TSH: 10.9 u[IU]/mL — ABNORMAL HIGH (ref 0.450–4.500)

## 2020-07-26 ENCOUNTER — Telehealth: Payer: Self-pay | Admitting: Physician Assistant

## 2020-07-26 MED ORDER — LEVOTHYROXINE SODIUM 150 MCG PO TABS: 150.0000 ug | ORAL_TABLET | Freq: Every day | ORAL | 1 refills | Status: DC

## 2020-07-26 NOTE — Telephone Encounter (Signed)
Pt given lab results per notes of Jennifer,PA on 07/26/20. Pt verbalized understanding. Patient states that she has been taking 137 mcg of Levothyroxine daily. Patient would like for new prescription with increased dosage to be sent to [facility]. Unable to document this information in the result note.

## 2020-07-26 NOTE — Addendum Note (Signed)
Addended by: Ashley Royalty E on: 07/26/2020 04:58 PM   Modules accepted: Orders

## 2020-07-26 NOTE — Telephone Encounter (Signed)
Levothyroxine 156mcg was sent to [facility].   Thanks,   -Mickel Baas

## 2020-07-26 NOTE — Telephone Encounter (Signed)
Pt is calling back for her lab results. Please advise CB- [phone]

## 2020-07-26 NOTE — Telephone Encounter (Signed)
Attempted to return call to patient, no answer. Left message on voicemail to return call for lab results.

## 2020-07-26 NOTE — Telephone Encounter (Signed)
Pt returned call for lab results.

## 2020-08-29 ENCOUNTER — Other Ambulatory Visit: Payer: Self-pay | Admitting: Physician Assistant

## 2020-08-29 DIAGNOSIS — Z780 Asymptomatic menopausal state: Secondary | ICD-10-CM

## 2020-09-10 ENCOUNTER — Ambulatory Visit: Payer: 59 | Attending: Physician Assistant

## 2020-09-10 ENCOUNTER — Other Ambulatory Visit: Payer: Self-pay

## 2020-09-10 ENCOUNTER — Ambulatory Visit: Payer: Self-pay

## 2020-09-28 ENCOUNTER — Other Ambulatory Visit: Payer: Self-pay | Admitting: Physician Assistant

## 2020-09-28 DIAGNOSIS — Z780 Asymptomatic menopausal state: Secondary | ICD-10-CM

## 2020-09-28 NOTE — Telephone Encounter (Signed)
Requested medication (s) are due for refill today: yes  Requested medication (s) are on the active medication list: yes  Last refill:  02/07/20 #90 1 Rf  Future visit scheduled: no   Notes to clinic:  overdue for mammogram   Requested Prescriptions  Pending Prescriptions Disp Refills   estradiol (ESTRACE) 1 MG tablet [Pharmacy Med Name: ESTRADIOL 1 MG TAB] 90 tablet 1    Sig: TAKE 1 TABLET BY MOUTH ONCE DAILY      OB/GYN:  Estrogens Failed - 09/28/2020  1:47 PM      Failed - Mammogram is up-to-date per Health Maintenance      Failed - Last BP in normal range    BP Readings from Last 1 Encounters:  07/22/20 140/84          Passed - Valid encounter within last 12 months    Recent Outpatient Visits           2 months ago Annual physical exam   [place], [place]   2 months ago Acute non-recurrent pansinusitis   [place], [place]   5 months ago Subacute maxillary sinusitis   [place], [place]-C   8 months ago Fatigue, unspecified type   [facility], [place], [place]   1 year ago Annual physical exam   [facility] [place], [place], [place]

## 2020-11-19 ENCOUNTER — Telehealth: Payer: Self-pay | Admitting: Physician Assistant

## 2020-11-19 NOTE — Telephone Encounter (Signed)
Former patient of Dr. Marlyn Corporal called to schedule a tetanus vaccine which Dr. Marlyn Corporal said she was due to get before she left.  Please advise and call patient to discuss at [phone]

## 2020-11-20 ENCOUNTER — Ambulatory Visit: Payer: Self-pay

## 2020-11-20 ENCOUNTER — Telehealth: Payer: Self-pay

## 2020-11-20 NOTE — Telephone Encounter (Signed)
Noted  

## 2021-01-30 DIAGNOSIS — D3132 Benign neoplasm of left choroid: Secondary | ICD-10-CM | POA: Diagnosis not present

## 2021-02-21 ENCOUNTER — Ambulatory Visit: Payer: 59 | Admitting: Family Medicine

## 2021-02-21 ENCOUNTER — Encounter: Payer: Self-pay | Admitting: Family Medicine

## 2021-02-21 ENCOUNTER — Other Ambulatory Visit: Payer: Self-pay

## 2021-02-21 VITALS — BP 150/73 | HR 66 | Temp 98.0°F | Resp 16 | Wt 209.1 lb

## 2021-02-21 DIAGNOSIS — I10 Essential (primary) hypertension: Secondary | ICD-10-CM | POA: Diagnosis not present

## 2021-02-21 DIAGNOSIS — E039 Hypothyroidism, unspecified: Secondary | ICD-10-CM

## 2021-02-21 DIAGNOSIS — G8929 Other chronic pain: Secondary | ICD-10-CM | POA: Insufficient documentation

## 2021-02-21 DIAGNOSIS — R5383 Other fatigue: Secondary | ICD-10-CM | POA: Diagnosis not present

## 2021-02-21 DIAGNOSIS — L300 Nummular dermatitis: Secondary | ICD-10-CM | POA: Insufficient documentation

## 2021-02-21 DIAGNOSIS — M898X1 Other specified disorders of bone, shoulder: Secondary | ICD-10-CM | POA: Diagnosis not present

## 2021-02-21 DIAGNOSIS — F331 Major depressive disorder, recurrent, moderate: Secondary | ICD-10-CM | POA: Diagnosis not present

## 2021-02-21 DIAGNOSIS — G44221 Chronic tension-type headache, intractable: Secondary | ICD-10-CM | POA: Insufficient documentation

## 2021-02-21 DIAGNOSIS — R69 Illness, unspecified: Secondary | ICD-10-CM | POA: Diagnosis not present

## 2021-02-21 DIAGNOSIS — M67922 Unspecified disorder of synovium and tendon, left upper arm: Secondary | ICD-10-CM | POA: Diagnosis not present

## 2021-02-21 DIAGNOSIS — E559 Vitamin D deficiency, unspecified: Secondary | ICD-10-CM

## 2021-02-21 MED ORDER — CLOBETASOL PROPIONATE 0.05 % EX OINT: 1.0000 "application " | TOPICAL_OINTMENT | Freq: Two times a day (BID) | CUTANEOUS | 1 refills | Status: DC

## 2021-02-21 MED ORDER — LISINOPRIL 10 MG PO TABS: 10.0000 mg | ORAL_TABLET | Freq: Every day | ORAL | 3 refills | Status: DC

## 2021-02-21 MED ORDER — LEVOTHYROXINE SODIUM 150 MCG PO TABS: 150.0000 ug | ORAL_TABLET | Freq: Every day | ORAL | 0 refills | Status: DC

## 2021-02-21 NOTE — Assessment & Plan Note (Signed)
R back pain Tender to palpation Advised reduced stress Ensure proper posture and ergonomic position when at computer

## 2021-02-21 NOTE — Assessment & Plan Note (Signed)
Start on ACEi Has been elevated > 1 year Revisit in 4 wks- for recheck and dose verification

## 2021-02-21 NOTE — Assessment & Plan Note (Signed)
Has not been taking SSRI- now that she lives alone Non receptive to teaching regarding use of medication daily and common adverse reactions

## 2021-02-21 NOTE — Assessment & Plan Note (Signed)
R frontal headache Addressed rebound headache concerns as well as other causes Continue to monitor

## 2021-02-21 NOTE — Progress Notes (Signed)
Established patient visit   Patient: Tammy Bailey   DOB: 24-Jul-1955   65 y.o. Female  MRN: [id number] Visit Date: 02/21/2021  Today's healthcare provider: Gwyneth Sprout, FNP   Chief Complaint  Patient presents with   Hypothyroidism   Rash   vitamin D deficiency   Subjective    Rash This is a recurrent problem. The current episode started more than 1 month ago. The problem is unchanged. The affected locations include the left hand and right hand. She was exposed to nothing. Pertinent negatives include no anorexia, congestion, cough, diarrhea, eye pain, facial edema, fatigue, fever, joint pain, nail changes, rhinorrhea, shortness of breath, sore throat or vomiting. Past treatments include nothing. There is no history of eczema.   Hypothyroid, follow-up  Lab Results  Component Value Date   TSH 10.900 (H) 07/23/2020   TSH 0.776 11/16/2019   TSH 0.081 (L) 06/12/2019   T4TOTAL 10.3 11/16/2019   Wt Readings from Last 3 Encounters:  02/21/21 209 lb 1.6 oz (94.8 kg)  07/22/20 206 lb 4.8 oz (93.6 kg)  02/01/20 208 lb 6.4 oz (94.5 kg)    She was last seen for hypothyroid 7 months ago.  Management since that visit includes none. She reports excellent compliance with treatment. She is not having side effects.   Symptoms: Yes change in energy level No constipation  No diarrhea No heat / cold intolerance  No nervousness No palpitations  No weight changes    -----------------------------------------------------------------------------------------  Vitamin D deficiency, follow-up  Lab Results  Component Value Date   VD25OH 48.4 07/23/2020   VD25OH 15.5 (L) 06/12/2019   VD25OH 36.3 06/03/2018   CALCIUM 9.3 07/23/2020   CALCIUM 9.1 06/12/2019  ) Wt Readings from Last 3 Encounters:  02/21/21 209 lb 1.6 oz (94.8 kg)  07/22/20 206 lb 4.8 oz (93.6 kg)  02/01/20 208 lb 6.4 oz (94.5 kg)    She was last seen for vitamin D deficiency 7 months ago.  Management since that  visit includes none. She reports excellent compliance with treatment. She is not having side effects.   Symptoms: Yes change in energy level No numbness or tingling  No bone pain No unexplained fracture   ---------------------------------------------------------------------------------------------------   Medications: Outpatient Medications Prior to Visit  Medication Sig   esomeprazole (NEXIUM) 20 MG packet Take 20 mg by mouth as needed.   estradiol (ESTRACE) 1 MG tablet TAKE 1 TABLET BY MOUTH ONCE DAILY   Vitamin D, Ergocalciferol, (DRISDOL) 1.25 MG (50000 UNIT) CAPS capsule TAKE 1 CAPSULE BY MOUTH EVERY 7 DAYS   [DISCONTINUED] escitalopram (LEXAPRO) 10 MG tablet Take 1 tablet (10 mg total) by mouth at bedtime. (Patient taking differently: Take 10 mg by mouth as needed.)   [DISCONTINUED] levothyroxine (SYNTHROID) 150 MCG tablet Take 1 tablet (150 mcg total) by mouth daily.   No facility-administered medications prior to visit.    Review of Systems  Constitutional:  Negative for fatigue and fever.  HENT:  Negative for congestion, rhinorrhea and sore throat.   Eyes:  Negative for pain.  Respiratory:  Negative for cough and shortness of breath.   Gastrointestinal:  Negative for anorexia, diarrhea and vomiting.  Musculoskeletal:  Negative for joint pain.  Skin:  Positive for rash. Negative for nail changes.      Objective    BP (!) 150/73   Pulse 66   Temp 98 F (36.7 C) (Oral)   Resp 16   Wt 209 lb 1.6 oz (94.8  kg)   SpO2 96%   BMI 42.23 kg/m  {Show previous vital signs (optional):23777}  Physical Exam Vitals and nursing note reviewed.  Constitutional:      General: She is not in acute distress.    Appearance: Normal appearance. She is obese. She is not ill-appearing, toxic-appearing or diaphoretic.  HENT:     Head: Normocephalic and atraumatic.  Cardiovascular:     Rate and Rhythm: Normal rate and regular rhythm.     Pulses: Normal pulses.     Heart sounds:  Normal heart sounds. No murmur heard.   No friction rub. No gallop.  Pulmonary:     Effort: Pulmonary effort is normal. No respiratory distress.     Breath sounds: Normal breath sounds. No stridor. No wheezing, rhonchi or rales.  Chest:     Chest wall: No tenderness.  Abdominal:     General: Bowel sounds are normal.     Palpations: Abdomen is soft.  Musculoskeletal:        General: Tenderness present. No swelling, deformity or signs of injury.     Right shoulder: Normal.     Left shoulder: Normal.     Left upper arm: Tenderness present.       Arms:     Cervical back: Normal.     Thoracic back: Tenderness present. Decreased range of motion.       Back:     Right lower leg: No edema.     Left lower leg: No edema.     Comments: Unable to reach behind- ex unhook bra  Skin:    General: Skin is warm and dry.     Capillary Refill: Capillary refill takes less than 2 seconds.     Coloration: Skin is not jaundiced or pale.     Findings: Rash present. No bruising, erythema or lesion.       Neurological:     General: No focal deficit present.     Mental Status: She is alert and oriented to person, place, and time. Mental status is at baseline.     Cranial Nerves: No cranial nerve deficit.     Sensory: No sensory deficit.     Motor: No weakness.     Coordination: Coordination normal.  Psychiatric:        Mood and Affect: Mood normal.        Behavior: Behavior normal.        Thought Content: Thought content normal.        Judgment: Judgment normal.    No results found for any visits on 02/21/21.  Assessment & Plan     Problem List Items Addressed This Visit       Cardiovascular and Mediastinum   Primary hypertension    Start on ACEi Has been elevated > 1 year Revisit in 4 wks- for recheck and dose verification      Relevant Medications   lisinopril (ZESTRIL) 10 MG tablet     Endocrine   Adult hypothyroidism    Chronic, unstable Repeat lab work today 30 day supply  of medication sent       Relevant Medications   levothyroxine (SYNTHROID) 150 MCG tablet   Other Relevant Orders   CBC with Differential/Platelet   Comprehensive metabolic panel   Hemoglobin A1c   VITAMIN D 25 Hydroxy (Vit-D Deficiency, Fractures)   TSH   T4, free   Magnesium   Lipid panel     Nervous and Auditory   Chronic tension-type headache, intractable  R frontal headache Addressed rebound headache concerns as well as other causes Continue to monitor        Musculoskeletal and Integument   Nummular eczematous dermatitis    2 cm round patch R hand .8 cm round patch L hand  Flat, shiny, red- no scaling, no central clearing, no umbilication No itching No drainage/discharge      Relevant Medications   clobetasol ointment (TEMOVATE) 0.05 %   Tendinopathy of left biceps tendon    Acute concern, wax/wane  L bicep area Unable to reach behind No shoulder involvement Referral to PT Recommend OTC ibuprofen       Relevant Orders   Ambulatory referral to Physical Therapy     Other   Vitamin D deficiency    Recheck today      Relevant Orders   VITAMIN D 25 Hydroxy (Vit-D Deficiency, Fractures)   Clinical depression - Primary    Has not been taking SSRI- now that she lives alone Non receptive to teaching regarding use of medication daily and common adverse reactions      Relevant Orders   CBC with Differential/Platelet   Comprehensive metabolic panel   Hemoglobin A1c   VITAMIN D 25 Hydroxy (Vit-D Deficiency, Fractures)   TSH   T4, free   Magnesium   Lipid panel   Chronic scapular pain    R back pain Tender to palpation Advised reduced stress Ensure proper posture and ergonomic position when at computer      Relevant Orders   Ambulatory referral to Physical Therapy   Always tired    Reports sleeping 10 hours a day, falling asleep during day Does not know she is napping until she drops something that was in her hand Denies snoring or waking up  gasping for breath Iron panel ordered as well as additional lab work      Relevant Orders   CBC with Differential/Platelet   Comprehensive metabolic panel   Hemoglobin A1c   VITAMIN D 25 Hydroxy (Vit-D Deficiency, Fractures)   TSH   T4, free   Magnesium   Lipid panel     Return in about 4 weeks (around 03/21/2021) for chonic disease management.     Vonna Kotyk, FNP, have reviewed all documentation for this visit. The documentation on 02/21/21 for the exam, diagnosis, procedures, and orders are all accurate and complete.    Gwyneth Sprout, [place] [phone] (phone) [phone] (fax)  [place]

## 2021-02-21 NOTE — Assessment & Plan Note (Signed)
Acute concern, wax/wane  L bicep area Unable to reach behind No shoulder involvement Referral to PT Recommend OTC ibuprofen

## 2021-02-21 NOTE — Assessment & Plan Note (Signed)
Chronic, unstable Repeat lab work today 30 day supply of medication sent

## 2021-02-21 NOTE — Assessment & Plan Note (Signed)
Recheck today. 

## 2021-02-21 NOTE — Assessment & Plan Note (Signed)
2 cm round patch R hand .8 cm round patch L hand  Flat, shiny, red- no scaling, no central clearing, no umbilication No itching No drainage/discharge

## 2021-02-21 NOTE — Assessment & Plan Note (Signed)
Reports sleeping 10 hours a day, falling asleep during day Does not know she is napping until she drops something that was in her hand Denies snoring or waking up gasping for breath Iron panel ordered as well as additional lab work

## 2021-02-22 LAB — COMPREHENSIVE METABOLIC PANEL
ALT: 12 IU/L (ref 0–32)
AST: 13 IU/L (ref 0–40)
Albumin/Globulin Ratio: 1.7 (ref 1.2–2.2)
Albumin: 4.3 g/dL (ref 3.8–4.8)
Alkaline Phosphatase: 94 IU/L (ref 44–121)
BUN/Creatinine Ratio: 18 (ref 12–28)
BUN: 13 mg/dL (ref 8–27)
Bilirubin Total: 0.3 mg/dL (ref 0.0–1.2)
CO2: 22 mmol/L (ref 20–29)
Calcium: 9.5 mg/dL (ref 8.7–10.3)
Chloride: 102 mmol/L (ref 96–106)
Creatinine, Ser: 0.71 mg/dL (ref 0.57–1.00)
Globulin, Total: 2.5 g/dL (ref 1.5–4.5)
Glucose: 107 mg/dL — ABNORMAL HIGH (ref 65–99)
Potassium: 4.1 mmol/L (ref 3.5–5.2)
Sodium: 140 mmol/L (ref 134–144)
Total Protein: 6.8 g/dL (ref 6.0–8.5)
eGFR: 94 mL/min/{1.73_m2} (ref >59)

## 2021-02-22 LAB — CBC WITH DIFFERENTIAL/PLATELET
Basophils Absolute: 0.1 10*3/uL (ref 0.0–0.2)
Basos: 1 %
EOS (ABSOLUTE): 0.2 10*3/uL (ref 0.0–0.4)
Eos: 2 %
Hematocrit: 41 % (ref 34.0–46.6)
Hemoglobin: 14 g/dL (ref 11.1–15.9)
Immature Grans (Abs): 0 10*3/uL (ref 0.0–0.1)
Immature Granulocytes: 0 %
Lymphocytes Absolute: 2.3 10*3/uL (ref 0.7–3.1)
Lymphs: 30 %
MCH: 30.3 pg (ref 26.6–33.0)
MCHC: 34.1 g/dL (ref 31.5–35.7)
MCV: 89 fL (ref 79–97)
Monocytes Absolute: 0.7 10*3/uL (ref 0.1–0.9)
Monocytes: 9 %
Neutrophils Absolute: 4.3 10*3/uL (ref 1.4–7.0)
Neutrophils: 58 %
Platelets: 351 10*3/uL (ref 150–450)
RBC: 4.62 x10E6/uL (ref 3.77–5.28)
RDW: 12 % (ref 11.7–15.4)
WBC: 7.5 10*3/uL (ref 3.4–10.8)

## 2021-02-22 LAB — TSH: TSH: 8.58 u[IU]/mL — ABNORMAL HIGH (ref 0.450–4.500)

## 2021-02-22 LAB — HEMOGLOBIN A1C
Est. average glucose Bld gHb Est-mCnc: 108 mg/dL
Hgb A1c MFr Bld: 5.4 % (ref 4.8–5.6)

## 2021-02-22 LAB — VITAMIN D 25 HYDROXY (VIT D DEFICIENCY, FRACTURES): Vit D, 25-Hydroxy: 50.5 ng/mL (ref 30.0–100.0)

## 2021-02-22 LAB — LIPID PANEL
Chol/HDL Ratio: 3 ratio (ref 0.0–4.4)
Cholesterol, Total: 192 mg/dL (ref 100–199)
HDL: 65 mg/dL (ref >39)
LDL Chol Calc (NIH): 109 mg/dL — ABNORMAL HIGH (ref 0–99)
Triglycerides: 100 mg/dL (ref 0–149)
VLDL Cholesterol Cal: 18 mg/dL (ref 5–40)

## 2021-02-22 LAB — T4, FREE: Free T4: 0.77 ng/dL — ABNORMAL LOW (ref 0.82–1.77)

## 2021-02-22 LAB — MAGNESIUM: Magnesium: 1.9 mg/dL (ref 1.6–2.3)

## 2021-02-24 ENCOUNTER — Other Ambulatory Visit: Payer: Self-pay | Admitting: Family Medicine

## 2021-02-24 ENCOUNTER — Telehealth: Payer: Self-pay

## 2021-02-24 DIAGNOSIS — E039 Hypothyroidism, unspecified: Secondary | ICD-10-CM

## 2021-02-24 MED ORDER — LEVOTHYROXINE SODIUM 175 MCG PO TABS: 175.0000 ug | ORAL_TABLET | Freq: Every day | ORAL | 3 refills | Status: DC

## 2021-02-24 NOTE — Telephone Encounter (Signed)
Copied from [place] [id number]. Topic: General - Inquiry >> Feb 24, 2021  3:29 PM Greggory Keen D wrote: Reason for CRM: t called asking if some one will call her about her lab results.  CB#  [phone]

## 2021-04-03 ENCOUNTER — Other Ambulatory Visit: Payer: Self-pay | Admitting: Family Medicine

## 2021-04-03 DIAGNOSIS — Z780 Asymptomatic menopausal state: Secondary | ICD-10-CM

## 2021-04-03 NOTE — Telephone Encounter (Signed)
Requested medications are due for refill today yes  Requested medications are on the active medication list yes  Last refill 11/19/20  Last visit 02/21/21  Future visit scheduled no  Notes to clinic  failed protocol of mammogram up to date per Health Maintenance, please assess.  Requested Prescriptions  Pending Prescriptions Disp Refills   estradiol (ESTRACE) 1 MG tablet [Pharmacy Med Name: ESTRADIOL 1 MG TAB] 90 tablet 1    Sig: TAKE 1 TABLET BY MOUTH ONCE DAILY     OB/GYN:  Estrogens Failed - 04/03/2021 10:15 AM      Failed - Mammogram is up-to-date per Health Maintenance      Failed - Last BP in normal range    BP Readings from Last 1 Encounters:  02/21/21 (!) 150/73          Passed - Valid encounter within last 12 months    Recent Outpatient Visits           1 month ago Moderate episode of recurrent major depressive disorder [facility])   [facility] Gwyneth Sprout, FNP   8 months ago Annual physical exam   [facility] F[place] Malling M, [place]   8 months ago Acute non-recurrent pansinusitis   [place], [place]   12 months ago Subacute maxillary sinusitis   [place], PA-C   1 year ago Fatigue, unspecified type   [facility], Clearnce Sorrel, [place]       Future Appointments             In 1 month Ralene Bathe, MD [place]

## 2021-04-07 ENCOUNTER — Ambulatory Visit: Payer: Self-pay

## 2021-04-07 NOTE — Telephone Encounter (Signed)
Unable to connect with pt. Messaged to be forwarded to the office.

## 2021-04-08 ENCOUNTER — Ambulatory Visit: Payer: Self-pay | Admitting: *Deleted

## 2021-04-08 NOTE — Telephone Encounter (Signed)
Pt reports urinary frequency and bladder pressure x 3 days, worse day yesterday. States "Somewhat better today." Still with frequency, worse symptom is "Bladder pressure." Denies dysuria, no visible blood, no fever, denies back, flank pain. States had UTI months ago "Got pretty bad,trying to stop this one." Also reports had bladder tacking about 8 years ago." Might need done again."  Does not see urologist. Pt is at work today, questioning if she can come in to leave urine specimen. No availability within protocol timeframe. Assured pt NT would route to practice for PCPs review and final disposition. Care advise given, aware may need UC eval. Pt verbalizes understanding.  CB# [phone]    Reason for Disposition  Urinating more frequently than usual (i.e., frequency)  Answer Assessment - Initial Assessment Questions 1. SYMPTOM: "What's the main symptom you're concerned about?" (e.g., frequency, incontinence)     Bladder pressure 2. ONSET: "When did the  *No Answer*  start?"     Few days, yesterday worse day 3. PAIN: "Is there any pain?" If Yes, ask: "How bad is it?" (Scale: 1-10; mild, moderate, severe)     No Bladder pressure 4. CAUSE: "What do you think is causing the symptoms?"     UTI or "Bladder falling again" 5. OTHER SYMPTOMS: "Do you have any other symptoms?" (e.g., fever, flank pain, blood in urine, pain with urination)     none  Protocols used: Urinary Symptoms-A-AH

## 2021-04-08 NOTE — Telephone Encounter (Signed)
FYI

## 2021-04-10 NOTE — Telephone Encounter (Signed)
Spoke with patient on the phone and advised her there are no available appts today or tomorrow in office. Offered patient an appt to be seen at [facility] and [facility] and she declined. Patient states that she has had symptoms on and off and can wait till she is seen by a provider. I informed patient that first available appt wouldn't be until 10/31 at [facility] patient request appt. Patient advised that symptoms worsen to seek medical treatment at nearest urgent care. KW

## 2021-04-14 ENCOUNTER — Encounter: Payer: Self-pay | Admitting: Family Medicine

## 2021-04-14 ENCOUNTER — Ambulatory Visit: Payer: 59 | Admitting: Family Medicine

## 2021-04-14 ENCOUNTER — Other Ambulatory Visit: Payer: Self-pay

## 2021-04-14 VITALS — BP 125/79 | HR 66 | Temp 98.2°F | Ht 59.0 in | Wt 206.9 lb

## 2021-04-14 DIAGNOSIS — I10 Essential (primary) hypertension: Secondary | ICD-10-CM

## 2021-04-14 DIAGNOSIS — J069 Acute upper respiratory infection, unspecified: Secondary | ICD-10-CM | POA: Insufficient documentation

## 2021-04-14 DIAGNOSIS — Z23 Encounter for immunization: Secondary | ICD-10-CM | POA: Insufficient documentation

## 2021-04-14 DIAGNOSIS — R399 Unspecified symptoms and signs involving the genitourinary system: Secondary | ICD-10-CM | POA: Diagnosis not present

## 2021-04-14 DIAGNOSIS — Z6841 Body Mass Index (BMI) 40.0 and over, adult: Secondary | ICD-10-CM

## 2021-04-14 LAB — POCT URINALYSIS DIPSTICK
Bilirubin, UA: NEGATIVE
Blood, UA: NEGATIVE
Glucose, UA: NEGATIVE
Ketones, UA: NEGATIVE
Leukocytes, UA: NEGATIVE
Nitrite, UA: NEGATIVE
Protein, UA: NEGATIVE
Spec Grav, UA: >=1.03 — AB (ref 1.010–1.025)
Urobilinogen, UA: 0.2 E.U./dL
pH, UA: 5 (ref 5.0–8.0)

## 2021-04-14 NOTE — Assessment & Plan Note (Signed)
All systems normal Continue supportive measures -OTC medications -hydration -sleep -exercise -healthy diet

## 2021-04-14 NOTE — Assessment & Plan Note (Signed)
Chronic, stable. Continue regimen. 

## 2021-04-14 NOTE — Assessment & Plan Note (Signed)
No CVA tenderness; reported some bladder leaking Recommend reduction in coffee, tea, soda, etc Recommend bladder training Recommend kegel exercises Referral at later date Dip negative- will send for cx

## 2021-04-14 NOTE — Assessment & Plan Note (Signed)
Discussed importance of healthy weight management Discussed diet and exercise Current BMI 41.79

## 2021-04-14 NOTE — Addendum Note (Signed)
Addended by: Barnie Mort on: 04/14/2021 04:38 PM   Modules accepted: Orders

## 2021-04-14 NOTE — Assessment & Plan Note (Signed)
Reviewed; provided

## 2021-04-14 NOTE — Progress Notes (Signed)
Established patient visit   Patient: Tammy Bailey   DOB: [birth year]   65 y.o. Female  MRN: [id number] Visit Date: 04/14/2021  Today's healthcare provider: Gwyneth Sprout, FNP   Chief Complaint  Patient presents with   Urinary Tract Infection   Subjective    HPI  Urinary Tract Infection Patient complains of dysuria, foul smelling urine, frequency, and hesitancy. She has had symptoms for 3 weeks. Patient also complains of sorethroat. Patient denies back pain, congestion, cough, fever, headache, rhinitis, stomach ache, and vaginal discharge. Patient does not have a history of recurrent UTI. Patient does not have a history of pyelonephritis.      Medications: Outpatient Medications Prior to Visit  Medication Sig   clobetasol ointment (TEMOVATE) 0.35 % Apply 1 application topically 2 (two) times daily.   esomeprazole (NEXIUM) 20 MG packet Take 20 mg by mouth as needed.   estradiol (ESTRACE) 1 MG tablet TAKE 1 TABLET BY MOUTH ONCE DAILY   levothyroxine (SYNTHROID) 175 MCG tablet Take 1 tablet (175 mcg total) by mouth daily.   lisinopril (ZESTRIL) 10 MG tablet Take 1 tablet (10 mg total) by mouth daily.   Vitamin D, Ergocalciferol, (DRISDOL) 1.25 MG (50000 UNIT) CAPS capsule TAKE 1 CAPSULE BY MOUTH EVERY 7 DAYS   No facility-administered medications prior to visit.    Review of Systems  Last CBC Lab Results  Component Value Date   WBC 7.5 02/21/2021   HGB 14.0 02/21/2021   HCT 41.0 02/21/2021   MCV 89 02/21/2021   MCH 30.3 02/21/2021   RDW 12.0 02/21/2021   PLT 351 00/93/8182   Last metabolic panel Lab Results  Component Value Date   GLUCOSE 107 (H) 02/21/2021   NA 140 02/21/2021   K 4.1 02/21/2021   CL 102 02/21/2021   CO2 22 02/21/2021   BUN 13 02/21/2021   CREATININE 0.71 02/21/2021   EGFR 94 02/21/2021   CALCIUM 9.5 02/21/2021   PROT 6.8 02/21/2021   ALBUMIN 4.3 02/21/2021   LABGLOB 2.5 02/21/2021   AGRATIO 1.7 02/21/2021   BILITOT 0.3 02/21/2021    ALKPHOS 94 02/21/2021   AST 13 02/21/2021   ALT 12 02/21/2021   Last lipids Lab Results  Component Value Date   CHOL 192 02/21/2021   HDL 65 02/21/2021   LDLCALC 109 (H) 02/21/2021   TRIG 100 02/21/2021   CHOLHDL 3.0 02/21/2021   Last hemoglobin A1c Lab Results  Component Value Date   HGBA1C 5.4 02/21/2021   Last thyroid functions Lab Results  Component Value Date   TSH 8.580 (H) 02/21/2021   T4TOTAL 10.3 11/16/2019   Last vitamin D Lab Results  Component Value Date   VD25OH 50.5 02/21/2021   Last vitamin B12 and Folate Lab Results  Component Value Date   VITAMINB12 1,402 (H) 07/23/2020       Objective    BP 125/79 (BP Location: Left Wrist, Patient Position: Sitting, Cuff Size: Normal)   Pulse 66   Temp 98.2 F (36.8 C) (Oral)   Ht 4' 11"  (1.499 m)   Wt 206 lb 14.4 oz (93.8 kg)   BMI 41.79 kg/m  BP Readings from Last 3 Encounters:  04/14/21 125/79  02/21/21 (!) 150/73  07/22/20 140/84   Wt Readings from Last 3 Encounters:  04/14/21 206 lb 14.4 oz (93.8 kg)  02/21/21 209 lb 1.6 oz (94.8 kg)  07/22/20 206 lb 4.8 oz (93.6 kg)      Physical Exam Vitals and  nursing note reviewed.  Constitutional:      General: She is not in acute distress.    Appearance: Normal appearance. She is obese. She is not ill-appearing, toxic-appearing or diaphoretic.  HENT:     Head: Normocephalic and atraumatic.  Cardiovascular:     Rate and Rhythm: Normal rate and regular rhythm.     Pulses: Normal pulses.     Heart sounds: Normal heart sounds. No murmur heard.   No friction rub. No gallop.  Pulmonary:     Effort: Pulmonary effort is normal. No respiratory distress.     Breath sounds: Normal breath sounds. No stridor. No wheezing, rhonchi or rales.  Chest:     Chest wall: No tenderness.  Abdominal:     General: Bowel sounds are normal. There is no distension.     Palpations: Abdomen is soft.     Tenderness: There is no abdominal tenderness. There is no right CVA  tenderness or left CVA tenderness.  Musculoskeletal:        General: No swelling, tenderness, deformity or signs of injury. Normal range of motion.     Right lower leg: No edema.     Left lower leg: No edema.  Skin:    General: Skin is warm and dry.     Capillary Refill: Capillary refill takes less than 2 seconds.     Coloration: Skin is not jaundiced or pale.     Findings: No bruising, erythema, lesion or rash.  Neurological:     General: No focal deficit present.     Mental Status: She is alert and oriented to person, place, and time. Mental status is at baseline.     Cranial Nerves: No cranial nerve deficit.     Sensory: No sensory deficit.     Motor: No weakness.     Coordination: Coordination normal.  Psychiatric:        Mood and Affect: Mood normal.        Behavior: Behavior normal.        Thought Content: Thought content normal.        Judgment: Judgment normal.   No results found for any visits on 04/14/21.  Assessment & Plan     Problem List Items Addressed This Visit       Cardiovascular and Mediastinum   Primary hypertension    Chronic, stable Continue regimen         Respiratory   Viral URI with cough    All systems normal Continue supportive measures -OTC medications -hydration -sleep -exercise -healthy diet        Other   Class 3 severe obesity due to excess calories with serious comorbidity and body mass index (BMI) of 40.0 to 44.9 in adult [facility])    Discussed importance of healthy weight management Discussed diet and exercise Current BMI 41.79      UTI symptoms - Primary    No CVA tenderness; reported some bladder leaking Recommend reduction in coffee, tea, soda, etc Recommend bladder training Recommend kegel exercises Referral at later date Dip negative- will send for cx      Relevant Orders   POCT Urinalysis Dipstick   Flu vaccine need    Reviewed; provided      Relevant Orders   Flu Vaccine QUAD High Dose(Fluad)     Return  for annual examination.      Vonna Kotyk, FNP, have reviewed all documentation for this visit. The documentation on 04/14/21 for the exam, diagnosis, procedures,  and orders are all accurate and complete.    Gwyneth Sprout, [place] [phone] (phone) [phone] (fax)  [place]

## 2021-04-17 ENCOUNTER — Ambulatory Visit: Payer: Self-pay

## 2021-04-17 NOTE — Telephone Encounter (Signed)
Patient called, left VM to return the call to the office to discuss symptoms with a nurse.  

## 2021-04-17 NOTE — Telephone Encounter (Signed)
The patient would like to be prescribed something to help with cough and congestion   The patient was seen in office on 04/14/21 but shares that their cough is continuing   Please contact further   Called patient to review symptoms . No answer, left message on voicemail to call clinic back. #[phone].

## 2021-04-18 LAB — URINE CULTURE

## 2021-04-18 NOTE — Progress Notes (Signed)
Call to check on patient; poor sampling- unable to differentiate one bacterial.  Please let us know if you have any questions.  Thank you,  Tally Joe, FNP

## 2021-04-18 NOTE — Telephone Encounter (Signed)
Patient returned call, I asked about her cough, is it any different than what it was when she was seen in the office. She says it's the same dry cough. She says she's taking Alka Seltzer Cold in the morning and afternoon, Dayquil during the day and Nyquil at night. She says the cough is bad at night that it wakes her up and she coughs so bad until tears come out her eyes, then she's not able to go back to sleep. I advised Daneil Dan recommended OTC medicines, advised I will send this to [place] and someone will call back with her recommendation, patient verbalized understanding.

## 2021-04-18 NOTE — Telephone Encounter (Signed)
Attempted to reach pt. Again about symptoms. Left message to call back.

## 2021-04-18 NOTE — Telephone Encounter (Signed)
Patient called, left VM to return the call to the office to speak to a nurse about continued cough. Patient was seen in the office on 04/14/21 and the cough was mentioned, OV notes from provider for viral URI with cough to use OTC medications, hydration, sleep, exercise, healthy diet.

## 2021-04-18 NOTE — Telephone Encounter (Signed)
Lmtcb if patient returns calls back please have patient triaged [place]. KW

## 2021-04-18 NOTE — Telephone Encounter (Signed)
Triage has reached out to patient multiple times for cough that was discussed at last office visit, please review and advise. KW

## 2021-04-21 ENCOUNTER — Other Ambulatory Visit: Payer: Self-pay | Admitting: Family Medicine

## 2021-04-21 DIAGNOSIS — J069 Acute upper respiratory infection, unspecified: Secondary | ICD-10-CM

## 2021-04-21 MED ORDER — GUAIFENESIN-DM 100-10 MG/5ML PO SYRP
10.0000 mL | ORAL_SOLUTION | ORAL | 0 refills | Status: DC | PRN
Start: 2021-04-21 — End: 2021-07-31

## 2021-05-05 ENCOUNTER — Other Ambulatory Visit: Payer: Self-pay

## 2021-05-05 ENCOUNTER — Ambulatory Visit: Payer: 59 | Admitting: Dermatology

## 2021-05-05 DIAGNOSIS — D492 Neoplasm of unspecified behavior of bone, soft tissue, and skin: Secondary | ICD-10-CM | POA: Diagnosis not present

## 2021-05-05 DIAGNOSIS — Z1283 Encounter for screening for malignant neoplasm of skin: Secondary | ICD-10-CM

## 2021-05-05 DIAGNOSIS — L578 Other skin changes due to chronic exposure to nonionizing radiation: Secondary | ICD-10-CM

## 2021-05-05 DIAGNOSIS — L820 Inflamed seborrheic keratosis: Secondary | ICD-10-CM

## 2021-05-05 NOTE — Patient Instructions (Addendum)
Prior to procedure, discussed risks of blister formation, small wound, skin dyspigmentation, or rare scar following cryotherapy. Recommend Vaseline ointment to treated areas while healing.   Cryotherapy Aftercare  Wash gently with soap and water everyday.   Apply Vaseline and Band-Aid daily until healed.    If You Need Anything After Your Visit  If you have any questions or concerns for your doctor, please call our main line at [phone] and press option 4 to reach your doctor's medical assistant. If no one answers, please leave a voicemail as directed and we will return your call as soon as possible. Messages left after 4 pm will be answered the following business day.   You may also send [place] a message via [place]. We typically respond to MyChart messages within 1-2 business days.  For prescription refills, please ask your pharmacy to contact our office. Our fax number is [phone].  If you have an urgent issue when the clinic is closed that cannot wait until the next business day, you can page your doctor at the number below.    Please note that while we do our best to be available for urgent issues outside of office hours, we are not available 24/7.   If you have an urgent issue and are unable to reach [place], you may choose to seek medical care at your doctor's office, retail clinic, urgent care center, or emergency room.  If you have a medical emergency, please immediately call 911 or go to the emergency department.  Pager Numbers  - Dr. Nehemiah Massed: [phone]  - Dr. Laurence Ferrari: [phone]  - Dr. Nicole Kindred: [phone]  In the event of inclement weather, please call our main line at [phone] for an update on the status of any delays or closures.  Dermatology Medication Tips: Please keep the boxes that topical medications come in in order to help keep track of the instructions about where and how to use these. Pharmacies typically print the medication instructions only on the  boxes and not directly on the medication tubes.   If your medication is too expensive, please contact our office at [phone] option 4 or send [place] a message through [place].   We are unable to tell what your co-pay for medications will be in advance as this is different depending on your insurance coverage. However, we may be able to find a substitute medication at lower cost or fill out paperwork to get insurance to cover a needed medication.   If a prior authorization is required to get your medication covered by your insurance company, please allow [place] 1-2 business days to complete this process.  Drug prices often vary depending on where the prescription is filled and some pharmacies may offer cheaper prices.  The website www.goodrx.com contains coupons for medications through different pharmacies. The prices here do not account for what the cost may be with help from insurance (it may be cheaper with your insurance), but the website can give you the price if you did not use any insurance.  - You can print the associated coupon and take it with your prescription to the pharmacy.  - You may also stop by our office during regular business hours and pick up a GoodRx coupon card.  - If you need your prescription sent electronically to a different pharmacy, notify our office through [facility] or by phone at [phone] option 4.     Si Usted Necesita Algo Despus de Su Visita  Tambin puede enviarnos un mensaje a  travs de MyChart. Por lo general respondemos a los mensajes de MyChart en el transcurso de 1 a 2 das hbiles.  Para renovar recetas, por favor pida a su farmacia que se ponga en contacto con nuestra oficina. Harland Dingwall de fax es [place] [phone].  Si tiene un asunto urgente cuando la clnica est cerrada y que no puede esperar hasta el siguiente da hbil, puede llamar/localizar a su doctor(a) al nmero que aparece a continuacin.   Por favor, tenga en cuenta que  aunque hacemos todo lo posible para estar disponibles para asuntos urgentes fuera del horario de [place], no estamos disponibles las 24 horas del da, los 7 das de la [place].   Si tiene un problema urgente y no puede comunicarse con nosotros, puede optar por buscar atencin mdica  en el consultorio de su doctor(a), en una clnica privada, en un centro de atencin urgente o en una sala de emergencias.  Si tiene Engineering geologist, por favor llame inmediatamente al 911 o vaya a la sala de emergencias.  Nmeros de bper  - Dr. Nehemiah Massed: [phone]  - Dra. Moye: [phone]  - Dra. Nicole Kindred: [phone]  En caso de inclemencias del [place], por favor llame a Johnsie Kindred principal al [phone] para una actualizacin sobre el [place] de cualquier retraso o cierre.  Consejos para la medicacin en dermatologa: Por favor, guarde las cajas en las que vienen los medicamentos de uso tpico para ayudarle a seguir las instrucciones sobre dnde y cmo usarlos. Las farmacias generalmente imprimen las instrucciones del medicamento slo en las cajas y no directamente en los tubos del [place].   Si su medicamento es muy caro, por favor, pngase en contacto con Zigmund Daniel llamando al [phone] y presione la opcin 4 o envenos un mensaje a travs de Pharmacist, community.   No podemos decirle cul ser su copago por los medicamentos por adelantado ya que esto es diferente dependiendo de la cobertura de su seguro. Sin embargo, es posible que podamos encontrar un medicamento sustituto a Electrical engineer un formulario para que el seguro cubra el medicamento que se considera necesario.   Si se requiere una autorizacin previa para que su compaa de seguros [place] su medicamento, por favor permtanos de 1 a 2 das hbiles para completar este proceso.  Los precios de los medicamentos varan con frecuencia dependiendo del Environmental consultant de dnde se surte la receta y alguna farmacias pueden ofrecer precios ms  baratos.  El sitio web www.goodrx.com tiene cupones para medicamentos de Airline pilot. Los precios aqu no tienen en cuenta lo que podra costar con la ayuda del seguro (puede ser ms barato con su seguro), pero el sitio web puede darle el precio si no utiliz Research scientist (physical sciences).  - Puede imprimir el cupn correspondiente y llevarlo con su receta a la farmacia.  - Tambin puede pasar por nuestra oficina durante el horario de atencin regular y Charity fundraiser una tarjeta de cupones de GoodRx.  - Si necesita que su receta se enve electrnicamente a una farmacia diferente, informe a nuestra oficina a travs de MyChart de [place] o por telfono llamando al [phone] y presione la opcin 4.

## 2021-05-05 NOTE — Progress Notes (Signed)
   New Patient Visit  Subjective  Tammy Bailey is a 65 y.o. female who presents for the following: Skin Problem (Check spots on her body, changing color and growing ). No history of skin cancer.  The patient presents for Upper Body Skin Exam (UBSE) for skin cancer screening and mole check.  The patient has spots, moles and lesions to be evaluated, some may be new or changing and the patient has concerns that these could be cancer.  The following portions of the chart were reviewed this encounter and updated as appropriate:   Tobacco  Allergies  Meds  Problems  Med Hx  Surg Hx  Fam Hx     Review of Systems:  No other skin or systemic complaints except as noted in HPI or Assessment and Plan.  Objective  Well appearing patient in no apparent distress; mood and affect are within normal limits.  All skin waist up examined.  posterior braline x 2, left side braline x 1, right side braline x 1  (4) (4) Erythematous keratotic or waxy stuck-on papule or plaque.   right lower abdomen 1.2 cm flesh papule    Assessment & Plan  Inflamed seborrheic keratosis posterior braline x 2, left side braline x 1, right side braline x 1  (4)  Destruction of lesion - posterior braline x 2, left side braline x 1, right side braline x 1  (4) Complexity: simple   Destruction method: cryotherapy   Informed consent: discussed and consent obtained   Timeout:  patient name, date of birth, surgical site, and procedure verified Lesion destroyed using liquid nitrogen: Yes   Region frozen until ice ball extended beyond lesion: Yes   Outcome: patient tolerated procedure well with no complications   Post-procedure details: wound care instructions given    Neoplasm of skin right lower abdomen Suspect benign appearing irritated nevus  Recommend removal if bothersome or if change.  Actinic Damage - chronic, secondary to cumulative UV radiation exposure/sun exposure over time - diffuse scaly erythematous  macules with underlying dyspigmentation - Recommend daily broad spectrum sunscreen SPF 30+ to sun-exposed areas, reapply every 2 hours as needed.  - Recommend staying in the shade or wearing long sleeves, sun glasses (UVA+UVB protection) and wide brim hats (4-inch brim around the entire circumference of the hat). - Call for new or changing lesions.  Skin cancer screening  Return in about 3 months (around 08/05/2021) for ISK .  I, Marye Round, CMA, am acting as scribe for Sarina Ser, MD .  Documentation: I have reviewed the above documentation for accuracy and completeness, and I agree with the above.  Sarina Ser, MD

## 2021-05-19 ENCOUNTER — Encounter: Payer: Self-pay | Admitting: Dermatology

## 2021-05-19 ENCOUNTER — Ambulatory Visit: Payer: Self-pay | Admitting: *Deleted

## 2021-05-19 NOTE — Telephone Encounter (Signed)
Pt reports left leg "Achy" x 2 months, worsening past week. States "Starts at hip, goes down leg to foot." States both legs swell at times. Denies any redness, warmth to left leg. Reports "Left arm also achy at times." States "Feels heavy at times," goes from shoulder to wrist. Denies CP, no SOB. States leg pain subsides after walking for while, was to have foot surgery "Ligaments and tendons" but postponed. Appt made with Dr. Caryn Section tomorrow as pt's schedule, needed latest appt.  CAre advise per protocol given, pt verbalizes understanding.       Reason for Disposition  [1] MODERATE pain (e.g., interferes with normal activities, limping) AND [2] present > 3 days  Answer Assessment - Initial Assessment Questions 1. ONSET: "When did the pain start?"      2 months 2. LOCATION: "Where is the pain located?"      Starts at left hip, through leg, down to foot 3. PAIN: "How bad is the pain?"    (Scale 1-10; or mild, moderate, severe)   -  MILD (1-3): doesn't interfere with normal activities    -  MODERATE (4-7): interferes with normal activities (e.g., work or school) or awakens from sleep, limping    -  SEVERE (8-10): excruciating pain, unable to do any normal activities, unable to walk     Just an ache. Feels better after walking for a while. 4. WORK OR EXERCISE: "Has there been any recent work or exercise that involved this part of the body?"      No 5. CAUSE: "What do you think is causing the leg pain?"     Maybe from foot, ligament and tendon surgery postponed 6. OTHER SYMPTOMS: "Do you have any other symptoms?" (e.g., chest pain, back pain, breathing difficulty, swelling, rash, fever, numbness, weakness)     Both legs swell at times, left arm achy  at times  Protocols used: Leg Pain-A-AH

## 2021-05-20 ENCOUNTER — Other Ambulatory Visit: Payer: Self-pay

## 2021-05-20 ENCOUNTER — Encounter: Payer: Self-pay | Admitting: Family Medicine

## 2021-05-20 ENCOUNTER — Ambulatory Visit: Payer: 59 | Admitting: Family Medicine

## 2021-05-20 VITALS — BP 142/68 | HR 65 | Temp 98.2°F | Wt 206.0 lb

## 2021-05-20 DIAGNOSIS — M5432 Sciatica, left side: Secondary | ICD-10-CM | POA: Diagnosis not present

## 2021-05-20 DIAGNOSIS — M779 Enthesopathy, unspecified: Secondary | ICD-10-CM

## 2021-05-20 MED ORDER — MELOXICAM 15 MG PO TABS: 15.0000 mg | ORAL_TABLET | Freq: Every day | ORAL | 0 refills | Status: DC

## 2021-05-20 MED ORDER — GABAPENTIN 300 MG PO CAPS: 300.0000 mg | ORAL_CAPSULE | Freq: Three times a day (TID) | ORAL | 0 refills | Status: DC

## 2021-05-20 NOTE — Telephone Encounter (Signed)
Tammy RainierAmparo Bailey

## 2021-05-20 NOTE — Progress Notes (Signed)
Established patient visit   Patient: Tammy Bailey   DOB: 1955/09/20   65 y.o. Female  MRN: [id number] Visit Date: 05/20/2021  Today's healthcare provider: Lelon Huh, MD   Chief Complaint  Patient presents with   Leg Pain   Arm Problem   Subjective    Leg Pain  Incident onset: 2 month ago. There was no injury mechanism. The pain is present in the left hip, left leg and left foot. Associated symptoms include muscle weakness, numbness and tingling. Pertinent negatives include no inability to bear weight, loss of motion or loss of sensation. She has tried acetaminophen and NSAIDs for the symptoms. The treatment provided mild relief.  Arm Pain  There was no injury mechanism. The pain is present in the upper left arm. Associated symptoms include muscle weakness, numbness and tingling. The symptoms are aggravated by lifting, movement and palpation.       Medications: Outpatient Medications Prior to Visit  Medication Sig   clobetasol ointment (TEMOVATE) 0.35 % Apply 1 application topically 2 (two) times daily.   esomeprazole (NEXIUM) 20 MG packet Take 20 mg by mouth as needed.   estradiol (ESTRACE) 1 MG tablet TAKE 1 TABLET BY MOUTH ONCE DAILY   levothyroxine (SYNTHROID) 175 MCG tablet Take 1 tablet (175 mcg total) by mouth daily.   lisinopril (ZESTRIL) 10 MG tablet Take 1 tablet (10 mg total) by mouth daily.   Vitamin D, Ergocalciferol, (DRISDOL) 1.25 MG (50000 UNIT) CAPS capsule TAKE 1 CAPSULE BY MOUTH EVERY 7 DAYS   guaiFENesin-dextromethorphan (ROBITUSSIN DM) 100-10 MG/5ML syrup Take 10 mLs by mouth every 4 (four) hours as needed for cough.   No facility-administered medications prior to visit.    Review of Systems  Constitutional: Negative.   Musculoskeletal:  Positive for myalgias. Negative for arthralgias, back pain, gait problem, joint swelling, neck pain and neck stiffness.  Neurological:  Positive for tingling and numbness. Negative for dizziness, weakness,  light-headedness and headaches.      Objective    BP (!) 142/68 (BP Location: Right Wrist, Patient Position: Sitting, Cuff Size: Normal)   Pulse 65   Temp 98.2 F (36.8 C) (Oral)   Wt 206 lb (93.4 kg)   SpO2 99%   BMI 41.61 kg/m   General appearance: Obese female, cooperative and in no acute distress Head: Normocephalic, without obvious abnormality, atraumatic Respiratory: Respirations even and unlabored, normal respiratory rate Extremities: Diffusely tender left lateral arm into elbow. Pain reproduced by active supination.  Negative straight leg raise bilaterally. Slight tenderness of left upper buttocks.  Skin: Skin color, texture, turgor normal. No rashes seen  Psych: Appropriate mood and affect. Neurologic: Mental status: Alert, oriented to person, place, and time, thought content appropriate.     Assessment & Plan     1. Sciatica of left side  - meloxicam (MOBIC) 15 MG tablet; Take 1 tablet (15 mg total) by mouth daily.  Dispense: 30 tablet; Refill: 0 - gabapentin (NEURONTIN) 300 MG capsule; Take 1 capsule (300 mg total) by mouth 3 (three) times daily.  Dispense: 30 capsule; Refill: 0  Consider PT if no improvement in 3-4 weeks.   2. Tendonitis left upper arm Expect some improvement with above. Consider OT if no improvement in 3-4 weeks.          The entirety of the information documented in the History of Present Illness, Review of Systems and Physical Exam were personally obtained by me. Portions of this information were initially documented  by the [facility] and reviewed by me for thoroughness and accuracy.     Lelon Huh, MD  [facility] [phone] (phone) [phone] (fax)  [place]
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
## 2021-07-31 ENCOUNTER — Ambulatory Visit (INDEPENDENT_AMBULATORY_CARE_PROVIDER_SITE_OTHER): Payer: 59 | Admitting: Family Medicine

## 2021-07-31 ENCOUNTER — Encounter: Payer: Self-pay | Admitting: Family Medicine

## 2021-07-31 ENCOUNTER — Other Ambulatory Visit: Payer: Self-pay

## 2021-07-31 VITALS — BP 150/82 | HR 68 | Temp 98.5°F | Wt 211.0 lb

## 2021-07-31 DIAGNOSIS — E7800 Pure hypercholesterolemia, unspecified: Secondary | ICD-10-CM | POA: Diagnosis not present

## 2021-07-31 DIAGNOSIS — N3946 Mixed incontinence: Secondary | ICD-10-CM | POA: Diagnosis not present

## 2021-07-31 DIAGNOSIS — E039 Hypothyroidism, unspecified: Secondary | ICD-10-CM

## 2021-07-31 DIAGNOSIS — R739 Hyperglycemia, unspecified: Secondary | ICD-10-CM | POA: Diagnosis not present

## 2021-07-31 DIAGNOSIS — R69 Illness, unspecified: Secondary | ICD-10-CM | POA: Diagnosis not present

## 2021-07-31 DIAGNOSIS — F4323 Adjustment disorder with mixed anxiety and depressed mood: Secondary | ICD-10-CM | POA: Diagnosis not present

## 2021-07-31 DIAGNOSIS — Z0000 Encounter for general adult medical examination without abnormal findings: Secondary | ICD-10-CM | POA: Diagnosis not present

## 2021-07-31 DIAGNOSIS — R6889 Other general symptoms and signs: Secondary | ICD-10-CM

## 2021-07-31 DIAGNOSIS — Z1231 Encounter for screening mammogram for malignant neoplasm of breast: Secondary | ICD-10-CM

## 2021-07-31 DIAGNOSIS — I10 Essential (primary) hypertension: Secondary | ICD-10-CM | POA: Diagnosis not present

## 2021-07-31 DIAGNOSIS — N819 Female genital prolapse, unspecified: Secondary | ICD-10-CM | POA: Diagnosis not present

## 2021-07-31 MED ORDER — LISINOPRIL 10 MG PO TABS: 10.0000 mg | ORAL_TABLET | Freq: Every day | ORAL | 3 refills | Status: DC

## 2021-07-31 NOTE — Assessment & Plan Note (Signed)
LDL goal <100

## 2021-07-31 NOTE — Assessment & Plan Note (Signed)
Chronic, elevated; has been out of her lisinopril; last filled 9/9 Denies CP Denies SOB Denies DOE No LE Edema noted on exam Continue medication Refills provided- with note that was missed fill date 12/9 Seek emergent care if you develop CP, chest pain or chest pressure

## 2021-07-31 NOTE — Assessment & Plan Note (Signed)
Check TSH and free t4 and CBC
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
## 2021-07-31 NOTE — Assessment & Plan Note (Signed)
Denies complaints; due for screening mammo

## 2021-07-31 NOTE — Assessment & Plan Note (Signed)
Due for dental Due for vision encouraged to call and schedule appts  Things to do to keep yourself healthy  - Exercise at least 30-45 minutes a day, 3-4 days a week.  - Eat a low-fat diet with lots of fruits and vegetables, up to 7-9 servings per day.  - Seatbelts can save your life. Wear them always.  - Smoke detectors on every level of your home, check batteries every year.  - Eye Doctor - have an eye exam every 1-2 years  - Safe sex - if you may be exposed to STDs, use a condom.  - Alcohol -  If you drink, do it moderately, less than 2 drinks per day.  - [place]. Choose someone to speak for you if you are not able.  - Depression is common in our stressful world.If you're feeling down or losing interest in things you normally enjoy, please come in for a visit.  - Violence - If anyone is threatening or hurting you, please call immediately.

## 2021-07-31 NOTE — Assessment & Plan Note (Signed)
Encourage heart healthy diet and exercise as tolerated Repeat fasting lipids

## 2021-07-31 NOTE — Assessment & Plan Note (Signed)
Repeat chem today; previous serum glucose elevation

## 2021-07-31 NOTE — Assessment & Plan Note (Signed)
Stress and urge, foot to the floor Grade 2 prolapse Encourage referral to GYN- pt refused at this time

## 2021-07-31 NOTE — Assessment & Plan Note (Signed)
Cold intolerance symptoms Recommend repeat labs prior to dose change

## 2021-08-01 ENCOUNTER — Telehealth: Payer: Self-pay | Admitting: Family Medicine

## 2021-08-01 ENCOUNTER — Other Ambulatory Visit: Payer: Self-pay | Admitting: Family Medicine

## 2021-08-01 DIAGNOSIS — E039 Hypothyroidism, unspecified: Secondary | ICD-10-CM

## 2021-08-01 DIAGNOSIS — E7800 Pure hypercholesterolemia, unspecified: Secondary | ICD-10-CM

## 2021-08-01 LAB — TSH+FREE T4
Free T4: 1.44 ng/dL (ref 0.82–1.77)
TSH: 0.284 u[IU]/mL — ABNORMAL LOW (ref 0.450–4.500)

## 2021-08-01 LAB — CBC WITH DIFFERENTIAL/PLATELET
Basophils Absolute: 0.1 10*3/uL (ref 0.0–0.2)
Basos: 1 %
EOS (ABSOLUTE): 0.2 10*3/uL (ref 0.0–0.4)
Eos: 3 %
Hematocrit: 43.4 % (ref 34.0–46.6)
Hemoglobin: 14.4 g/dL (ref 11.1–15.9)
Immature Grans (Abs): 0 10*3/uL (ref 0.0–0.1)
Immature Granulocytes: 0 %
Lymphocytes Absolute: 1.9 10*3/uL (ref 0.7–3.1)
Lymphs: 29 %
MCH: 29.3 pg (ref 26.6–33.0)
MCHC: 33.2 g/dL (ref 31.5–35.7)
MCV: 88 fL (ref 79–97)
Monocytes Absolute: 0.7 10*3/uL (ref 0.1–0.9)
Monocytes: 11 %
Neutrophils Absolute: 3.6 10*3/uL (ref 1.4–7.0)
Neutrophils: 56 %
Platelets: 383 10*3/uL (ref 150–450)
RBC: 4.92 x10E6/uL (ref 3.77–5.28)
RDW: 12.3 % (ref 11.7–15.4)
WBC: 6.5 10*3/uL (ref 3.4–10.8)

## 2021-08-01 LAB — LIPID PANEL
Chol/HDL Ratio: 3 ratio (ref 0.0–4.4)
Cholesterol, Total: 189 mg/dL (ref 100–199)
HDL: 63 mg/dL (ref >39)
LDL Chol Calc (NIH): 106 mg/dL — ABNORMAL HIGH (ref 0–99)
Triglycerides: 112 mg/dL (ref 0–149)
VLDL Cholesterol Cal: 20 mg/dL (ref 5–40)

## 2021-08-01 LAB — COMPREHENSIVE METABOLIC PANEL
ALT: 15 IU/L (ref 0–32)
AST: 16 IU/L (ref 0–40)
Albumin/Globulin Ratio: 1.7 (ref 1.2–2.2)
Albumin: 4.5 g/dL (ref 3.8–4.8)
Alkaline Phosphatase: 107 IU/L (ref 44–121)
BUN/Creatinine Ratio: 22 (ref 12–28)
BUN: 15 mg/dL (ref 8–27)
Bilirubin Total: 0.4 mg/dL (ref 0.0–1.2)
CO2: 23 mmol/L (ref 20–29)
Calcium: 9.4 mg/dL (ref 8.7–10.3)
Chloride: 104 mmol/L (ref 96–106)
Creatinine, Ser: 0.68 mg/dL (ref 0.57–1.00)
Globulin, Total: 2.6 g/dL (ref 1.5–4.5)
Glucose: 87 mg/dL (ref 70–99)
Potassium: 4.5 mmol/L (ref 3.5–5.2)
Sodium: 141 mmol/L (ref 134–144)
Total Protein: 7.1 g/dL (ref 6.0–8.5)
eGFR: 96 mL/min/{1.73_m2} (ref >59)

## 2021-08-01 MED ORDER — ROSUVASTATIN CALCIUM 10 MG PO TABS: 10.0000 mg | ORAL_TABLET | Freq: Every day | ORAL | 3 refills | Status: DC

## 2021-08-01 MED ORDER — LEVOTHYROXINE SODIUM 150 MCG PO TABS: 150.0000 ug | ORAL_TABLET | Freq: Every day | ORAL | 0 refills | Status: DC

## 2021-08-01 MED ORDER — LEVOTHYROXINE SODIUM 25 MCG PO TABS: 12.5000 ug | ORAL_TABLET | Freq: Every day | ORAL | 0 refills | Status: DC

## 2021-08-01 NOTE — Telephone Encounter (Signed)
Lmtcb. PEC please advise of lab results when patient calls back. CRM created.

## 2021-08-01 NOTE — Telephone Encounter (Signed)
Patient returned call and was notified of lab results and PCP recommendations. Patient is agreeable to adding cholesterol medication. Patient states she has been taking her thyroid medication as prescribed/instructed. She is aware of the additional dosage and will take 1/2 tab daily of 25 mcg. Please refill the dosage of thyroid medication she is to continue to take as well. [facility]

## 2021-08-01 NOTE — Telephone Encounter (Signed)
Patient unable to log into her my chart and view her lab results, offered assistance but patient is at work and would like to speak with a nurse regarding her lab results

## 2021-08-01 NOTE — Telephone Encounter (Signed)
Patient called, left VM to return the call for lab results. Patient unable to log into MyChart, no CRM for PEC to release results. Routing to office.   Hi Tammy Bailey,   Control of your thyroid has been difficult. 150 mcg was not enough and 175 mcg is now too much. Before me make further medication changes, I wanted to make sure that you are taking the medication on an empty stomach, with water, 30 minutes prior to any other food or drink. Will you let me know. There isn't another whole tablet- so the next step will be to add 25 mcg every other day or do add 12.5 mcg, a cut tablet, every day.   Cholesterol has improved, although slight. Continue dietary modifications and try to exercise to recommended 150 mins/week. Current risk of heart attack/stroke is elevated.   The 10-year ASCVD risk score (Arnett DK, et al., 2019) is: 10.4% I would recommend adding a cholesterol medication to assist in reducing this risk to <7%. Please respond that you would like to start the cholesterol medication.     Values used to calculate the score:     Age: 66 years     Sex: Female     Is Non-Hispanic African American: No     Diabetic: No     Tobacco smoker: No     Systolic Blood Pressure: 476 mmHg     Is BP treated: Yes     HDL Cholesterol: 63 mg/dL     Total Cholesterol: 189 mg/dL   Blood counts, and blood chemistry are all normal.   Please let us know if you have any questions.   Thank you,   Tammy Joe, FNP  Written by Gwyneth Sprout, FNP on 08/01/2021  8:18 AM EST

## 2021-08-06 ENCOUNTER — Ambulatory Visit: Payer: 59 | Admitting: Dermatology

## 2021-08-14 ENCOUNTER — Ambulatory Visit: Payer: 59 | Admitting: Dermatology

## 2021-08-28 ENCOUNTER — Other Ambulatory Visit: Payer: Self-pay

## 2021-08-28 ENCOUNTER — Ambulatory Visit (INDEPENDENT_AMBULATORY_CARE_PROVIDER_SITE_OTHER): Payer: 59 | Admitting: Family Medicine

## 2021-08-28 VITALS — BP 135/75 | HR 72

## 2021-08-28 NOTE — Progress Notes (Signed)
Patient informed of blood pressure reading.  Advised to continue taking medications and return for 3 month follow up ?

## 2021-09-01 ENCOUNTER — Ambulatory Visit: Payer: 59 | Admitting: Family Medicine

## 2021-09-03 ENCOUNTER — Other Ambulatory Visit: Payer: Self-pay | Admitting: Family Medicine

## 2021-09-03 DIAGNOSIS — E039 Hypothyroidism, unspecified: Secondary | ICD-10-CM

## 2021-09-10 ENCOUNTER — Ambulatory Visit: Payer: Self-pay | Admitting: *Deleted

## 2021-09-10 NOTE — Telephone Encounter (Addendum)
Pt stated PCP advised her at her appointment on 07/31/2021 that she had a slight yeast infection pt stated experiencing vaginal itching and burning from scratching.  ? ?Pt is requesting medication.  ? ?Declined to schedule an appointment.  ? ?Attempted to reach pt, left VM to call back to discuss symptoms. ? ? ? ? ?2nd attempt to reach pt, left Vm to call back. ? ?Unable to reach pt. Routing to practice for PCPs review. ?Please advise. ?

## 2021-09-12 NOTE — Telephone Encounter (Signed)
Patient advised per provider recommendation- she will try OTC yeast treatment- advised call back if symptoms do not improve. ?

## 2021-09-12 NOTE — Telephone Encounter (Signed)
Tried calling patient. Left message to call back. OK for PEC triage to advise.  ?

## 2021-10-16 ENCOUNTER — Ambulatory Visit (INDEPENDENT_AMBULATORY_CARE_PROVIDER_SITE_OTHER): Payer: 59 | Admitting: Family Medicine

## 2021-10-16 ENCOUNTER — Encounter: Payer: Self-pay | Admitting: Family Medicine

## 2021-10-16 VITALS — BP 162/74 | HR 65 | Temp 97.8°F | Resp 16 | Wt 209.0 lb

## 2021-10-16 DIAGNOSIS — M791 Myalgia, unspecified site: Secondary | ICD-10-CM

## 2021-10-16 DIAGNOSIS — T466X5A Adverse effect of antihyperlipidemic and antiarteriosclerotic drugs, initial encounter: Secondary | ICD-10-CM | POA: Diagnosis not present

## 2021-10-16 DIAGNOSIS — E7800 Pure hypercholesterolemia, unspecified: Secondary | ICD-10-CM | POA: Diagnosis not present

## 2021-10-16 DIAGNOSIS — I10 Essential (primary) hypertension: Secondary | ICD-10-CM | POA: Diagnosis not present

## 2021-10-16 DIAGNOSIS — E039 Hypothyroidism, unspecified: Secondary | ICD-10-CM | POA: Diagnosis not present

## 2021-10-16 MED ORDER — LISINOPRIL 40 MG PO TABS: 40.0000 mg | ORAL_TABLET | Freq: Every day | ORAL | 1 refills | Status: DC

## 2021-10-16 MED ORDER — ROSUVASTATIN CALCIUM 10 MG PO TABS: 10.0000 mg | ORAL_TABLET | Freq: Every day | ORAL | 3 refills | Status: DC

## 2021-10-16 MED ORDER — COENZYME Q10 30 MG PO CAPS: 210.0000 mg | ORAL_CAPSULE | Freq: Three times a day (TID) | ORAL | 0 refills | Status: DC

## 2021-10-16 NOTE — Assessment & Plan Note (Signed)
Chronic, previously unstable ?Dose adjusted on levo- repeat TSH and free T4 today, 150 mcg + 1/2 tab of 25 mcg/day ?Pt has lost 2 lbs and continues to have complaints of anxiety r/t home situation  ?

## 2021-10-16 NOTE — Assessment & Plan Note (Signed)
Hold crestor for 2 weeks given complaints of myalgias  ?

## 2021-10-16 NOTE — Patient Instructions (Signed)
The 10-year ASCVD risk score (Arnett DK, et al., 2019) is: 13.5% ?  Values used to calculate the score: ?    Age: 66 years ?    Sex: Female ?    Is Non-Hispanic African American: No ?    Diabetic: No ?    Tobacco smoker: No ?    Systolic Blood Pressure: 149 mmHg ?    Is BP treated: Yes ?    HDL Cholesterol: 63 mg/dL ?    Total Cholesterol: 189 mg/dL ? ?

## 2021-10-16 NOTE — Progress Notes (Signed)
?  ? ? ?I,Roshena L Chambers,acting as a scribe for Gwyneth Sprout, FNP.,have documented all relevant documentation on the behalf of Gwyneth Sprout, FNP,as directed by  Gwyneth Sprout, FNP while in the presence of Gwyneth Sprout, FNP.  ? ?Established patient visit ? ? ?Patient: Tammy Bailey   DOB: 04/23/1956   66 y.o. Female  MRN: [id number] ?Visit Date: 10/16/2021 ? ?Today's healthcare provider: Gwyneth Sprout, FNP  ?Re Introduced to nurse practitioner role and practice setting.  All questions answered.  Discussed provider/patient relationship and expectations. ? ? ?Chief Complaint  ?Patient presents with  ? Leg Pain  ? ?Subjective  ?  ?Leg Pain  ?Incident onset: 2 months ago. There was no injury mechanism. The pain is present in the left leg and right leg. Pertinent negatives include no numbness or tingling. Associated symptoms comments: Muscle cramps, tightness and swelling.   ?Walking helps to relive symptoms. ? ?Medications: ?Outpatient Medications Prior to Visit  ?Medication Sig  ? clobetasol ointment (TEMOVATE) 1.02 % Apply 1 application topically 2 (two) times daily.  ? esomeprazole (NEXIUM) 20 MG packet Take 20 mg by mouth as needed.  ? estradiol (ESTRACE) 1 MG tablet TAKE 1 TABLET BY MOUTH ONCE DAILY  ? levothyroxine (SYNTHROID) 150 MCG tablet TAKE 1 TABLET BY MOUTH ONCE DAILY IN COMBO WITH THE '25MG'$ . REPEAT LABS IN 3 MONTHS  ? levothyroxine (SYNTHROID) 25 MCG tablet Take 0.5 tablets (12.5 mcg total) by mouth daily before breakfast. Two of two thyroid medications: Take in combination with 150 mcg tablet levothyroxine daily. Repeat labs in 3 months.  ? meloxicam (MOBIC) 15 MG tablet Take 1 tablet (15 mg total) by mouth daily.  ? Vitamin D, Ergocalciferol, (DRISDOL) 1.25 MG (50000 UNIT) CAPS capsule TAKE 1 CAPSULE BY MOUTH EVERY 7 DAYS  ? [DISCONTINUED] lisinopril (ZESTRIL) 10 MG tablet Take 1 tablet (10 mg total) by mouth daily.  ? [DISCONTINUED] rosuvastatin (CRESTOR) 10 MG tablet Take 1 tablet (10 mg total)  by mouth daily.  ? ?No facility-administered medications prior to visit.  ? ? ?Review of Systems  ?Constitutional:  Negative for appetite change, chills, fatigue and fever.  ?Respiratory:  Negative for chest tightness and shortness of breath.   ?Cardiovascular:  Positive for leg swelling. Negative for chest pain and palpitations.  ?Gastrointestinal:  Negative for abdominal pain, nausea and vomiting.  ?Musculoskeletal:  Positive for myalgias.  ?Neurological:  Negative for dizziness, tingling, weakness and numbness.  ? ? ?  Objective  ?  ?BP (!) 162/74 (BP Location: Left Arm, Patient Position: Sitting, Cuff Size: Large)   Pulse 65   Temp 97.8 ?F (36.6 ?C) (Oral)   Resp 16   Wt 209 lb (94.8 kg)   SpO2 99%   BMI 42.21 kg/m?  ? ? ?Physical Exam ?Vitals and nursing note reviewed.  ?Constitutional:   ?   General: She is not in acute distress. ?   Appearance: Normal appearance. She is obese. She is not ill-appearing, toxic-appearing or diaphoretic.  ?HENT:  ?   Head: Normocephalic and atraumatic.  ?Cardiovascular:  ?   Rate and Rhythm: Normal rate and regular rhythm.  ?   Pulses: Normal pulses.  ?   Heart sounds: Normal heart sounds. No murmur heard. ?  No friction rub. No gallop.  ?Pulmonary:  ?   Effort: Pulmonary effort is normal. No respiratory distress.  ?   Breath sounds: Normal breath sounds. No stridor. No wheezing, rhonchi or rales.  ?Chest:  ?  Chest wall: No tenderness.  ?Abdominal:  ?   General: Bowel sounds are normal.  ?   Palpations: Abdomen is soft.  ?Musculoskeletal:     ?   General: No swelling, tenderness, deformity or signs of injury. Normal range of motion.  ?   Right lower leg: No edema.  ?   Left lower leg: No edema.  ?   Comments: Bilateral muscle cramps and pain extending from hips down  ?Skin: ?   General: Skin is warm and dry.  ?   Capillary Refill: Capillary refill takes less than 2 seconds.  ?   Coloration: Skin is not jaundiced or pale.  ?   Findings: No bruising, erythema, lesion or  rash.  ?Neurological:  ?   General: No focal deficit present.  ?   Mental Status: She is alert and oriented to person, place, and time. Mental status is at baseline.  ?   Cranial Nerves: No cranial nerve deficit.  ?   Sensory: No sensory deficit.  ?   Motor: No weakness.  ?   Coordination: Coordination normal.  ?Psychiatric:     ?   Mood and Affect: Mood normal.     ?   Behavior: Behavior normal.     ?   Thought Content: Thought content normal.     ?   Judgment: Judgment normal.  ?  ? ?No results found for any visits on 10/16/21. ? Assessment & Plan  ?  ? ?Problem List Items Addressed This Visit   ? ?  ? Cardiovascular and Mediastinum  ? Primary hypertension  ?  Chronic, unstable ?Denies CP ?Denies SOB/ DOE ?Denies low blood pressure/hypotension ?Denies vision changes ?Complaints of headaches; however, under stress at home and at work ?No LE Edema noted on exam ?Continue medication, Lisinopril, increase dose to 40 mg tablet. ?OK to finish 10 mg dose with 4 tablets/day. ?1 Month RTC f/u ?Seek emergent care if you develop chest pain or chest pressure ? ?  ?  ? Relevant Medications  ? lisinopril (ZESTRIL) 40 MG tablet  ? rosuvastatin (CRESTOR) 10 MG tablet  ?  ? Endocrine  ? Adult hypothyroidism  ?  Chronic, previously unstable ?Dose adjusted on levo- repeat TSH and free T4 today, 150 mcg + 1/2 tab of 25 mcg/day ?Pt has lost 2 lbs and continues to have complaints of anxiety r/t home situation  ? ?  ?  ? Relevant Orders  ? TSH + free T4  ?  ? Other  ? Hypercholesterolemia without hypertriglyceridemia  ?  Hold crestor for 2 weeks given complaints of myalgias  ? ?  ?  ? Relevant Medications  ? lisinopril (ZESTRIL) 40 MG tablet  ? rosuvastatin (CRESTOR) 10 MG tablet  ? Myalgia due to statin - Primary  ?  Acute, x2 months ?Statin started 3 months ago ?Will allow 2 week flush out of statin ?Can add 200 mg of CoQ10 3 times/day to assist ?If improved s/p wash out; recommend lower dose or every other day dosing ?If not  improved, refer to vascular/ortho for hip pain extending down legs, bilateral ? ?  ?  ? Relevant Medications  ? co-enzyme Q-10 30 MG capsule  ? ? ? ?Return in about 4 weeks (around 11/13/2021) for HTN management.  ?   ? ?I, [place], have reviewed all documentation for this visit. The documentation on 10/16/21 for the exam, diagnosis, procedures, and orders are all accurate and complete. ? ? ? ?Daneil Dan  Waynetta Pean, FNP  ?[place] ?[phone] (phone) ?[phone] (fax) ? ?[facility]  ?

## 2021-10-16 NOTE — Assessment & Plan Note (Signed)
Acute, x2 months ?Statin started 3 months ago ?Will allow 2 week flush out of statin ?Can add 200 mg of CoQ10 3 times/day to assist ?If improved s/p wash out; recommend lower dose or every other day dosing ?If not improved, refer to vascular/ortho for hip pain extending down legs, bilateral ?

## 2021-10-16 NOTE — Assessment & Plan Note (Signed)
Chronic, unstable ?Denies CP ?Denies SOB/ DOE ?Denies low blood pressure/hypotension ?Denies vision changes ?Complaints of headaches; however, under stress at home and at work ?No LE Edema noted on exam ?Continue medication, Lisinopril, increase dose to 40 mg tablet. ?OK to finish 10 mg dose with 4 tablets/day. ?1 Month RTC f/u ?Seek emergent care if you develop chest pain or chest pressure ? ?

## 2021-10-17 ENCOUNTER — Other Ambulatory Visit: Payer: Self-pay | Admitting: Family Medicine

## 2021-10-17 DIAGNOSIS — E039 Hypothyroidism, unspecified: Secondary | ICD-10-CM

## 2021-10-17 LAB — TSH+FREE T4
Free T4: 1.22 ng/dL (ref 0.82–1.77)
TSH: 1.01 u[IU]/mL (ref 0.450–4.500)

## 2021-10-17 MED ORDER — LEVOTHYROXINE SODIUM 150 MCG PO TABS: ORAL_TABLET | ORAL | 3 refills | Status: DC

## 2021-10-17 MED ORDER — LEVOTHYROXINE SODIUM 25 MCG PO TABS: 12.5000 ug | ORAL_TABLET | Freq: Every day | ORAL | 3 refills | Status: DC

## 2021-10-20 ENCOUNTER — Telehealth: Payer: Self-pay

## 2021-10-20 NOTE — Telephone Encounter (Signed)
Copied from [place] [phone]. Topic: General - Other ?>> Oct 20, 2021  3:47 PM Tessa Lerner A wrote: ?Reason for CRM: The patient would like to speak with a member of clinical staff to review their lab results when possible ?Please contact further when available ?

## 2021-10-21 NOTE — Telephone Encounter (Signed)
Patient advised of lab results. Per patient what is the current dose? ?

## 2021-10-21 NOTE — Telephone Encounter (Signed)
Patient advised.

## 2021-11-18 ENCOUNTER — Encounter: Payer: Self-pay | Admitting: Family Medicine

## 2021-11-18 ENCOUNTER — Ambulatory Visit (INDEPENDENT_AMBULATORY_CARE_PROVIDER_SITE_OTHER): Payer: 59 | Admitting: Family Medicine

## 2021-11-18 NOTE — Progress Notes (Signed)
Patient is here for a BP check: reading of 128/72 today.

## 2021-12-11 ENCOUNTER — Telehealth: Payer: Self-pay | Admitting: *Deleted

## 2021-12-11 NOTE — Telephone Encounter (Signed)
"  I'm a patient of Dr. Amalia Hailey.  I was supposed to have had surgery back when Covid was here.  We weren't able to do it.  If you could, give me a call back.  I have a question about the situation with my foot.  It is getting worse.  I'm having problems with my legs now too."

## 2021-12-12 NOTE — Telephone Encounter (Signed)
I returned her call.  I scheduled her an appointment with Dr. Amalia Hailey on 12/30/2021 at 3:15 pm.

## 2021-12-12 NOTE — Progress Notes (Deleted)
Established patient visit   Patient: Tammy Bailey   DOB: [birth year]   66 y.o. Female  MRN: [id number] Visit Date: 12/18/2021  Today's healthcare provider: Gwyneth Sprout, FNP   No chief complaint on file.  Subjective    HPI  Hypertension, follow-up  BP Readings from Last 3 Encounters:  11/18/21 (!) 151/71  10/16/21 (!) 162/74  08/28/21 135/75   Wt Readings from Last 3 Encounters:  11/18/21 209 lb (94.8 kg)  10/16/21 209 lb (94.8 kg)  07/31/21 211 lb (95.7 kg)     She was last seen for hypertension 2 months ago.  BP at that visit was 162/74. Management since that visit includes increase lisinopril to $RemoveBefor'40mg'[id number]$ .  She reports {excellent/good/fair/poor:19665} compliance with treatment. She {is/is not:9024} having side effects. {document side effects if present:1} She is following a {diet:21022986} diet. She {is/is not:9024} exercising. She {does/does not:200015} smoke.  Use of agents associated with hypertension: {bp agents assoc with hypertension:511::"none"}.   Outside blood pressures are {***enter patient reported home BP readings, or 'not being checked':1}. Symptoms: {Yes/No:20286} chest pain {Yes/No:20286} chest pressure  {Yes/No:20286} palpitations {Yes/No:20286} syncope  {Yes/No:20286} dyspnea {Yes/No:20286} orthopnea  {Yes/No:20286} paroxysmal nocturnal dyspnea {Yes/No:20286} lower extremity edema   Pertinent labs Lab Results  Component Value Date   CHOL 189 07/31/2021   HDL 63 07/31/2021   LDLCALC 106 (H) 07/31/2021   TRIG 112 07/31/2021   CHOLHDL 3.0 07/31/2021   Lab Results  Component Value Date   NA 141 07/31/2021   K 4.5 07/31/2021   CREATININE 0.68 07/31/2021   EGFR 96 07/31/2021   GLUCOSE 87 07/31/2021   TSH 1.010 10/16/2021     The 10-year ASCVD risk score (Arnett DK, et al., 2019) is: 11.8%  ---------------------------------------------------------------------------------------------------   Medications: Outpatient Medications Prior  to Visit  Medication Sig   clobetasol ointment (TEMOVATE) 3.61 % Apply 1 application topically 2 (two) times daily.   co-enzyme Q-10 30 MG capsule Take 7 capsules (210 mg total) by mouth 3 (three) times daily.   esomeprazole (NEXIUM) 20 MG packet Take 20 mg by mouth as needed.   estradiol (ESTRACE) 1 MG tablet TAKE 1 TABLET BY MOUTH ONCE DAILY   levothyroxine (SYNTHROID) 150 MCG tablet TAKE 1 TABLET BY MOUTH ONCE DAILY IN COMBO WITH THE $Remove'25MG'JdSvARF$ .   levothyroxine (SYNTHROID) 25 MCG tablet Take 0.5 tablets (12.5 mcg total) by mouth daily before breakfast. Two of two thyroid medications: Take in combination with 150 mcg tablet levothyroxine daily.   lisinopril (ZESTRIL) 40 MG tablet Take 1 tablet (40 mg total) by mouth daily.   meloxicam (MOBIC) 15 MG tablet Take 1 tablet (15 mg total) by mouth daily.   rosuvastatin (CRESTOR) 10 MG tablet Take 1 tablet (10 mg total) by mouth daily. Hold for 2 weeks to check for leg cramps   Vitamin D, Ergocalciferol, (DRISDOL) 1.25 MG (50000 UNIT) CAPS capsule TAKE 1 CAPSULE BY MOUTH EVERY 7 DAYS   No facility-administered medications prior to visit.    Review of Systems  {Labs  Heme  Chem  Endocrine  Serology  Results Review (optional):23779}   Objective    There were no vitals taken for this visit. {Show previous vital signs (optional):23777}  Physical Exam  ***  No results found for any visits on 12/18/21.  Assessment & Plan     ***  No follow-ups on file.      {provider attestation***:1}   Gwyneth Sprout, [place] [phone] (phone) [phone] (  fax)  [place]

## 2021-12-18 ENCOUNTER — Ambulatory Visit: Payer: 59 | Admitting: Family Medicine

## 2021-12-30 ENCOUNTER — Ambulatory Visit: Payer: 59 | Admitting: Podiatry

## 2021-12-30 ENCOUNTER — Ambulatory Visit (INDEPENDENT_AMBULATORY_CARE_PROVIDER_SITE_OTHER): Payer: 59

## 2021-12-30 DIAGNOSIS — M7662 Achilles tendinitis, left leg: Secondary | ICD-10-CM

## 2021-12-30 NOTE — Progress Notes (Signed)
dgv

## 2021-12-30 NOTE — Progress Notes (Signed)
   HPI: 66 year old female presenting today for follow-up evaluation of chronic painful Achilles tendinitis of the left lower extremity.  Patient was last seen in the office on 12/13/2018.  At that time we were discussing surgery.  Unfortunately the patient has not been in a position to pursue surgery.  Unfortunately she has also had chronic pain and tenderness to the posterior aspect of the heel despite multiple conservative treatment modalities over the past several years.  She presents for further treatment and evaluation   Past Medical History:  Diagnosis Date   Anxiety    Depression    Hyperlipidemia    Vitamin D deficiency    Past Surgical History:  Procedure Laterality Date   ABDOMINAL HYSTERECTOMY     APPENDECTOMY     BLADDER SURGERY     CARPAL TUNNEL RELEASE     Both   CHOLECYSTECTOMY     KNEE SURGERY Left x's two   TONSILLECTOMY     Allergies  Allergen Reactions   Erythromycin       Physical Exam: General: The patient is alert and oriented x3 in no acute distress.  Dermatology: Skin is warm, dry and supple bilateral lower extremities. Negative for open lesions or macerations.  Vascular: Palpable pedal pulses bilaterally. No edema or erythema noted. Capillary refill within normal limits.  Neurological: Epicritic and protective threshold grossly intact bilaterally.   Musculoskeletal Exam: Pain on palpation noted to the posterior tubercle of the left calcaneus at the insertion of the Achilles tendon consistent with retrocalcaneal bursitis. Range of motion within normal limits. Muscle strength 5/5 in all muscle groups bilateral lower extremities.  Radiographic exam LT foot 12/30/2021: Posterior heel spurs noted on lateral view with likely intrasubstance calcifications extending up into the Achilles tendon  MRI Impression LT ankle wo contrast 12/29/2018:  1. Moderate distal Achilles tendinosis with small intrasubstance tear at the insertion. Associated retrocalcaneal  bursitis and reactive marrow edema in the posterior calcaneus.  Assessment: 1. Achilles tendinitis left - recurrent 2. Posterior heel spur left   Plan of Care:  1. Patient was evaluated.  2. Today we discussed the conservative versus surgical management of the presenting pathology. The patient opts for surgical management. All possible complications and details of the procedure were explained. All patient questions were answered. No guarantees were expressed or implied. 3. Authorization for surgery was reinitiated today. Surgery will consist of retrocalcaneal exostectomy left; repair left Achilles.  4. Return to clinic one week post op.   Goes by Tammy Bailey.  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 Edrick Kins, DPM    [address], [place] [address]                Office [phone]  Fax [phone]

## 2022-01-01 ENCOUNTER — Telehealth: Payer: Self-pay | Admitting: Urology

## 2022-01-01 NOTE — Telephone Encounter (Signed)
Received fax for pts surgery consent from [facility] office. I called pt and LM to call back to schedule her sx.

## 2022-01-07 ENCOUNTER — Telehealth: Payer: Self-pay | Admitting: Urology

## 2022-01-07 NOTE — Telephone Encounter (Signed)
DOS - 01/29/22  REPAIR ACHILLES TENDON LEFT --- 60156 CALCANEAL OSTEOTOMY LEFT --- 15379  [employer] EFFECTIVE DATE - 10/14/19  PLAN DEDUCTIBLE - $6,500.00 W/ $6,477.71 REMAINING OUT OF POCKET - $8,500.00 W/ $8,447.71 REMAINING COINSURANCE - 20% COPAY - $0.00   SPOKE WITH JADE D. WITH [employer] AND SHE STATED THAT FOR CPT CODES 43276 AND 14709 NO PRIOR AUTH IS REQUIRED.  REF # [id number]

## 2022-01-28 ENCOUNTER — Other Ambulatory Visit: Payer: Self-pay | Admitting: Family Medicine

## 2022-01-28 DIAGNOSIS — I10 Essential (primary) hypertension: Secondary | ICD-10-CM

## 2022-01-28 DIAGNOSIS — Z780 Asymptomatic menopausal state: Secondary | ICD-10-CM

## 2022-01-29 ENCOUNTER — Other Ambulatory Visit: Payer: Self-pay | Admitting: Podiatry

## 2022-01-29 DIAGNOSIS — M7662 Achilles tendinitis, left leg: Secondary | ICD-10-CM | POA: Diagnosis not present

## 2022-01-29 DIAGNOSIS — M5432 Sciatica, left side: Secondary | ICD-10-CM

## 2022-01-29 DIAGNOSIS — G8918 Other acute postprocedural pain: Secondary | ICD-10-CM | POA: Diagnosis not present

## 2022-01-29 DIAGNOSIS — M7732 Calcaneal spur, left foot: Secondary | ICD-10-CM | POA: Diagnosis not present

## 2022-01-29 MED ORDER — MELOXICAM 15 MG PO TABS: 15.0000 mg | ORAL_TABLET | Freq: Every day | ORAL | 0 refills | Status: DC

## 2022-01-29 MED ORDER — OXYCODONE-ACETAMINOPHEN 5-325 MG PO TABS: 1.0000 | ORAL_TABLET | ORAL | 0 refills | Status: DC | PRN

## 2022-01-29 NOTE — Telephone Encounter (Signed)
Requested medication (s) are due for refill today: yes  Requested medication (s) are on the active medication list: yes    Last refill: 04/10/21  #90  1 refill  Future visit scheduled no  Notes to clinic:Failed, Mammogram due, please review. Thank you.  Requested Prescriptions  Pending Prescriptions Disp Refills   estradiol (ESTRACE) 1 MG tablet [Pharmacy Med Name: ESTRADIOL 1 MG TAB] 90 tablet 1    Sig: TAKE 1 TABLET BY MOUTH ONCE DAILY     OB/GYN:  Estrogens Failed - 01/28/2022  2:10 PM      Failed - Mammogram is up-to-date per Health Maintenance      Failed - Last BP in normal range    BP Readings from Last 1 Encounters:  11/18/21 (!) 151/71         Passed - Valid encounter within last 12 months    Recent Outpatient Visits           3 months ago Myalgia due to statin   [facility] Gwyneth Sprout, FNP   6 months ago Annual physical exam   [facility] Gwyneth Sprout, FNP   8 months ago Sciatica of left side   [facility] Birdie Sons, MD   9 months ago UTI symptoms   [facility] Tally Joe T, FNP   11 months ago Moderate episode of recurrent major depressive disorder [facility])   [facility] Gwyneth Sprout, FNP              Refused Prescriptions Disp Refills   lisinopril (ZESTRIL) 40 MG tablet [Pharmacy Med Name: LISINOPRIL 40 MG TAB] 90 tablet 1    Sig: TAKE 1 TABLET BY MOUTH ONCE DAILY *INCREASED DOSE**     Cardiovascular:  ACE Inhibitors Failed - 01/28/2022  2:10 PM      Failed - Cr in normal range and within 180 days    Creat  Date Value Ref Range Status  05/31/2017 0.58 0.50 - 0.99 mg/dL Final    Comment:    For patients >18 years of age, the reference limit for Creatinine is approximately 13% higher for people identified as African-American. .    Creatinine, Ser  Date Value Ref Range Status  07/31/2021 0.68 0.57 - 1.00 mg/dL Final         Failed - K in normal range and  within 180 days    Potassium  Date Value Ref Range Status  07/31/2021 4.5 3.5 - 5.2 mmol/L Final         Failed - Last BP in normal range    BP Readings from Last 1 Encounters:  11/18/21 (!) 151/71         Passed - Patient is not pregnant      Passed - Valid encounter within last 6 months    Recent Outpatient Visits           3 months ago Myalgia due to statin   [facility] Gwyneth Sprout, FNP   6 months ago Annual physical exam   [facility] Gwyneth Sprout, FNP   8 months ago Sciatica of left side   [facility] Birdie Sons, MD   9 months ago UTI symptoms   [facility] Tally Joe T, FNP   11 months ago Moderate episode of recurrent major depressive disorder [facility])   [facility] Gwyneth Sprout, FNP

## 2022-01-29 NOTE — Telephone Encounter (Signed)
Requested Prescriptions  Pending Prescriptions Disp Refills  . lisinopril (ZESTRIL) 40 MG tablet [Pharmacy Med Name: LISINOPRIL 40 MG TAB] 90 tablet 1    Sig: TAKE 1 TABLET BY MOUTH ONCE DAILY *INCREASED DOSE**     Cardiovascular:  ACE Inhibitors Failed - 01/28/2022  2:10 PM      Failed - Cr in normal range and within 180 days    Creat  Date Value Ref Range Status  05/31/2017 0.58 0.50 - 0.99 mg/dL Final    Comment:    For patients >67 years of age, the reference limit for Creatinine is approximately 13% higher for people identified as African-American. .    Creatinine, Ser  Date Value Ref Range Status  07/31/2021 0.68 0.57 - 1.00 mg/dL Final         Failed - K in normal range and within 180 days    Potassium  Date Value Ref Range Status  07/31/2021 4.5 3.5 - 5.2 mmol/L Final         Failed - Last BP in normal range    BP Readings from Last 1 Encounters:  11/18/21 (!) 151/71         Passed - Patient is not pregnant      Passed - Valid encounter within last 6 months    Recent Outpatient Visits          3 months ago Myalgia due to statin   [facility] Gwyneth Sprout, FNP   6 months ago Annual physical exam   [facility] Tally Joe T, FNP   8 months ago Sciatica of left side   [facility] Birdie Sons, MD   9 months ago UTI symptoms   [facility] Tally Joe T, FNP   11 months ago Moderate episode of recurrent major depressive disorder [facility])   [facility] Tally Joe T, FNP             . estradiol (ESTRACE) 1 MG tablet [Pharmacy Med Name: ESTRADIOL 1 MG TAB] 90 tablet 1    Sig: TAKE 1 TABLET BY MOUTH ONCE DAILY     OB/GYN:  Estrogens Failed - 01/28/2022  2:10 PM      Failed - Mammogram is up-to-date per Health Maintenance      Failed - Last BP in normal range    BP Readings from Last 1 Encounters:  11/18/21 (!) 151/71         Passed - Valid encounter within last 12 months     Recent Outpatient Visits          3 months ago Myalgia due to statin   [facility] Gwyneth Sprout, FNP   6 months ago Annual physical exam   [facility] Gwyneth Sprout, FNP   8 months ago Sciatica of left side   [facility] Birdie Sons, MD   9 months ago UTI symptoms   [facility] Tally Joe T, FNP   11 months ago Moderate episode of recurrent major depressive disorder [facility])   [facility] Gwyneth Sprout, FNP

## 2022-02-06 ENCOUNTER — Ambulatory Visit (INDEPENDENT_AMBULATORY_CARE_PROVIDER_SITE_OTHER): Payer: 59 | Admitting: Podiatry

## 2022-02-06 ENCOUNTER — Ambulatory Visit (INDEPENDENT_AMBULATORY_CARE_PROVIDER_SITE_OTHER): Payer: 59

## 2022-02-06 DIAGNOSIS — Z9889 Other specified postprocedural states: Secondary | ICD-10-CM

## 2022-02-07 NOTE — Progress Notes (Signed)
   Chief Complaint  Patient presents with   Post-op Follow-up    1 DOS 01/29/2022 POSTERIOR HEEL SPUR RESECTION LT, REPAIR OF ACHILLES TENDON LT    Subjective:  Patient presents today status post posterior heel spur resection with repair of Achilles left. DOS: 01/29/2022.  Patient states that she is doing well.  She has no pain associated to the surgical extremity.  She is having a hard time being nonweightbearing but is doing well with the knee scooter.  She presents for further treatment and evaluation  Past Medical History:  Diagnosis Date   Anxiety    Depression    Hyperlipidemia    Vitamin D deficiency     Past Surgical History:  Procedure Laterality Date   ABDOMINAL HYSTERECTOMY     APPENDECTOMY     BLADDER SURGERY     CARPAL TUNNEL RELEASE     Both   CHOLECYSTECTOMY     KNEE SURGERY Left x's two   TONSILLECTOMY      Allergies  Allergen Reactions   Erythromycin     Objective/Physical Exam Neurovascular status intact.  Cast was left intact today.  Capillary refill to the toes immediate.  She is able to wiggle her toes.  No abrasions or irritations coming from the cast  Radiographic Exam:  Osteotomy of the posterior tubercle of the calcaneus noted with excision of the posterior spurs.  Assessment: 1. s/p retrocalcaneal exostectomy with repair of Achilles left. DOS: 01/29/2022   Plan of Care:  1. Patient was evaluated. X-rays reviewed 2.  Cast left intact today 3.  Continue strict nonweightbearing to the surgical extremity using the knee scooter 4.  Return to clinic 3 weeks for cast removal and suture removal   Edrick Kins, DPM [facility]  Dr. Edrick Kins, DPM    [address], [place] [address]                Office [phone]  Fax [phone]

## 2022-02-13 ENCOUNTER — Encounter: Payer: 59 | Admitting: Podiatry

## 2022-02-13 ENCOUNTER — Other Ambulatory Visit: Payer: Self-pay | Admitting: Family Medicine

## 2022-02-13 DIAGNOSIS — Z1211 Encounter for screening for malignant neoplasm of colon: Secondary | ICD-10-CM

## 2022-02-17 ENCOUNTER — Encounter: Payer: 59 | Admitting: Podiatry

## 2022-02-20 ENCOUNTER — Encounter: Payer: 59 | Admitting: Podiatry

## 2022-02-27 ENCOUNTER — Encounter: Payer: 59 | Admitting: Podiatry

## 2022-02-27 ENCOUNTER — Ambulatory Visit (INDEPENDENT_AMBULATORY_CARE_PROVIDER_SITE_OTHER): Payer: 59 | Admitting: Podiatry

## 2022-02-27 ENCOUNTER — Ambulatory Visit: Payer: 59

## 2022-02-27 DIAGNOSIS — Z9889 Other specified postprocedural states: Secondary | ICD-10-CM

## 2022-02-27 DIAGNOSIS — M7662 Achilles tendinitis, left leg: Secondary | ICD-10-CM

## 2022-02-27 NOTE — Progress Notes (Signed)
   Chief Complaint  Patient presents with   Post-op Follow-up    Patient is here for post op visit, patient is here to have cast removed and suture removal.    Subjective:  Patient presents today status post posterior heel spur resection with repair of Achilles left. DOS: 01/29/2022.  Patient continues to do well.  She presents to have her cast removed and sutures removed.  She has been nonweightbearing using the knee scooter with no new complaints at this time  Past Medical History:  Diagnosis Date   Anxiety    Depression    Hyperlipidemia    Vitamin D deficiency     Past Surgical History:  Procedure Laterality Date   ABDOMINAL HYSTERECTOMY     APPENDECTOMY     BLADDER SURGERY     CARPAL TUNNEL RELEASE     Both   CHOLECYSTECTOMY     KNEE SURGERY Left x's two   TONSILLECTOMY      Allergies  Allergen Reactions   Erythromycin     Objective/Physical Exam Neurovascular status intact.  Skin incisions are well coapted with sutures intact.  Well-healing surgical foot  Radiographic Exam LT foot 02/06/2022:  Osteotomy of the posterior tubercle of the calcaneus noted with excision of the posterior spurs.  Assessment: 1. s/p retrocalcaneal exostectomy with repair of Achilles left. DOS: 01/29/2022   Plan of Care:  1. Patient was evaluated.  2.  Cast was bivalved and removed today 3.  Sutures removed 4.  Cam boot with heel lift was provided.  Continue strict nonweightbearing in the knee scooter for an additional 3 weeks 5.  Return to clinic in 3 weeks for follow-up x-ray and to initiate weightbearing and physical therapy   Edrick Kins, DPM [facility]  Dr. Edrick Kins, DPM    [address], [place] [address]                Office [phone]  Fax [phone]

## 2022-03-20 ENCOUNTER — Ambulatory Visit (INDEPENDENT_AMBULATORY_CARE_PROVIDER_SITE_OTHER): Payer: 59 | Admitting: Podiatry

## 2022-03-20 ENCOUNTER — Ambulatory Visit (INDEPENDENT_AMBULATORY_CARE_PROVIDER_SITE_OTHER): Payer: 59

## 2022-03-20 DIAGNOSIS — Z9889 Other specified postprocedural states: Secondary | ICD-10-CM | POA: Diagnosis not present

## 2022-03-20 MED ORDER — OXYCODONE-ACETAMINOPHEN 5-325 MG PO TABS: 1.0000 | ORAL_TABLET | Freq: Four times a day (QID) | ORAL | 0 refills | Status: DC | PRN

## 2022-03-20 NOTE — Progress Notes (Signed)
   Chief Complaint  Patient presents with   Follow-up    Patient is here for follow-up left foot.patient states that she need rx refill for pain medication.    Subjective:  Patient presents today status post posterior heel spur resection with repair of Achilles left. DOS: 01/29/2022.  Patient doing well.  She has been nonweightbearing using the knee scooter.  No new complaints at this time Past Medical History:  Diagnosis Date   Anxiety    Depression    Hyperlipidemia    Vitamin D deficiency     Past Surgical History:  Procedure Laterality Date   ABDOMINAL HYSTERECTOMY     APPENDECTOMY     BLADDER SURGERY     CARPAL TUNNEL RELEASE     Both   CHOLECYSTECTOMY     KNEE SURGERY Left x's two   TONSILLECTOMY      Allergies  Allergen Reactions   Erythromycin     Objective/Physical Exam Neurovascular status intact.  Skin incisions are well coapted with sutures intact.  Well-healing surgical foot  Radiographic Exam LT foot 02/06/2022:  Osteotomy of the posterior tubercle of the calcaneus noted with excision of the posterior spurs.  Assessment: 1. s/p retrocalcaneal exostectomy with repair of Achilles left. DOS: 01/29/2022   Plan of Care:  1. Patient was evaluated.  2.  Patient is about 6 weeks postop.  She may begin weightbearing in the cam walker 3.  Order placed for physical therapy at [facility] 4.  Return to clinic 6 weeks for follow-up x-ray   Edrick Kins, DPM [facility]  Dr. Edrick Kins, DPM    [address], [place] [address]                Office [phone]  Fax [phone]

## 2022-03-31 ENCOUNTER — Telehealth: Payer: Self-pay | Admitting: Podiatry

## 2022-03-31 NOTE — Telephone Encounter (Signed)
Yes.  Please let the patient know it is okay to discontinue the knee scooter.  Thanks, Dr. Amalia Hailey

## 2022-03-31 NOTE — Telephone Encounter (Signed)
Patient wants to know if its ok for her to get rid of the scooter she is walking fine in the boot  ?

## 2022-05-01 ENCOUNTER — Ambulatory Visit (INDEPENDENT_AMBULATORY_CARE_PROVIDER_SITE_OTHER): Payer: 59 | Admitting: Podiatry

## 2022-05-01 ENCOUNTER — Ambulatory Visit (INDEPENDENT_AMBULATORY_CARE_PROVIDER_SITE_OTHER): Payer: 59

## 2022-05-01 DIAGNOSIS — Z9889 Other specified postprocedural states: Secondary | ICD-10-CM

## 2022-05-01 NOTE — Progress Notes (Signed)
   Chief Complaint  Patient presents with   Follow-up    Patient is here for left foot follow-up.she states that healing process has gone well.    Subjective:  Patient presents today status post posterior heel spur resection with repair of Achilles left. DOS: 01/29/2022.  Patient has been doing very well.  She is full WBAT in the cam boot.  She says that she has been doing her home physical therapy and has good motion in her Achilles.  No pain.  She is very satisfied with the surgery so far.  Past Medical History:  Diagnosis Date   Anxiety    Depression    Hyperlipidemia    Vitamin D deficiency     Past Surgical History:  Procedure Laterality Date   ABDOMINAL HYSTERECTOMY     APPENDECTOMY     BLADDER SURGERY     CARPAL TUNNEL RELEASE     Both   CHOLECYSTECTOMY     KNEE SURGERY Left x's two   TONSILLECTOMY      Allergies  Allergen Reactions   Erythromycin     Objective/Physical Exam Neurovascular status intact.  Skin incisions are well coapted with sutures intact.  Well-healing surgical foot.  Full plantarflexion against resistance.  Good muscle strength.  Radiographic Exam LT foot 02/06/2022:  Osteotomy of the posterior tubercle of the calcaneus noted with excision of the posterior spurs.  Assessment: 1. s/p retrocalcaneal exostectomy with repair of Achilles left. DOS: 01/29/2022   Plan of Care:  1. Patient was evaluated.  2.  Patient may now discontinue the cam boot.  Recommend good supportive shoes and sneakers 3.  Continue daily stretching exercises at home 4.  Return to clinic 3 months  Edrick Kins, DPM [facility]  Dr. Edrick Kins, DPM    [address], [place] [address]                Office [phone]  Fax [phone]

## 2022-06-15 HISTORY — PX: ACHILLES TENDON REPAIR: SUR1153

## 2022-08-03 ENCOUNTER — Encounter: Payer: 59 | Admitting: Family Medicine

## 2022-08-04 ENCOUNTER — Encounter: Payer: 59 | Admitting: Family Medicine

## 2022-08-05 NOTE — Progress Notes (Unsigned)
I,Rula Keniston R Cali Hope,acting as a Education administrator for Gwyneth Sprout, FNP.,have documented all relevant documentation on the behalf of Gwyneth Sprout, FNP,as directed by  Gwyneth Sprout, FNP while in the presence of Gwyneth Sprout, FNP.  Complete physical exam  Patient: Tammy Bailey   DOB: [birth year]   67 y.o. Female  MRN: [id number] Visit Date: 08/06/2022  Today's healthcare provider: Gwyneth Sprout, FNP  Re Introduced to nurse practitioner role and practice setting.  All questions answered.  Discussed provider/patient relationship and expectations.  Chief Complaint  Patient presents with   Annual Exam   Subjective    Tammy Bailey is a 67 y.o. female who presents today for a complete physical exam.  She reports consuming a general diet. The patient does not participate in regular exercise at present. She generally feels fairly well. She reports sleeping well. She does not have additional problems to discuss today.  HPI   Past Medical History:  Diagnosis Date   Anxiety    Depression    Hyperlipidemia    Vitamin D deficiency    Past Surgical History:  Procedure Laterality Date   ABDOMINAL HYSTERECTOMY     APPENDECTOMY     BLADDER SURGERY     CARPAL TUNNEL RELEASE     Both   CHOLECYSTECTOMY     KNEE SURGERY Left x's two   TONSILLECTOMY     Social History   Socioeconomic History   Marital status: Divorced    Spouse name: Not on file   Number of children: Not on file   Years of education: Not on file   Highest education level: Not on file  Occupational History   Not on file  Tobacco Use   Smoking status: Never   Smokeless tobacco: Never  Vaping Use   Vaping Use: Never used  Substance and Sexual Activity   Alcohol use: No   Drug use: No   Sexual activity: Not on file  Other Topics Concern   Not on file  Social History Narrative   Not on file   Social Determinants of Health   Financial Resource Strain: Not on file  Food Insecurity: Not on file  Transportation  Needs: Not on file  Physical Activity: Not on file  Stress: Not on file  Social Connections: Not on file  Intimate Partner Violence: Not on file   Family Status  Relation Name Status   Mother  Alive   Father  Alive       MI   Sister 1(yuonger) Alive   Brother  Alive   PGM  Deceased at age 33       Coronary artery disease; lymphoma   Sister 2 Alive   Family History  Problem Relation Age of Onset   Hypertension Father    Diabetes Sister    Allergies  Allergen Reactions   Erythromycin     Patient Care Team: Gwyneth Sprout, FNP as PCP - General (Family Medicine)   Medications: Outpatient Medications Prior to Visit  Medication Sig   esomeprazole (NEXIUM) 20 MG packet Take 20 mg by mouth as needed.   estradiol (ESTRACE) 1 MG tablet TAKE 1 TABLET BY MOUTH ONCE DAILY   levothyroxine (SYNTHROID) 150 MCG tablet TAKE 1 TABLET BY MOUTH ONCE DAILY IN COMBO WITH THE 25MG.   levothyroxine (SYNTHROID) 25 MCG tablet Take 0.5 tablets (12.5 mcg total) by mouth daily before breakfast. Two of two thyroid medications: Take in combination with 150 mcg tablet levothyroxine  daily.   [DISCONTINUED] lisinopril (ZESTRIL) 40 MG tablet Take 1 tablet (40 mg total) by mouth daily.   clobetasol ointment (TEMOVATE) [id number] % Apply 1 application topically 2 (two) times daily. (Patient not taking: Reported on 08/06/2022)   co-enzyme Q-10 30 MG capsule Take 7 capsules (210 mg total) by mouth 3 (three) times daily. (Patient not taking: Reported on 08/06/2022)   meloxicam (MOBIC) 15 MG tablet Take 1 tablet (15 mg total) by mouth daily. (Patient not taking: Reported on 08/06/2022)   oxyCODONE-acetaminophen (PERCOCET) 5-325 MG tablet Take 1 tablet by mouth every 6 (six) hours as needed for severe pain. (Patient not taking: Reported on 08/06/2022)   Vitamin D, Ergocalciferol, (DRISDOL) 1.25 MG (50000 UNIT) CAPS capsule TAKE 1 CAPSULE BY MOUTH EVERY 7 DAYS (Patient not taking: Reported on 08/06/2022)   [DISCONTINUED]  rosuvastatin (CRESTOR) 10 MG tablet Take 1 tablet (10 mg total) by mouth daily. Hold for 2 weeks to check for leg cramps (Patient not taking: Reported on 08/06/2022)   No facility-administered medications prior to visit.    Review of Systems  Constitutional:  Positive for appetite change and fatigue.  Respiratory:  Positive for cough.   All other systems reviewed and are negative.    Objective    BP (!) 152/92 (BP Location: Right Arm, Patient Position: Sitting, Cuff Size: Normal)   Pulse 63   Temp 98 F (36.7 C) (Oral)   Ht 4' 11"$  (1.499 m)   Wt 208 lb 11.2 oz (94.7 kg)   SpO2 100%   BMI 42.15 kg/m    Physical Exam Vitals and nursing note reviewed.  Constitutional:      General: She is awake. She is not in acute distress.    Appearance: Normal appearance. She is well-developed and well-groomed. She is obese. She is not ill-appearing, toxic-appearing or diaphoretic.  HENT:     Head: Normocephalic and atraumatic.     Jaw: There is normal jaw occlusion. No trismus, tenderness, swelling or pain on movement.     Right Ear: Hearing, tympanic membrane, ear canal and external ear normal. There is no impacted cerumen.     Left Ear: Hearing, tympanic membrane, ear canal and external ear normal. There is no impacted cerumen.     Nose: Nose normal. No congestion or rhinorrhea.     Right Turbinates: Not enlarged, swollen or pale.     Left Turbinates: Not enlarged, swollen or pale.     Right Sinus: No maxillary sinus tenderness or frontal sinus tenderness.     Left Sinus: No maxillary sinus tenderness or frontal sinus tenderness.     Mouth/Throat:     Lips: Pink.     Mouth: Mucous membranes are moist. No injury.     Tongue: No lesions.     Pharynx: Oropharynx is clear. Uvula midline. No pharyngeal swelling, oropharyngeal exudate, posterior oropharyngeal erythema or uvula swelling.     Tonsils: No tonsillar exudate or tonsillar abscesses.  Eyes:     General: Lids are normal. Lids are  everted, no foreign bodies appreciated. Vision grossly intact. Gaze aligned appropriately. No allergic shiner or visual field deficit.       Right eye: No discharge.        Left eye: No discharge.     Extraocular Movements: Extraocular movements intact.     Conjunctiva/sclera: Conjunctivae normal.     Right eye: Right conjunctiva is not injected. No exudate.    Left eye: Left conjunctiva is not injected. No exudate.  Pupils: Pupils are equal, round, and reactive to light.  Neck:     Thyroid: No thyroid mass, thyromegaly or thyroid tenderness.     Vascular: No carotid bruit.     Trachea: Trachea normal.  Cardiovascular:     Rate and Rhythm: Normal rate and regular rhythm.     Pulses: Normal pulses.          Carotid pulses are 2+ on the right side and 2+ on the left side.      Radial pulses are 2+ on the right side and 2+ on the left side.       Dorsalis pedis pulses are 2+ on the right side and 2+ on the left side.       Posterior tibial pulses are 2+ on the right side and 2+ on the left side.     Heart sounds: Normal heart sounds, S1 normal and S2 normal. No murmur heard.    No friction rub. No gallop.  Pulmonary:     Effort: Pulmonary effort is normal. No respiratory distress.     Breath sounds: Normal breath sounds and air entry. No stridor. No wheezing, rhonchi or rales.  Chest:     Chest wall: No tenderness.  Abdominal:     General: Abdomen is flat. Bowel sounds are normal. There is no distension.     Palpations: Abdomen is soft. There is no mass.     Tenderness: There is no abdominal tenderness. There is no right CVA tenderness, left CVA tenderness, guarding or rebound.     Hernia: No hernia is present.  Genitourinary:    Comments: Exam deferred; denies complaints Musculoskeletal:        General: No swelling, tenderness, deformity or signs of injury. Normal range of motion.     Cervical back: Full passive range of motion without pain, normal range of motion and neck supple.  No edema, rigidity or tenderness. No muscular tenderness.     Right lower leg: No edema.     Left lower leg: No edema.  Lymphadenopathy:     Cervical: No cervical adenopathy.     Right cervical: No superficial, deep or posterior cervical adenopathy.    Left cervical: No superficial, deep or posterior cervical adenopathy.  Skin:    General: Skin is warm and dry.     Capillary Refill: Capillary refill takes less than 2 seconds.     Coloration: Skin is not jaundiced or pale.     Findings: No bruising, erythema, lesion or rash.  Neurological:     General: No focal deficit present.     Mental Status: She is alert and oriented to person, place, and time. Mental status is at baseline.     GCS: GCS eye subscore is 4. GCS verbal subscore is 5. GCS motor subscore is 6.     Cranial Nerves: No cranial nerve deficit.     Sensory: Sensation is intact. No sensory deficit.     Motor: Motor function is intact. No weakness.     Coordination: Coordination is intact. Coordination normal.     Gait: Gait is intact. Gait normal.  Psychiatric:        Attention and Perception: Attention and perception normal.        Mood and Affect: Mood and affect normal.        Speech: Speech normal.        Behavior: Behavior normal. Behavior is cooperative.        Thought Content: Thought content normal.  Cognition and Memory: Cognition and memory normal.        Judgment: Judgment normal.     Last depression screening scores    08/06/2022    9:38 AM 07/31/2021    9:33 AM 05/20/2021    3:33 PM  PHQ 2/9 Scores  PHQ - 2 Score 4 6 5  $ PHQ- 9 Score 11 18 12   $ Last fall risk screening    08/06/2022    9:38 AM  [place] in the past year? 0  Number falls in past yr: 0  Injury with Fall? 0   Last Audit-C alcohol use screening    08/06/2022    9:39 AM  Alcohol Use Disorder Test (AUDIT)  1. How often do you have a drink containing alcohol? 0  2. How many drinks containing alcohol do you have on a  typical day when you are drinking? 0  3. How often do you have six or more drinks on one occasion? 0  AUDIT-C Score 0   A score of 3 or more in women, and 4 or more in men indicates increased risk for alcohol abuse, EXCEPT if all of the points are from question 1   No results found for any visits on 08/06/22.  Assessment & Plan    Routine Health Maintenance and Physical Exam  Exercise Activities and Dietary recommendations  Goals   None     Immunization History  Administered Date(s) Administered   Fluad Quad(high Dose 65+) 07/22/2020, 04/14/2021   Influenza Inj Mdck Quad With Preservative 04/11/2018   Influenza Split 08/27/2009   Influenza,inj,Quad PF,6+ Mos 03/30/2014, 04/05/2015, 04/07/2016, 05/31/2017   Influenza-Unspecified 04/06/2019   PFIZER(Purple Top)SARS-COV-2 Vaccination 08/18/2019, 09/08/2019, 03/19/2020   Pneumococcal Polysaccharide-23 07/22/2020   Tdap 08/27/2009   Zoster, Live 05/19/2013    Health Maintenance  Topic Date Due   Zoster Vaccines- Shingrix (1 of 2) Never done   MAMMOGRAM  04/11/2015   DTaP/Tdap/Td (2 - Td or Tdap) 08/28/2019   DEXA SCAN  Never done   Pneumonia Vaccine 43+ Years old (2 of 2 - PCV) 07/22/2021   Fecal DNA (Cologuard)  08/03/2021   COVID-19 Vaccine (4 - 2023-24 season) 02/13/2022   INFLUENZA VACCINE  09/13/2022 (Originally 01/13/2022)   Hepatitis C Screening  Completed   HPV VACCINES  Aged Out    Discussed health benefits of physical activity, and encouraged her to engage in regular exercise appropriate for her age and condition.  Problem List Items Addressed This Visit       Cardiovascular and Mediastinum   Primary hypertension    Chronic, uncontrolled Recommend additional Norvasc 5 mg to assist Lisinopril 40 mg Goal <140/<90 1 month f/u recommended      Relevant Medications   amLODipine (NORVASC) 5 MG tablet   lisinopril (ZESTRIL) 40 MG tablet   rosuvastatin (CRESTOR) 10 MG tablet     Other   Annual physical exam -  Primary    Encourage annual vision and dental visits Due for mammogram and DEXA Has cologuard at home; has not completed. Encouraged to check date and complete if not out of date Things to do to keep yourself healthy  - Exercise at least 30-45 minutes a day, 3-4 days a week.  - Eat a low-fat diet with lots of fruits and vegetables, up to 7-9 servings per day.  - Seatbelts can save your life. Wear them always.  - Smoke detectors on every level of your home, check batteries every year.  -  Eye Doctor - have an eye exam every 1-2 years  - Safe sex - if you may be exposed to STDs, use a condom.  - Alcohol -  If you drink, do it moderately, less than 2 drinks per day.  - [place]. Choose someone to speak for you if you are not able.  - Depression is common in our stressful world.If you're feeling down or losing interest in things you normally enjoy, please come in for a visit.  - Violence - If anyone is threatening or hurting you, please call immediately.       Relevant Orders   Basic Metabolic Panel (BMET)   CBC   Lipid panel   TSH   Encounter for screening mammogram for malignant neoplasm of breast    Due for screening for mammogram, denies breast concerns, provided with phone number to call and schedule appointment for mammogram. Encouraged to repeat breast cancer screening every 1-2 years.  Please call and schedule your mammogram:  [facility]  [place], [place],  [place]  [id number] Get Driving Directions Main: [phone]  Sunday:Closed Monday:7:20 AM - 5:00 PM Tuesday:7:20 AM - 5:00 PM Wednesday:7:20 AM - 5:00 PM Thursday:7:20 AM - 5:00 PM Friday:7:20 AM - 4:30 PM Saturday:Closed       Relevant Orders   MM 3D SCREEN BREAST BILATERAL   Hypercholesterolemia without hypertriglyceridemia    Chronic, stable Continue Crestor  Repeat LP LDL <100      Relevant Medications    amLODipine (NORVASC) 5 MG tablet   lisinopril (ZESTRIL) 40 MG tablet   rosuvastatin (CRESTOR) 10 MG tablet   Post-menopausal    Recommend DEXA scan       Relevant Orders   DG Bone Density   RESOLVED: Statin declined   Return in about 4 weeks (around 09/03/2022) for HTN management.    Vonna Kotyk, FNP, have reviewed all documentation for this visit. The documentation on 08/06/22 for the exam, diagnosis, procedures, and orders are all accurate and complete.  Gwyneth Sprout, [place] [phone] (phone) [phone] (fax)  [place]

## 2022-08-06 ENCOUNTER — Ambulatory Visit (INDEPENDENT_AMBULATORY_CARE_PROVIDER_SITE_OTHER): Payer: 59 | Admitting: Family Medicine

## 2022-08-06 ENCOUNTER — Encounter: Payer: Self-pay | Admitting: Family Medicine

## 2022-08-06 VITALS — BP 152/92 | HR 63 | Temp 98.0°F | Ht 59.0 in | Wt 208.7 lb

## 2022-08-06 DIAGNOSIS — Z780 Asymptomatic menopausal state: Secondary | ICD-10-CM | POA: Diagnosis not present

## 2022-08-06 DIAGNOSIS — Z5320 Procedure and treatment not carried out because of patient's decision for unspecified reasons: Secondary | ICD-10-CM

## 2022-08-06 DIAGNOSIS — Z1231 Encounter for screening mammogram for malignant neoplasm of breast: Secondary | ICD-10-CM

## 2022-08-06 DIAGNOSIS — E7800 Pure hypercholesterolemia, unspecified: Secondary | ICD-10-CM | POA: Diagnosis not present

## 2022-08-06 DIAGNOSIS — I10 Essential (primary) hypertension: Secondary | ICD-10-CM

## 2022-08-06 DIAGNOSIS — Z0000 Encounter for general adult medical examination without abnormal findings: Secondary | ICD-10-CM

## 2022-08-06 MED ORDER — AMLODIPINE BESYLATE 5 MG PO TABS: 5.0000 mg | ORAL_TABLET | Freq: Every day | ORAL | 3 refills | Status: DC

## 2022-08-06 MED ORDER — LISINOPRIL 40 MG PO TABS: 40.0000 mg | ORAL_TABLET | Freq: Every day | ORAL | 3 refills | Status: DC

## 2022-08-06 MED ORDER — ROSUVASTATIN CALCIUM 10 MG PO TABS: 10.0000 mg | ORAL_TABLET | Freq: Every day | ORAL | 3 refills | Status: DC

## 2022-08-06 NOTE — Assessment & Plan Note (Addendum)
Chronic, stable Continue Crestor  Repeat LP LDL <100

## 2022-08-06 NOTE — Patient Instructions (Signed)
Please call and schedule your mammogram and bone density screening.  [facility]  [address], [place],  [place]  [phone] Get Driving Directions Main: [phone]  Sunday:Closed Monday:7:20 AM - 5:00 PM Tuesday:7:20 AM - 5:00 PM Wednesday:7:20 AM - 5:00 PM Thursday:7:20 AM - 5:00 PM Friday:7:20 AM - 4:30 PM Saturday:Closed

## 2022-08-06 NOTE — Assessment & Plan Note (Signed)
Encourage annual vision and dental visits Due for mammogram and DEXA Has cologuard at home; has not completed. Encouraged to check date and complete if not out of date Things to do to keep yourself healthy  - Exercise at least 30-45 minutes a day, 3-4 days a week.  - Eat a low-fat diet with lots of fruits and vegetables, up to 7-9 servings per day.  - Seatbelts can save your life. Wear them always.  - Smoke detectors on every level of your home, check batteries every year.  - Eye Doctor - have an eye exam every 1-2 years  - Safe sex - if you may be exposed to STDs, use a condom.  - Alcohol -  If you drink, do it moderately, less than 2 drinks per day.  - [place]. Choose someone to speak for you if you are not able.  - Depression is common in our stressful world.If you're feeling down or losing interest in things you normally enjoy, please come in for a visit.  - Violence - If anyone is threatening or hurting you, please call immediately.

## 2022-08-06 NOTE — Assessment & Plan Note (Signed)
Chronic, uncontrolled Recommend additional Norvasc 5 mg to assist Lisinopril 40 mg Goal <140/<90 1 month f/u recommended

## 2022-08-06 NOTE — Assessment & Plan Note (Signed)
Recommend DEXA scan

## 2022-08-06 NOTE — Assessment & Plan Note (Signed)
Due for screening for mammogram, denies breast concerns, provided with phone number to call and schedule appointment for mammogram. Encouraged to repeat breast cancer screening every 1-2 years.  Please call and schedule your mammogram:  [facility]  [place], [place],  [place]  [address] Get Driving Directions Main: [phone]  Sunday:Closed Monday:7:20 AM - 5:00 PM Tuesday:7:20 AM - 5:00 PM Wednesday:7:20 AM - 5:00 PM Thursday:7:20 AM - 5:00 PM Friday:7:20 AM - 4:30 PM Saturday:Closed

## 2022-08-07 ENCOUNTER — Other Ambulatory Visit: Payer: Self-pay

## 2022-08-07 ENCOUNTER — Other Ambulatory Visit: Payer: Self-pay | Admitting: Family Medicine

## 2022-08-07 DIAGNOSIS — Z780 Asymptomatic menopausal state: Secondary | ICD-10-CM

## 2022-08-07 LAB — CBC
Hematocrit: 41.1 % (ref 34.0–46.6)
Hemoglobin: 14 g/dL (ref 11.1–15.9)
MCH: 30.3 pg (ref 26.6–33.0)
MCHC: 34.1 g/dL (ref 31.5–35.7)
MCV: 89 fL (ref 79–97)
Platelets: 367 10*3/uL (ref 150–450)
RBC: 4.62 x10E6/uL (ref 3.77–5.28)
RDW: 12 % (ref 11.7–15.4)
WBC: 6.3 10*3/uL (ref 3.4–10.8)

## 2022-08-07 LAB — BASIC METABOLIC PANEL
BUN/Creatinine Ratio: 19 (ref 12–28)
BUN: 15 mg/dL (ref 8–27)
CO2: 21 mmol/L (ref 20–29)
Calcium: 9.5 mg/dL (ref 8.7–10.3)
Chloride: 104 mmol/L (ref 96–106)
Creatinine, Ser: 0.77 mg/dL (ref 0.57–1.00)
Glucose: 93 mg/dL (ref 70–99)
Potassium: 5.1 mmol/L (ref 3.5–5.2)
Sodium: 142 mmol/L (ref 134–144)
eGFR: 84 mL/min/{1.73_m2} (ref >59)

## 2022-08-07 LAB — LIPID PANEL
Chol/HDL Ratio: 3.2 ratio (ref 0.0–4.4)
Cholesterol, Total: 210 mg/dL — ABNORMAL HIGH (ref 100–199)
HDL: 66 mg/dL (ref >39)
LDL Chol Calc (NIH): 130 mg/dL — ABNORMAL HIGH (ref 0–99)
Triglycerides: 79 mg/dL (ref 0–149)
VLDL Cholesterol Cal: 14 mg/dL (ref 5–40)

## 2022-08-07 LAB — TSH: TSH: 18.4 u[IU]/mL — ABNORMAL HIGH (ref 0.450–4.500)

## 2022-08-07 MED ORDER — LEVOTHYROXINE SODIUM 175 MCG PO TABS: 175.0000 ug | ORAL_TABLET | Freq: Every day | ORAL | 0 refills | Status: DC

## 2022-08-07 MED ORDER — ESTRADIOL 1 MG PO TABS: 1.0000 mg | ORAL_TABLET | Freq: Every day | ORAL | 1 refills | Status: DC

## 2022-08-07 NOTE — Progress Notes (Signed)
Hi Blanch Media,  Good to see you yesterday.  Cholesterol is increased; total is now >200 and LDL/bad cholesterol is increased by 24. The 10-year ASCVD risk score (Arnett DK, et al., 2019) is: 12.3%. Ensure you are taking Crestor 10 mg daily.    Values used to calculate the score:     Age: 67 years     Sex: Female     Is Non-Hispanic African American: No     Diabetic: No     Tobacco smoker: No     Systolic Blood Pressure: 0000000 mmHg     Is BP treated: Yes     HDL Cholesterol: 66 mg/dL     Total Cholesterol: 210 mg/dL  Thyroid is under corrected; continue to recommend higher dose. If we had prescribed 150 and 25 (taking 1/2). I would recommend you continue both. I can send in new single Rx to provide ease. Recommend labs following change in dose.  Normal cell count  Please let us know if you have any questions.  Thank you, Gwyneth Sprout, [address] [place], [place] [address] [phone] (phone) [phone] (fax) [place]

## 2022-09-01 NOTE — Progress Notes (Unsigned)
I,J'ya E Hunter,acting as a scribe for Gwyneth Sprout, FNP.,have documented all relevant documentation on the behalf of Gwyneth Sprout, FNP,as directed by  Gwyneth Sprout, FNP while in the presence of Gwyneth Sprout, FNP.   Established patient visit   Patient: Tammy Bailey   DOB: [birth year]   67 y.o. Female  MRN: [id number] Visit Date: 09/02/2022  Today's healthcare provider: Gwyneth Sprout, FNP   No chief complaint on file.  Subjective    HPI  Hypertension, follow-up  BP Readings from Last 3 Encounters:  08/06/22 (!) 152/92  11/18/21 (!) 151/71  10/16/21 (!) 162/74   Wt Readings from Last 3 Encounters:  08/06/22 208 lb 11.2 oz (94.7 kg)  11/18/21 209 lb (94.8 kg)  10/16/21 209 lb (94.8 kg)     She was last seen for hypertension 1 months ago.  BP at that visit was 152/92. Management since that visit includes Recommend additional Norvasc 5 mg to assist Lisinopril 40 mg .  She reports {excellent/good/fair/poor:19665} compliance with treatment. She {is/is not:9024} having side effects. {document side effects if present:1}  Outside blood pressures are {***enter patient reported home BP readings, or 'not being checked':1}. Symptoms: {Yes/No:20286} chest pain {Yes/No:20286} chest pressure  {Yes/No:20286} palpitations {Yes/No:20286} syncope  {Yes/No:20286} dyspnea {Yes/No:20286} orthopnea  {Yes/No:20286} paroxysmal nocturnal dyspnea {Yes/No:20286} lower extremity edema   Pertinent labs Lab Results  Component Value Date   CHOL 210 (H) 08/06/2022   HDL 66 08/06/2022   LDLCALC 130 (H) 08/06/2022   TRIG 79 08/06/2022   CHOLHDL 3.2 08/06/2022   Lab Results  Component Value Date   NA 142 08/06/2022   K 5.1 08/06/2022   CREATININE 0.77 08/06/2022   EGFR 84 08/06/2022   GLUCOSE 93 08/06/2022   TSH 18.400 (H) 08/06/2022     The 10-year ASCVD risk score (Arnett DK, et al., 2019) is:  13.7%  ---------------------------------------------------------------------------------------------------   Medications: Outpatient Medications Prior to Visit  Medication Sig   amLODipine (NORVASC) 5 MG tablet Take 1 tablet (5 mg total) by mouth daily.   clobetasol ointment (TEMOVATE) [id number] % Apply 1 application topically 2 (two) times daily. (Patient not taking: Reported on 08/06/2022)   co-enzyme Q-10 30 MG capsule Take 7 capsules (210 mg total) by mouth 3 (three) times daily. (Patient not taking: Reported on 08/06/2022)   esomeprazole (NEXIUM) 20 MG packet Take 20 mg by mouth as needed.   estradiol (ESTRACE) 1 MG tablet Take 1 tablet (1 mg total) by mouth daily.   levothyroxine (SYNTHROID) 175 MCG tablet Take 1 tablet (175 mcg total) by mouth daily.   lisinopril (ZESTRIL) 40 MG tablet Take 1 tablet (40 mg total) by mouth daily.   meloxicam (MOBIC) 15 MG tablet Take 1 tablet (15 mg total) by mouth daily. (Patient not taking: Reported on 08/06/2022)   oxyCODONE-acetaminophen (PERCOCET) 5-325 MG tablet Take 1 tablet by mouth every 6 (six) hours as needed for severe pain. (Patient not taking: Reported on 08/06/2022)   rosuvastatin (CRESTOR) 10 MG tablet Take 1 tablet (10 mg total) by mouth daily.   Vitamin D, Ergocalciferol, (DRISDOL) 1.25 MG (50000 UNIT) CAPS capsule TAKE 1 CAPSULE BY MOUTH EVERY 7 DAYS (Patient not taking: Reported on 08/06/2022)   No facility-administered medications prior to visit.    Review of Systems  {Labs  Heme  Chem  Endocrine  Serology  Results Review (optional):23779}   Objective    There were no vitals taken for this visit. {Show previous vital  signs (optional):23777}  Physical Exam  ***  No results found for any visits on 09/02/22.  Assessment & Plan     ***  No follow-ups on file.      {provider attestation***:1}   Gwyneth Sprout, [place] [phone] (phone) [phone] (fax)  [place]

## 2022-09-02 ENCOUNTER — Ambulatory Visit (INDEPENDENT_AMBULATORY_CARE_PROVIDER_SITE_OTHER): Payer: 59 | Admitting: Family Medicine

## 2022-09-02 DIAGNOSIS — Z91199 Patient's noncompliance with other medical treatment and regimen due to unspecified reason: Secondary | ICD-10-CM

## 2022-09-16 ENCOUNTER — Ambulatory Visit: Payer: 59 | Admitting: Family Medicine

## 2022-11-25 ENCOUNTER — Ambulatory Visit: Payer: 59 | Admitting: Family Medicine

## 2022-11-25 ENCOUNTER — Encounter: Payer: Self-pay | Admitting: Family Medicine

## 2022-11-25 VITALS — BP 150/64 | HR 61 | Temp 98.1°F | Resp 16 | Wt 208.9 lb

## 2022-11-25 DIAGNOSIS — T466X5A Adverse effect of antihyperlipidemic and antiarteriosclerotic drugs, initial encounter: Secondary | ICD-10-CM | POA: Diagnosis not present

## 2022-11-25 DIAGNOSIS — M79605 Pain in left leg: Secondary | ICD-10-CM

## 2022-11-25 DIAGNOSIS — M79604 Pain in right leg: Secondary | ICD-10-CM | POA: Insufficient documentation

## 2022-11-25 DIAGNOSIS — I10 Essential (primary) hypertension: Secondary | ICD-10-CM

## 2022-11-25 DIAGNOSIS — T464X5A Adverse effect of angiotensin-converting-enzyme inhibitors, initial encounter: Secondary | ICD-10-CM | POA: Diagnosis not present

## 2022-11-25 DIAGNOSIS — M791 Myalgia, unspecified site: Secondary | ICD-10-CM | POA: Diagnosis not present

## 2022-11-25 DIAGNOSIS — E039 Hypothyroidism, unspecified: Secondary | ICD-10-CM

## 2022-11-25 DIAGNOSIS — R058 Other specified cough: Secondary | ICD-10-CM | POA: Diagnosis not present

## 2022-11-25 MED ORDER — EZETIMIBE 10 MG PO TABS: 10.0000 mg | ORAL_TABLET | Freq: Every day | ORAL | 3 refills | Status: DC

## 2022-11-25 MED ORDER — GABAPENTIN 300 MG PO CAPS: 300.0000 mg | ORAL_CAPSULE | Freq: Every day | ORAL | 2 refills | Status: DC

## 2022-11-25 MED ORDER — VALSARTAN-HYDROCHLOROTHIAZIDE 160-25 MG PO TABS: 1.0000 | ORAL_TABLET | Freq: Every day | ORAL | 3 refills | Status: DC

## 2022-11-25 NOTE — Assessment & Plan Note (Signed)
Chronic, elevated Also endorses cough thought to be s/s ACEi; dry in nature, annoying, non productive. No hx of asthma or COPD Will switch today to ARB and add HCTZ given ongoing LE leg pain following dependent position at work during day.

## 2022-11-25 NOTE — Assessment & Plan Note (Signed)
Chronic, worse after work/end of day Repeat BMP Add hctz to assist with dependent edema/not pitting Trial of gabapentin to assist as well 1 month f/u recommended

## 2022-11-25 NOTE — Assessment & Plan Note (Signed)
Recommend zetia to assist healthy diet and exercise; likely wash out is complete; use of gabapentin nightly to assist with BLE aches

## 2022-11-25 NOTE — Progress Notes (Signed)
I,Sulibeya S Dimas,acting as a Neurosurgeon for Jacky Kindle, FNP.,have documented all relevant documentation on the behalf of Jacky Kindle, FNP,as directed by  Jacky Kindle, FNP while in the presence of Jacky Kindle, FNP.   Established patient visit  Patient: Tammy Bailey   DOB: Jan 12, 1956   67 y.o. Female  MRN: [id number] Visit Date: 11/25/2022  Today's healthcare provider: Jacky Kindle, FNP  Re Introduced to nurse practitioner role and practice setting.  All questions answered.  Discussed provider/patient relationship and expectations.  Chief Complaint  Patient presents with   Cough   Subjective    HPI  Patient C/O persistent dry cough x one year.   Patient C/O swelling on her lower legs. She reports legs feel very "heavy." She report symptoms have been recurrent.   Medications: Outpatient Medications Prior to Visit  Medication Sig   amLODipine (NORVASC) 5 MG tablet Take 1 tablet (5 mg total) by mouth daily.   esomeprazole (NEXIUM) 20 MG packet Take 20 mg by mouth as needed.   estradiol (ESTRACE) 1 MG tablet Take 1 tablet (1 mg total) by mouth daily.   levothyroxine (SYNTHROID) 175 MCG tablet Take 1 tablet (175 mcg total) by mouth daily.   [DISCONTINUED] lisinopril (ZESTRIL) 40 MG tablet Take 1 tablet (40 mg total) by mouth daily.   co-enzyme Q-10 30 MG capsule Take 7 capsules (210 mg total) by mouth 3 (three) times daily. (Patient not taking: Reported on 08/06/2022)   [DISCONTINUED] clobetasol ointment (TEMOVATE) 0.05 % Apply 1 application topically 2 (two) times daily. (Patient not taking: Reported on 08/06/2022)   [DISCONTINUED] meloxicam (MOBIC) 15 MG tablet Take 1 tablet (15 mg total) by mouth daily. (Patient not taking: Reported on 08/06/2022)   [DISCONTINUED] oxyCODONE-acetaminophen (PERCOCET) 5-325 MG tablet Take 1 tablet by mouth every 6 (six) hours as needed for severe pain. (Patient not taking: Reported on 08/06/2022)   [DISCONTINUED] rosuvastatin (CRESTOR) 10 MG tablet  Take 1 tablet (10 mg total) by mouth daily.   [DISCONTINUED] Vitamin D, Ergocalciferol, (DRISDOL) 1.25 MG (50000 UNIT) CAPS capsule TAKE 1 CAPSULE BY MOUTH EVERY 7 DAYS (Patient not taking: Reported on 08/06/2022)   No facility-administered medications prior to visit.    Review of Systems  Constitutional:  Positive for fatigue. Negative for chills and fever.  Respiratory:  Positive for cough. Negative for chest tightness, shortness of breath and wheezing.   Cardiovascular:  Positive for leg swelling. Negative for chest pain and palpitations.  Gastrointestinal:  Negative for abdominal pain, nausea and vomiting.  Musculoskeletal:  Positive for myalgias. Negative for back pain and joint swelling.  Skin:  Negative for color change and rash.  Neurological:  Positive for light-headedness and headaches. Negative for dizziness, tremors, weakness and numbness.   Last metabolic panel Lab Results  Component Value Date   GLUCOSE 93 08/06/2022   NA 142 08/06/2022   K 5.1 08/06/2022   CL 104 08/06/2022   CO2 21 08/06/2022   BUN 15 08/06/2022   CREATININE 0.77 08/06/2022   EGFR 84 08/06/2022   CALCIUM 9.5 08/06/2022   PROT 7.1 07/31/2021   ALBUMIN 4.5 07/31/2021   LABGLOB 2.6 07/31/2021   AGRATIO 1.7 07/31/2021   BILITOT 0.4 07/31/2021   ALKPHOS 107 07/31/2021   AST 16 07/31/2021   ALT 15 07/31/2021   Last hemoglobin A1c Lab Results  Component Value Date   HGBA1C 5.4 02/21/2021       Objective    BP (!) 150/64 (BP  Location: Left Arm, Patient Position: Sitting, Cuff Size: Normal)   Pulse 61   Temp 98.1 F (36.7 C) (Temporal)   Resp 16   Wt 208 lb 14.4 oz (94.8 kg)   SpO2 98%   BMI 42.19 kg/m  BP Readings from Last 3 Encounters:  11/25/22 (!) 150/64  08/06/22 (!) 152/92  11/18/21 (!) 151/71   Wt Readings from Last 3 Encounters:  11/25/22 208 lb 14.4 oz (94.8 kg)  08/06/22 208 lb 11.2 oz (94.7 kg)  11/18/21 209 lb (94.8 kg)      Physical Exam Vitals and nursing note  reviewed.  Constitutional:      General: She is not in acute distress.    Appearance: Normal appearance. She is obese. She is not ill-appearing, toxic-appearing or diaphoretic.  HENT:     Head: Normocephalic and atraumatic.  Cardiovascular:     Rate and Rhythm: Normal rate and regular rhythm.     Pulses: Normal pulses.     Heart sounds: Normal heart sounds. No murmur heard.    No friction rub. No gallop.  Pulmonary:     Effort: Pulmonary effort is normal. No respiratory distress.     Breath sounds: Normal breath sounds. No stridor. No wheezing, rhonchi or rales.  Chest:     Chest wall: No tenderness.  Musculoskeletal:        General: No swelling, tenderness, deformity or signs of injury. Normal range of motion.     Right lower leg: No edema.     Left lower leg: No edema.  Skin:    General: Skin is warm and dry.     Capillary Refill: Capillary refill takes less than 2 seconds.     Coloration: Skin is not jaundiced or pale.     Findings: No bruising, erythema, lesion or rash.  Neurological:     General: No focal deficit present.     Mental Status: She is alert and oriented to person, place, and time. Mental status is at baseline.     Cranial Nerves: No cranial nerve deficit.     Sensory: No sensory deficit.     Motor: No weakness.     Coordination: Coordination normal.  Psychiatric:        Mood and Affect: Mood normal.        Behavior: Behavior normal.        Thought Content: Thought content normal.        Judgment: Judgment normal.    No results found for any visits on 11/25/22.  Assessment & Plan     Problem List Items Addressed This Visit       Cardiovascular and Mediastinum   Primary hypertension - Primary    Chronic, elevated Also endorses cough thought to be s/s ACEi; dry in nature, annoying, non productive. No hx of asthma or COPD Will switch today to ARB and add HCTZ given ongoing LE leg pain following dependent position at work during day.        Relevant  Medications   valsartan-hydrochlorothiazide (DIOVAN-HCT) 160-25 MG tablet   ezetimibe (ZETIA) 10 MG tablet   Other Relevant Orders   Basic Metabolic Panel (BMET)     Endocrine   Adult hypothyroidism    Chronic, previously overstimulated No symptoms at this time Repeat TSH previously on 175 mcg synthroid       Relevant Orders   TSH     Other   Cough due to ACE inhibitor    Switch to diovan with hctz  today      Morbid obesity (HCC)    Chronic, unchanged Body mass index is 42.19 kg/m. Continue to recommend balanced, lower carb meals. Smaller meal size, adding snacks. Choosing water as drink of choice and increasing purposeful exercise.       Myalgia due to statin    Recommend zetia to assist healthy diet and exercise; likely wash out is complete; use of gabapentin nightly to assist with BLE aches      Pain in both lower extremities    Chronic, worse after work/end of day Repeat BMP Add hctz to assist with dependent edema/not pitting Trial of gabapentin to assist as well 1 month f/u recommended       Return in about 4 weeks (around 12/23/2022) for HTN management.     Leilani Merl, FNP, have reviewed all documentation for this visit. The documentation on 11/25/22 for the exam, diagnosis, procedures, and orders are all accurate and complete.  Jacky Kindle, FNP  [facility] [phone] (phone) [phone] (fax)  [facility]

## 2022-11-25 NOTE — Assessment & Plan Note (Signed)
Chronic, previously overstimulated No symptoms at this time Repeat TSH previously on 175 mcg synthroid

## 2022-11-25 NOTE — Assessment & Plan Note (Signed)
Switch to diovan with hctz today

## 2022-11-25 NOTE — Assessment & Plan Note (Signed)
Chronic, unchanged Body mass index is 42.19 kg/m. Continue to recommend balanced, lower carb meals. Smaller meal size, adding snacks. Choosing water as drink of choice and increasing purposeful exercise.

## 2022-11-26 ENCOUNTER — Other Ambulatory Visit: Payer: Self-pay | Admitting: Family Medicine

## 2022-11-26 ENCOUNTER — Telehealth: Payer: Self-pay

## 2022-11-26 LAB — BASIC METABOLIC PANEL
BUN/Creatinine Ratio: 26 (ref 12–28)
BUN: 16 mg/dL (ref 8–27)
CO2: 24 mmol/L (ref 20–29)
Calcium: 9.4 mg/dL (ref 8.7–10.3)
Chloride: 104 mmol/L (ref 96–106)
Creatinine, Ser: 0.61 mg/dL (ref 0.57–1.00)
Glucose: 86 mg/dL (ref 70–99)
Potassium: 4.4 mmol/L (ref 3.5–5.2)
Sodium: 139 mmol/L (ref 134–144)
eGFR: 97 mL/min/{1.73_m2} (ref >59)

## 2022-11-26 LAB — TSH: TSH: 0.026 u[IU]/mL — ABNORMAL LOW (ref 0.450–4.500)

## 2022-11-26 MED ORDER — LEVOTHYROXINE SODIUM 150 MCG PO TABS: 150.0000 ug | ORAL_TABLET | ORAL | 0 refills | Status: DC

## 2022-11-26 NOTE — Telephone Encounter (Signed)
Pt called for lab results from yesterday. Shared provider's note.  TSH is now overcorrected; we have bounced from one extreme to another. I would recommend a combined dose between 150 and 175 mcg synthroid. Please let us know if you still have some 150 mcg. We can repeat at BP f/u.  Written by Jacky Kindle, FNP on 11/26/2022  7:57 AM EDT  Pt needs to have the called in she has none of this strength.   Pt also needs specific instructions on how to take these 2 medications.  Pt would like a call back.  Please advise.

## 2022-11-26 NOTE — Progress Notes (Signed)
TSH is now overcorrected; we have bounced from one extreme to another. I would recommend a combined dose between 150 and 175 mcg synthroid. Please let us know if you still have some 150 mcg. We can repeat at BP f/u.

## 2022-11-27 ENCOUNTER — Other Ambulatory Visit: Payer: Self-pay | Admitting: Family Medicine

## 2022-11-27 NOTE — Telephone Encounter (Signed)
Requested medications are due for refill today.  yes  Requested medications are on the active medications list.  yes  Last refill. 08/07/2022 #90 0 rf  Future visit scheduled.   yes  Notes to clinic.  Abnormal labs    Requested Prescriptions  Pending Prescriptions Disp Refills   levothyroxine (SYNTHROID) 175 MCG tablet 90 tablet 0    Sig: Take 1 tablet (175 mcg total) by mouth daily.     Endocrinology:  Hypothyroid Agents Failed - 11/27/2022  4:11 PM      Failed - TSH in normal range and within 360 days    TSH  Date Value Ref Range Status  11/25/2022 0.026 (L) 0.450 - 4.500 uIU/mL Final         Passed - Valid encounter within last 12 months    Recent Outpatient Visits           2 days ago Primary hypertension   [facility] Merita Norton T, FNP   2 months ago No-show for appointment   [facility] Jacky Kindle, FNP   3 months ago Annual physical exam   [facility] Merita Norton T, FNP   1 year ago Myalgia due to statin   [facility] Jacky Kindle, FNP   1 year ago Annual physical exam   [facility] Jacky Kindle, FNP       Future Appointments             In 4 weeks Jacky Kindle, FNP [facility], [facility]

## 2022-11-27 NOTE — Telephone Encounter (Signed)
Medication Refill - Medication: levothyroxine (SYNTHROID) 175 MCG tablet   Has the patient contacted their pharmacy? Yes.   (Agent: If no, request that the patient contact the pharmacy for the refill. If patient does not wish to contact the pharmacy document the reason why and proceed with request.) (Agent: If yes, when and what did the pharmacy advise?)  Preferred Pharmacy (with phone number or street name):  [facility] [place], [place] - [address]  [address] [place] [address]  Phone: [phone] Fax: [phone]   Has the patient been seen for an appointment in the last year OR does the patient have an upcoming appointment? Yes.    Agent: Please be advised that RX refills may take up to 3 business days. We ask that you follow-up with your pharmacy.

## 2022-11-30 ENCOUNTER — Ambulatory Visit: Payer: Self-pay | Admitting: *Deleted

## 2022-11-30 NOTE — Telephone Encounter (Signed)
Message from Randol Kern sent at 11/30/2022  8:12 AM EDT  Summary: Medication reaction   Pt had a bad reaction to Gabapentin 300 MG, says it made her very irritable and she is afraid to take it again because she is afraid that she will end up having a violent altercation with someone. Says this is not her character  Best contact: [phone]          Call History   Type Contact Phone/Fax User  11/30/2022 08:11 AM EDT Phone (Incoming) Kathrin Penner "Alona Bene" (Self) [phone] Rexene Edison) Randol Kern

## 2022-11-30 NOTE — Telephone Encounter (Signed)
Third attempt to reach pt.  "Call cannot be completed at this time." Routing to provider for resolution per protocol.

## 2022-11-30 NOTE — Telephone Encounter (Signed)
2nd attempt to return her call.   Left a voicemail to call back. 

## 2022-11-30 NOTE — Telephone Encounter (Signed)
Attempted to return her call.   Left a voicemail to call back to discuss symptoms with a nurse. 

## 2022-12-08 ENCOUNTER — Other Ambulatory Visit: Payer: Self-pay | Admitting: Family Medicine

## 2022-12-09 NOTE — Telephone Encounter (Signed)
Requested Prescriptions  Pending Prescriptions Disp Refills   levothyroxine (SYNTHROID) 175 MCG tablet [Pharmacy Med Name: LEVOTHYROXINE SODIUM 175 MCG TAB] 90 tablet 0    Sig: TAKE 1 TABLET BY MOUTH ONCE DAILY ON AN EMPTY STOMACH. WAIT 30 MINUTES BEFORE TAKING OTHER MEDS.     Endocrinology:  Hypothyroid Agents Failed - 12/08/2022 10:37 AM      Failed - TSH in normal range and within 360 days    TSH  Date Value Ref Range Status  11/25/2022 0.026 (L) 0.450 - 4.500 uIU/mL Final         Passed - Valid encounter within last 12 months    Recent Outpatient Visits           2 weeks ago Primary hypertension   [facility] Jacky Kindle, FNP   3 months ago No-show for appointment   [facility] Jacky Kindle, FNP   4 months ago Annual physical exam   [facility] Merita Norton T, FNP   1 year ago Myalgia due to statin   [facility] Jacky Kindle, FNP   1 year ago Annual physical exam   [facility] Jacky Kindle, FNP       Future Appointments             In 2 weeks Jacky Kindle, FNP [facility], [facility]

## 2022-12-10 ENCOUNTER — Telehealth: Payer: Self-pay

## 2022-12-10 NOTE — Telephone Encounter (Signed)
This encounter was created in error - please disregard.

## 2022-12-10 NOTE — Telephone Encounter (Addendum)
Pt called back to ask what alternative therapy can be given rather than Gabapentin. Pt has stopped taking it. See triage note dated 11/30/22. Was not able to talk to pt. Pt stated Gabapentin made her very aggressing and was looking to fight someone. She stopped taking it and no trouble since.  Please call pt and advise. Listing Gabapentin as an allergy.

## 2022-12-11 NOTE — Telephone Encounter (Signed)
LVMTCB. CRM created. Ok for PEC to advise 

## 2022-12-15 ENCOUNTER — Other Ambulatory Visit: Payer: Self-pay | Admitting: Family Medicine

## 2022-12-15 ENCOUNTER — Telehealth: Payer: Self-pay | Admitting: Family Medicine

## 2022-12-15 MED ORDER — TEMAZEPAM 7.5 MG PO CAPS: ORAL_CAPSULE | ORAL | 0 refills | Status: DC

## 2022-12-15 NOTE — Telephone Encounter (Signed)
Received a fax from covermymeds for Temazepam 7.5mg   Key: [id number]

## 2022-12-15 NOTE — Telephone Encounter (Signed)
Pt called n she says the replacement med for the Tamazepam(she doesn't know te name of the replacement? She says its too expensive at 300.00 and he rinsurance wont cover it.

## 2022-12-17 ENCOUNTER — Other Ambulatory Visit: Payer: Self-pay | Admitting: Family Medicine

## 2022-12-17 MED ORDER — ROPINIROLE HCL ER 2 MG PO TB24: 2.0000 mg | ORAL_TABLET | Freq: Every day | ORAL | 0 refills | Status: DC

## 2022-12-18 NOTE — Telephone Encounter (Signed)
Lvmtcb to confirm she did want medication to pharmacy. Ok for [employer] nurse to advise

## 2022-12-21 ENCOUNTER — Telehealth: Payer: Self-pay | Admitting: Family Medicine

## 2022-12-21 NOTE — Telephone Encounter (Signed)
Covermymeds is requesting prior authorization Key: [id number] Name: Tammy Bailey Temazepam 7.5 mg capsules

## 2022-12-25 ENCOUNTER — Ambulatory Visit: Payer: 59 | Admitting: Family Medicine

## 2022-12-31 ENCOUNTER — Ambulatory Visit: Payer: 59 | Admitting: Family Medicine

## 2022-12-31 NOTE — Telephone Encounter (Signed)
PA started today.  

## 2022-12-31 NOTE — Telephone Encounter (Signed)
duplicate

## 2023-01-01 NOTE — Telephone Encounter (Signed)
PA denied. Plan denied request because (quantity limit). Plan covers up to : 15 capsules per month of temazepam.

## 2023-01-06 ENCOUNTER — Encounter: Payer: Self-pay | Admitting: Family Medicine

## 2023-01-06 ENCOUNTER — Ambulatory Visit (INDEPENDENT_AMBULATORY_CARE_PROVIDER_SITE_OTHER): Payer: 59 | Admitting: Family Medicine

## 2023-01-06 VITALS — BP 125/76 | HR 72 | Ht 59.0 in | Wt 205.7 lb

## 2023-01-06 DIAGNOSIS — I10 Essential (primary) hypertension: Secondary | ICD-10-CM | POA: Diagnosis not present

## 2023-01-06 DIAGNOSIS — M609 Myositis, unspecified: Secondary | ICD-10-CM | POA: Insufficient documentation

## 2023-01-06 DIAGNOSIS — T466X5A Adverse effect of antihyperlipidemic and antiarteriosclerotic drugs, initial encounter: Secondary | ICD-10-CM

## 2023-01-06 NOTE — Progress Notes (Signed)
Established patient visit  Patient: Tammy Bailey   DOB: [birth year]   67 y.o. Female  MRN: [id number] Visit Date: 01/06/2023  Today's healthcare provider: Jacky Kindle, FNP  Introduced to nurse practitioner role and practice setting.  All questions answered.  Discussed provider/patient relationship and expectations.  Chief Complaint  Patient presents with   Medical Management of Chronic Issues    Patient is present for one month blood pressure check. Patient reports she does not check her bp at home and trying to lower salt intake. She reports palpitations and lower leg edema   Subjective    HPI HPI     Medical Management of Chronic Issues    Additional comments: Patient is present for one month blood pressure check. Patient reports she does not check her bp at home and trying to lower salt intake. She reports palpitations and lower leg edema        Comments   Patient has declined her pneumonia, shingles and tetanus vaccine today. She also would like to wait to have cologuard reordered      Last edited by Tammy Bailey, CMA on 01/06/2023  2:08 PM.      Medications: Outpatient Medications Prior to Visit  Medication Sig   amLODipine (NORVASC) 5 MG tablet Take 1 tablet (5 mg total) by mouth daily.   co-enzyme Q-10 30 MG capsule Take 7 capsules (210 mg total) by mouth 3 (three) times daily.   esomeprazole (NEXIUM) 20 MG packet Take 20 mg by mouth as needed.   estradiol (ESTRACE) 1 MG tablet Take 1 tablet (1 mg total) by mouth daily.   ezetimibe (ZETIA) 10 MG tablet Take 1 tablet (10 mg total) by mouth daily.   levothyroxine (SYNTHROID) 150 MCG tablet Take 1 tablet (150 mcg total) by mouth 3 (three) times a week.   levothyroxine (SYNTHROID) 175 MCG tablet TAKE 1 TABLET BY MOUTH ONCE DAILY ON AN EMPTY STOMACH. WAIT 30 MINUTES BEFORE TAKING OTHER MEDS.   valsartan-hydrochlorothiazide (DIOVAN-HCT) 160-25 MG tablet Take 1 tablet by mouth daily.   [DISCONTINUED] rOPINIRole  (REQUIP XL) 2 MG 24 hr tablet Take 1 tablet (2 mg total) by mouth at bedtime.   [DISCONTINUED] temazepam (RESTORIL) 7.5 MG capsule Take 1-2 capsules at night for leg pain   No facility-administered medications prior to visit.     Objective    BP 125/76 (BP Location: Left Wrist, Patient Position: Sitting, Cuff Size: Normal)   Pulse 72   Ht 4\' 11"  (1.499 m)   Wt 205 lb 11.2 oz (93.3 kg)   SpO2 98%   BMI 41.55 kg/m   Physical Exam Vitals and nursing note reviewed.  Constitutional:      General: She is not in acute distress.    Appearance: Normal appearance. She is obese. She is not ill-appearing, toxic-appearing or diaphoretic.  HENT:     Head: Normocephalic and atraumatic.  Cardiovascular:     Rate and Rhythm: Normal rate and regular rhythm.     Pulses: Normal pulses.     Heart sounds: Normal heart sounds. No murmur heard.    No friction rub. No gallop.  Pulmonary:     Effort: Pulmonary effort is normal. No respiratory distress.     Breath sounds: Normal breath sounds. No stridor. No wheezing, rhonchi or rales.  Chest:     Chest wall: No tenderness.  Musculoskeletal:        General: No swelling, tenderness, deformity or signs of injury. Normal range of  motion.     Right lower leg: Edema present.     Left lower leg: Edema present.     Comments: Ongoing LE edema; encouraged to keep feet moving and stay hydrated. Can trial compression stockings or sleeves to assist BP control and low salt diet.  Skin:    General: Skin is warm and dry.     Capillary Refill: Capillary refill takes less than 2 seconds.     Coloration: Skin is not jaundiced or pale.     Findings: No bruising, erythema, lesion or rash.  Neurological:     General: No focal deficit present.     Mental Status: She is alert and oriented to person, place, and time. Mental status is at baseline.     Cranial Nerves: No cranial nerve deficit.     Sensory: No sensory deficit.     Motor: No weakness.     Coordination:  Coordination normal.  Psychiatric:        Mood and Affect: Mood normal.        Behavior: Behavior normal.        Thought Content: Thought content normal.        Judgment: Judgment normal.      No results found for any visits on 01/06/23.  Assessment & Plan     Problem List Items Addressed This Visit       Cardiovascular and Mediastinum   Primary hypertension - Primary    Chronic, improved Continues on norvasc 5 as well as diovan-hct 160-25        Musculoskeletal and Integument   Statin-induced myositis     Other   Morbid obesity (HCC)    Chronic, improved Body mass index is 41.55 kg/m. Continue to recommend balanced, lower carb meals. Smaller meal size, adding snacks. Choosing water as drink of choice and increasing purposeful exercise.       Return in about 7 months (around 08/09/2023) for annual examination.     Tammy Merl, FNP, have reviewed all documentation for this visit. The documentation on 01/06/23 for the exam, diagnosis, procedures, and orders are all accurate and complete.  Tammy Kindle, FNP  [facility] [phone] (phone) [phone] (fax)  [facility]

## 2023-01-06 NOTE — Patient Instructions (Addendum)
Please contact [phone] to schedule your mammogram. You will be asked your location preference to have procedure performed. You have two options listed below.  1) [facility] located at [address] [place], [place] [address] 2) [facility] located at [address] [place], [place] [address]   Also please see about having your bone density scan scheduled.  Upon results being received our office will contact you. As well as all results can be viewed through your MyChart. Please feel free to contact us if you have any further questions or concerns.

## 2023-01-06 NOTE — Assessment & Plan Note (Signed)
Chronic, improved Continues on norvasc 5 as well as diovan-hct 160-25

## 2023-01-06 NOTE — Assessment & Plan Note (Signed)
Chronic, improved Body mass index is 41.55 kg/m. Continue to recommend balanced, lower carb meals. Smaller meal size, adding snacks. Choosing water as drink of choice and increasing purposeful exercise.

## 2023-03-18 ENCOUNTER — Ambulatory Visit: Payer: Self-pay | Admitting: *Deleted

## 2023-03-18 NOTE — Telephone Encounter (Signed)
Summary: dizziness   Pt called in about dizziness last night and today          Called patient [phone] to review sx of dizziness. No answer, LVMTCB [phone].

## 2023-03-18 NOTE — Telephone Encounter (Signed)
Summary: dizziness   Pt called in about dizziness last night and today     Call cannot be completed at this time.

## 2023-03-18 NOTE — Telephone Encounter (Signed)
Summary: dizziness   Pt called in about dizziness last night and today    Called pt - left message on machine to return our call. After 3 attempts unable to contact pt. I will forward encounter to office for follow up.

## 2023-03-19 ENCOUNTER — Ambulatory Visit: Payer: Self-pay | Admitting: *Deleted

## 2023-03-19 ENCOUNTER — Encounter: Payer: Self-pay | Admitting: Family Medicine

## 2023-03-19 ENCOUNTER — Ambulatory Visit (INDEPENDENT_AMBULATORY_CARE_PROVIDER_SITE_OTHER): Payer: 59 | Admitting: Family Medicine

## 2023-03-19 ENCOUNTER — Telehealth: Payer: Self-pay | Admitting: Family Medicine

## 2023-03-19 VITALS — BP 131/70 | HR 64 | Ht 59.0 in | Wt 204.9 lb

## 2023-03-19 DIAGNOSIS — H8110 Benign paroxysmal vertigo, unspecified ear: Secondary | ICD-10-CM

## 2023-03-19 MED ORDER — ONDANSETRON HCL 4 MG PO TABS: 4.0000 mg | ORAL_TABLET | Freq: Three times a day (TID) | ORAL | 0 refills | Status: DC | PRN

## 2023-03-19 MED ORDER — MECLIZINE HCL 25 MG PO TABS: 25.0000 mg | ORAL_TABLET | Freq: Three times a day (TID) | ORAL | 0 refills | Status: DC | PRN

## 2023-03-19 NOTE — Telephone Encounter (Signed)
Recieved a fax from covermymeds for Meclizine HCI 25MG  Tablets /A prior Authorization has been started   key: [facility]

## 2023-03-19 NOTE — Telephone Encounter (Signed)
Patient reports that she called this morning and is feeling worse and got scheduled.

## 2023-03-19 NOTE — Telephone Encounter (Signed)
  Chief Complaint: dizziness Symptoms: dizziness to the point of nausea/vomiting when stands- patient reports no symptoms when sitting Frequency: started Wednesday Pertinent Negatives: Patient denies fever,chest pain, breathing problems Disposition: [] ED /[] Urgent Care (no appt availability in office) / [x] Appointment(In office/virtual)/ []  [place] [facility]/ [] Home Care/ [] Refused Recommended Disposition /[]  Mobile Bus/ []  Follow-up with PCP Additional Notes: Patient states she started having dizziness Wednesday- nausea today- only dizzy when standing- nausea- vomited once. Patient states as long as she is sitting she is fine. Appointment scheduled- patient given strict instruction on emergent symptoms- go to ED.    Reason for Disposition  [1] MODERATE dizziness (e.g., interferes with normal activities) AND [2] has NOT been evaluated by doctor (or NP/PA) for this  (Exception: Dizziness caused by heat exposure, sudden standing, or poor fluid intake.)  Answer Assessment - Initial Assessment Questions 1. DESCRIPTION: "Describe your dizziness."     Nausea, can't keep balance 2. LIGHTHEADED: "Do you feel lightheaded?" (e.g., somewhat faint, woozy, weak upon standing)     Felt faint when she got up this morning 3. VERTIGO: "Do you feel like either you or the room is spinning or tilting?" (i.e. vertigo)     no 4. SEVERITY: "How bad is it?"  "Do you feel like you are going to faint?" "Can you stand and walk?"   - MILD: Feels slightly dizzy, but walking normally.   - MODERATE: Feels unsteady when walking, but not falling; interferes with normal activities (e.g., school, work).   - SEVERE: Unable to walk without falling, or requires assistance to walk without falling; feels like passing out now.      Moderate /severe 5. ONSET:  "When did the dizziness begin?"     Wednesday 6. AGGRAVATING FACTORS: "Does anything make it worse?" (e.g., standing, change in head position)     As long as  she is still- she is ok 7. HEART RATE: "Can you tell me your heart rate?" "How many beats in 15 seconds?"  (Note: not all patients can do this)       Not checked 8. CAUSE: "What do you think is causing the dizziness?"     none 9. RECURRENT SYMPTOM: "Have you had dizziness before?" If Yes, ask: "When was the last time?" "What happened that time?"     Not in ages 65. OTHER SYMPTOMS: "Do you have any other symptoms?" (e.g., fever, chest pain, vomiting, diarrhea, bleeding)       vomiting  Protocols used: Dizziness - Lightheadedness-A-AH

## 2023-03-19 NOTE — Progress Notes (Signed)
Acute Office Visit  Subjective:     Patient ID: Tammy Bailey, female    DOB: May 20, 1956, 67 y.o.   MRN: [id number]  Chief Complaint  Patient presents with   Dizziness    Nausea since Wednesday     HPI Dizziness (not lightheadedness) x2 days. Worse with any movement. Nausea and vomiting 2/2 dizziness. Better yesterday after dramamine, but then worse again this AM. Unable to go to work today.   ROS per HPI      Objective:    BP 131/70 (BP Location: Left Arm, Patient Position: Sitting, Cuff Size: Large)   Pulse 64   Ht 4\' 11"  (1.499 m)   Wt 204 lb 14.4 oz (92.9 kg)   SpO2 98%   BMI 41.38 kg/m    Physical Exam Vitals reviewed.  Constitutional:      General: She is not in acute distress.    Appearance: Normal appearance. She is well-developed. She is not diaphoretic.  HENT:     Head: Normocephalic and atraumatic.  Eyes:     General: No scleral icterus.    Conjunctiva/sclera: Conjunctivae normal.  Neck:     Thyroid: No thyromegaly.  Cardiovascular:     Rate and Rhythm: Normal rate and regular rhythm.     Heart sounds: Normal heart sounds.  Pulmonary:     Effort: Pulmonary effort is normal. No respiratory distress.     Breath sounds: Normal breath sounds. No wheezing, rhonchi or rales.  Abdominal:     General: There is no distension.     Palpations: Abdomen is soft.     Tenderness: There is no abdominal tenderness.  Musculoskeletal:     Cervical back: Neck supple.     Right lower leg: No edema.     Left lower leg: No edema.  Lymphadenopathy:     Cervical: No cervical adenopathy.  Skin:    General: Skin is warm and dry.  Neurological:     Mental Status: She is alert and oriented to person, place, and time. Mental status is at baseline.     Cranial Nerves: No cranial nerve deficit.     Sensory: No sensory deficit.     Motor: No weakness.     Coordination: Coordination normal.     Comments: Dizziness is provoked with EOM  Psychiatric:        Mood and  Affect: Mood normal.        Behavior: Behavior normal.     No results found for any visits on 03/19/23.      Assessment & Plan:    1. Benign paroxysmal positional vertigo, unspecified laterality - new problem - assoc with N/V - exam is c/w BPPV - discussed Epley maneuver after able to move around a little more - meclizine prn for vertigo - zofran prn for nausea - if not improving with home Epley maneuver consider vestibular PT   Meds ordered this encounter  Medications   meclizine (ANTIVERT) 25 MG tablet    Sig: Take 1 tablet (25 mg total) by mouth 3 (three) times daily as needed for dizziness.    Dispense:  30 tablet    Refill:  0   ondansetron (ZOFRAN) 4 MG tablet    Sig: Take 1 tablet (4 mg total) by mouth every 8 (eight) hours as needed for nausea or vomiting.    Dispense:  20 tablet    Refill:  0    Return if symptoms worsen or fail to improve.  Shirlee Latch,  MD

## 2023-03-19 NOTE — Telephone Encounter (Signed)
PA send to plan

## 2023-03-22 ENCOUNTER — Ambulatory Visit: Payer: Self-pay | Admitting: *Deleted

## 2023-03-22 NOTE — Telephone Encounter (Signed)
Continue meclizine and start epley maneuver from handout.

## 2023-03-22 NOTE — Telephone Encounter (Signed)
  Chief Complaint: Vertigo Symptoms: Spinning with turning head, standing to siting, lying to sitting then standing. Frequency: OV 03/19/23 Pertinent Negatives: Patient denies  Disposition: [] ED /[] Urgent Care (no appt availability in office) / [] Appointment(In office/virtual)/ []  [place] [facility]/ [] Home Care/ [] Refused Recommended Disposition /[] [place] Mobile Bus/ [x]  Follow-up with PCP Additional Notes: Pt states Meclizine is not working. States once she is up and about, no vertigo. Positional changes worsen. Pt was given handout regarding Epleys Maneuver, states has not done those as of yet. Please advise, needs appt? Pt questioning if she "Just needs to stay on med a while."  CB# [id number] Reason for Disposition  [1] MODERATE dizziness (e.g., vertigo; feels very unsteady, interferes with normal activities) AND [2] has been evaluated by doctor (or NP/PA) for this  Answer Assessment - Initial Assessment Questions 1. DESCRIPTION: "Describe your dizziness."     Spinning, turning head, sitting to standing 2. VERTIGO: "Do you feel like either you or the room is spinning or tilting?"      Yes 3. LIGHTHEADED: "Do you feel lightheaded?" (e.g., somewhat faint, woozy, weak upon standing)     Floating 4. SEVERITY: "How bad is it?"  "Can you walk?"   - MILD: Feels slightly dizzy and unsteady, but is walking normally.   - MODERATE: Feels unsteady when walking, but not falling; interferes with normal activities (e.g., school, work).   - SEVERE: Unable to walk without falling, or requires assistance to walk without falling.     Varies 5. ONSET:  "When did the dizziness begin?"     OV 03/20/23 6. AGGRAVATING FACTORS: "Does anything make it worse?" (e.g., standing, change in head position)     Getting out of bed 7. CAUSE: "What do you think is causing the dizziness?"     "Vertigo" 8. RECURRENT SYMPTOM: "Have you had dizziness before?" If Yes, ask: "When was the last time?" "What happened  that time?"     03/19/23 9. OTHER SYMPTOMS: "Do you have any other symptoms?" (e.g., headache, weakness, numbness, vomiting, earache)     None  Protocols used: Dizziness - Vertigo-A-AH

## 2023-03-23 NOTE — Telephone Encounter (Signed)
Patient returned call - she has been taking Meclizine regularly(3 times/day)- not helping as much as it should. Patient states she did not get hand out- can it be sent in MyChart?  Patient has to go to back to work tomorrow-  patient states she had blood vessel burst in R eye- inner corner and wanted to know if that could be related to vertigo.Patient wants to know what next steps will be if this doesn't work.

## 2023-03-23 NOTE — Telephone Encounter (Signed)
LMTCB-Ok for [facility] Nurse to give provider message.

## 2023-03-24 ENCOUNTER — Other Ambulatory Visit: Payer: Self-pay | Admitting: Family Medicine

## 2023-03-24 DIAGNOSIS — H8110 Benign paroxysmal vertigo, unspecified ear: Secondary | ICD-10-CM

## 2023-03-24 NOTE — Telephone Encounter (Signed)
Outcome Denied on October 6 by [facility] NCPDP 2017 Your PA request has been denied.  Drug Meclizine HCl 25MG  tablets

## 2023-03-24 NOTE — Telephone Encounter (Signed)
Patient advised. Verbalized understanding 

## 2023-03-29 ENCOUNTER — Other Ambulatory Visit: Payer: Self-pay | Admitting: Family Medicine

## 2023-03-29 DIAGNOSIS — Z780 Asymptomatic menopausal state: Secondary | ICD-10-CM

## 2023-03-30 NOTE — Telephone Encounter (Signed)
Requested medication (s) are due for refill today:   Yes  Requested medication (s) are on the active medication list:   Yes  Future visit scheduled:   Yes 08/10/2023 with Robynn Pane   Last ordered: 08/07/2022 #90, 1 refill  Unable to refill because mammogram is due per protocol.     Requested Prescriptions  Pending Prescriptions Disp Refills   estradiol (ESTRACE) 1 MG tablet [Pharmacy Med Name: ESTRADIOL 1 MG TAB] 90 tablet 1    Sig: TAKE 1 TABLET BY MOUTH ONCE DAILY     OB/GYN:  Estrogens Failed - 03/29/2023 10:04 AM      Failed - Mammogram is up-to-date per Health Maintenance      Passed - Last BP in normal range    BP Readings from Last 1 Encounters:  03/19/23 131/70         Passed - Valid encounter within last 12 months    Recent Outpatient Visits           1 week ago Benign paroxysmal positional vertigo, unspecified laterality   [facility] [place], Marzella Schlein, MD   2 months ago Primary hypertension   [place] [facility] Merita Norton T, FNP   4 months ago Primary hypertension   [place] [facility] Jacky Kindle, FNP   6 months ago No-show for appointment   [facility] Jacky Kindle, FNP   7 months ago Annual physical exam   [facility] Jacky Kindle, FNP       Future Appointments             In 4 months Jacky Kindle, FNP [facility], [facility]

## 2023-03-31 ENCOUNTER — Other Ambulatory Visit: Payer: Self-pay | Admitting: Family Medicine

## 2023-03-31 ENCOUNTER — Ambulatory Visit: Payer: Self-pay

## 2023-03-31 NOTE — Telephone Encounter (Signed)
Pt called, left message with desk clerk for pt to call office back. Pt had stepped away from desk.   Summary: Rx refill w/ sx   Pt calling to get a refill of the meclizine (ANTIVERT) 25 MG tablet.  She said she is still experiencing vertigo. She said an episode woke her up last night. She got 30 pills on 10/03 and now she is out.

## 2023-03-31 NOTE — Telephone Encounter (Signed)
Requested medication (s) are due for refill today: yes  Requested medication (s) are on the active medication list: yes  Last refill:  03/19/23  Future visit scheduled: yes  Notes to clinic:  Unable to refill per protocol, cannot delegate. See previous TE message.      Requested Prescriptions  Pending Prescriptions Disp Refills   meclizine (ANTIVERT) 25 MG tablet 30 tablet 0    Sig: Take 1 tablet (25 mg total) by mouth 3 (three) times daily as needed for dizziness.     Not Delegated - Gastroenterology: Antiemetics Failed - 03/31/2023 11:43 AM      Failed - This refill cannot be delegated      Passed - Valid encounter within last 6 months    Recent Outpatient Visits           1 week ago Benign paroxysmal positional vertigo, unspecified laterality   [facility] [place], Marzella Schlein, MD   2 months ago Primary hypertension   [place] [facility] Merita Norton T, FNP   4 months ago Primary hypertension   [facility] Jacky Kindle, FNP   7 months ago No-show for appointment   [facility] Jacky Kindle, FNP   7 months ago Annual physical exam   [facility] Jacky Kindle, FNP       Future Appointments             In 4 months Jacky Kindle, FNP [facility], [facility]

## 2023-03-31 NOTE — Telephone Encounter (Signed)
Pt states had episode of vertigo last night. States she would like to try medication "One more time."  States if not resolved will go to P.T. as advised. Reports no nausea, no vomiting with this episode.   Refill request sent to [facility] Pool for review.

## 2023-03-31 NOTE — Telephone Encounter (Unsigned)
Copied from CRM [id number]. Topic: General - Other >> Mar 31, 2023  4:54 PM Tammy Bailey wrote: Reason for CRM: Medication Refill - Medication: meclizine (ANTIVERT) 25 MG tablet [[id number]]  - patient has 1 tablet remaining   Has the patient contacted their pharmacy? Yes.   (Agent: If no, request that the patient contact the pharmacy for the refill. If patient does not wish to contact the pharmacy document the reason why and proceed with request.) (Agent: If yes, when and what did the pharmacy advise?)  Preferred Pharmacy (with phone number or street name): [facility] [place], [place] - [address] [place] [address] Phone: [phone] Fax: [phone] Hours: Not open 24 hours   Has the patient been seen for an appointment in the last year OR does the patient have an upcoming appointment? Yes.    Agent: Please be advised that RX refills may take up to 3 business days. We ask that you follow-up with your pharmacy.

## 2023-04-01 ENCOUNTER — Other Ambulatory Visit: Payer: Self-pay | Admitting: Family Medicine

## 2023-04-01 NOTE — Telephone Encounter (Signed)
Requested medication (s) are due for refill today: yes  Requested medication (s) are on the active medication list: yes  Last refill:  03/19/23  Future visit scheduled: yes  Notes to clinic:  Unable to refill per protocol, cannot delegate. Medication previously refused      Requested Prescriptions  Pending Prescriptions Disp Refills   meclizine (ANTIVERT) 25 MG tablet [Pharmacy Med Name: MECLIZINE HCL 25 MG TAB] 30 tablet 0    Sig: TAKE 1 TABLET BY MOUTH 3 TIMES DAILY AS NEEDED FOR DIZZINESS     Not Delegated - Gastroenterology: Antiemetics Failed - 04/01/2023 10:37 AM      Failed - This refill cannot be delegated      Passed - Valid encounter within last 6 months    Recent Outpatient Visits           1 week ago Benign paroxysmal positional vertigo, unspecified laterality   [facility] [place], Marzella Schlein, MD   2 months ago Primary hypertension   [place] [facility] Merita Norton T, FNP   4 months ago Primary hypertension   [facility] Jacky Kindle, FNP   7 months ago No-show for appointment   [facility] Jacky Kindle, FNP   7 months ago Annual physical exam   [facility] Jacky Kindle, FNP       Future Appointments             In 4 months Jacky Kindle, FNP [facility], [facility] [address]

## 2023-04-01 NOTE — Telephone Encounter (Signed)
Requested medication (s) are due for refill today: yes  Requested medication (s) are on the active medication list: yes  Last refill:  03/19/23  Future visit scheduled: yes  Notes to clinic:  Unable to refill per protocol, cannot delegate. Please route to provider, patient is out of medication. Last refill 03/19/23 for 30, patient taking medication 3 times a day. 10 day supply was last given.      Requested Prescriptions  Pending Prescriptions Disp Refills   meclizine (ANTIVERT) 25 MG tablet 30 tablet 0    Sig: Take 1 tablet (25 mg total) by mouth 3 (three) times daily as needed for dizziness.     Not Delegated - Gastroenterology: Antiemetics Failed - 03/31/2023  5:48 PM      Failed - This refill cannot be delegated      Passed - Valid encounter within last 6 months    Recent Outpatient Visits           1 week ago Benign paroxysmal positional vertigo, unspecified laterality   [facility] [place], Marzella Schlein, MD   2 months ago Primary hypertension   [place] [facility] Merita Norton T, FNP   4 months ago Primary hypertension   [place] [facility] Jacky Kindle, FNP   7 months ago No-show for appointment   [facility] Jacky Kindle, FNP   7 months ago Annual physical exam   [facility] Jacky Kindle, FNP       Future Appointments             In 4 months Jacky Kindle, FNP [facility], [facility]

## 2023-04-01 NOTE — Telephone Encounter (Signed)
Requested medications are due for refill today.  yes  Requested medications are on the active medications list.  yes  Last refill. 03/19/2023 30/0  Future visit scheduled.   yes  Notes to clinic.  Refill not delegated.    Requested Prescriptions  Pending Prescriptions Disp Refills   meclizine (ANTIVERT) 25 MG tablet [Pharmacy Med Name: MECLIZINE HCL 25 MG TAB] 30 tablet 0    Sig: TAKE 1 TABLET BY MOUTH 3 TIMES DAILY AS NEEDED FOR DIZZINESS     Not Delegated - Gastroenterology: Antiemetics Failed - 03/31/2023  4:37 PM      Failed - This refill cannot be delegated      Passed - Valid encounter within last 6 months    Recent Outpatient Visits           1 week ago Benign paroxysmal positional vertigo, unspecified laterality   [facility] [place], Marzella Schlein, MD   2 months ago Primary hypertension   [facility] Merita Norton T, FNP   4 months ago Primary hypertension   [facility] Jacky Kindle, FNP   7 months ago No-show for appointment   [facility] Jacky Kindle, FNP   7 months ago Annual physical exam   [facility] Jacky Kindle, FNP       Future Appointments             In 4 months Jacky Kindle, FNP [facility], [facility]

## 2023-04-05 ENCOUNTER — Ambulatory Visit: Payer: Self-pay

## 2023-04-05 ENCOUNTER — Other Ambulatory Visit: Payer: Self-pay | Admitting: Family Medicine

## 2023-04-05 MED ORDER — MECLIZINE HCL 25 MG PO TABS: 25.0000 mg | ORAL_TABLET | Freq: Three times a day (TID) | ORAL | 0 refills | Status: DC | PRN

## 2023-04-05 NOTE — Telephone Encounter (Signed)
Patient called, left VM to return the call to the office to speak to the NT.  Unable to reach patient after 3 attempts by [facility] NT, routing to the provider for resolution per protocol.    Summary: needs meclazine   Pt said she called last week for a refill of the meclizine 25 mg.  She continues to be dizzy, and needs this medication asap. She thought it was to be called in from her conversations last week

## 2023-04-05 NOTE — Addendum Note (Signed)
Addended by: Josph Macho E on: 04/05/2023 11:27 AM   Modules accepted: Orders

## 2023-04-05 NOTE — Telephone Encounter (Signed)
Summary: needs meclazine     Pt said she called last week for a refill of the meclizine 25 mg.  She continues to be dizzy, and needs this medication asap. She thought it was to be called in from her conversations last week      Chief Complaint: pt wanting refill of Meclizine Symptoms: vertigo Disposition: [] ED /[] Urgent Care (no appt availability in office) / [] Appointment(In office/virtual)/ []  [place] [facility]/ [] Home Care/ [] Refused Recommended Disposition /[] [place] [facility]/ [x]  Follow-up with PCP Additional Notes: per chart review Last ordered 03/19/23 # 30 SIG: take 1 tablet by mouth 3 times daily as needed. Pt had 10 days worth. Pt was begging to have it refilled "asap" advised pt of 48 to 72 hour turnaround time. Send to [facility].   Reason for Disposition  [1] Prescription refill request for ESSENTIAL medicine (i.e., likelihood of harm to patient if not taken) AND [2] triager unable to refill per department policy  Protocols used: Medication Refill and Renewal Call-A-AH

## 2023-04-05 NOTE — Telephone Encounter (Signed)
Patient called, left VM to return the call to the office to speak to the NT.    Summary: needs meclazine   Pt said she called last week for a refill of the meclizine 25 mg.  She continues to be dizzy, and needs this medication asap. She thought it was to be called in from her conversations last week

## 2023-04-05 NOTE — Telephone Encounter (Signed)
Summary: needs meclazine   Pt said she called last week for a refill of the meclizine 25 mg.  She continues to be dizzy, and needs this medication asap. She thought it was to be called in from her conversations last week         Called pt - LMOM to return our call.

## 2023-04-06 NOTE — Telephone Encounter (Signed)
Requested medications are due for refill today.  no  Requested medications are on the active medications list.  yes  Last refill. 04/05/2023 #30 0 rf  Future visit scheduled.   yes  Notes to clinic.  Refill/ refusal not delegated.    Requested Prescriptions  Pending Prescriptions Disp Refills   meclizine (ANTIVERT) 25 MG tablet [Pharmacy Med Name: MECLIZINE HCL 25 MG TAB] 30 tablet 0    Sig: TAKE 1 TABLET BY MOUTH 3 TIMES DAILY AS NEEDED FOR DIZZINESS     Not Delegated - Gastroenterology: Antiemetics Failed - 04/05/2023 11:08 AM      Failed - This refill cannot be delegated      Passed - Valid encounter within last 6 months    Recent Outpatient Visits           2 weeks ago Benign paroxysmal positional vertigo, unspecified laterality   [facility] [place], Marzella Schlein, MD   3 months ago Primary hypertension   [facility] Merita Norton T, FNP   4 months ago Primary hypertension   [place] [facility] Jacky Kindle, FNP   7 months ago No-show for appointment   [facility] Jacky Kindle, FNP   8 months ago Annual physical exam   [facility] Jacky Kindle, FNP       Future Appointments             In 4 months Jacky Kindle, FNP [facility], [facility]

## 2023-04-15 ENCOUNTER — Ambulatory Visit: Payer: Self-pay | Admitting: *Deleted

## 2023-04-15 NOTE — Telephone Encounter (Signed)
Reason for Disposition  Ear congestion present > 48 hours  Answer Assessment - Initial Assessment Questions 1. LOCATION: "Which ear is involved?"       Right ear 2. SENSATION: "Describe how the ear feels." (e.g. stuffy, full, plugged)."      Congested- stopped up- dizziness with movement- congestion-/numbness in ear- depends on how patient lays on the ear, can tell during the day when talking 3. ONSET:  "When did the ear symptoms start?"       Last week- patient noticed the ear symptoms 4. PAIN: "Do you also have an earache?" If Yes, ask: "How bad is it?" (Scale 1-10; or mild, moderate, severe)     No pain 5. CAUSE: "What do you think is causing the ear congestion?"     Possible congestion in ear 6. URI: "Do you have a runny nose or cough?"      no 7. NASAL ALLERGIES: "Are there symptoms of hay fever, such as sneezing or a clear nasal discharge?"     sneezing  Protocols used: Ear - Congestion-A-AH

## 2023-04-15 NOTE — Telephone Encounter (Signed)
  Chief Complaint: ear pressure Symptoms: patient states she has been diagnosed with vertigo- but the medication is not helping- she has noticed she has R ear congestion and she wonders if that could be causing her dizziness.  Frequency: 1 week Pertinent Negatives: Patient denies pain Disposition: [] ED /[] Urgent Care (no appt availability in office) / [x] Appointment(In office/virtual)/ []  [place] [facility]/ [] Home Care/ [] Refused Recommended Disposition /[] [place] [facility]/ []  Follow-up with PCP Additional Notes: Appointment has been scheduled to evaluate new symptom

## 2023-04-16 ENCOUNTER — Ambulatory Visit: Payer: 59 | Admitting: Physician Assistant

## 2023-04-16 ENCOUNTER — Encounter: Payer: Self-pay | Admitting: Physician Assistant

## 2023-04-16 VITALS — BP 107/61 | HR 72 | Ht 59.0 in | Wt 210.2 lb

## 2023-04-16 DIAGNOSIS — J302 Other seasonal allergic rhinitis: Secondary | ICD-10-CM | POA: Diagnosis not present

## 2023-04-16 DIAGNOSIS — R42 Dizziness and giddiness: Secondary | ICD-10-CM

## 2023-04-16 NOTE — Progress Notes (Unsigned)
Established patient visit  Patient: Tammy Bailey   DOB: [birth year]   67 y.o. Female  MRN: [id number] Visit Date: 04/16/2023  Today's healthcare provider: Debera Lat, PA-C   Chief Complaint  Patient presents with   Ear Fullness    Right ear X 3 weeks. Patient believes this is where her dizziness is coming from. Reports if she sleep on her right side everything is puffy in the morning.    Subjective     Discussed the use of AI scribe software for clinical note transcription with the patient, who gave verbal consent to proceed.  History of Present Illness   The patient, with a history of hypothyroidism, presents with a three-week history of right ear problems. They report dizziness, swelling behind the ear, a feeling of fullness in the ear, and decreased hearing. The patient denies any recent viral infections but reports facial pain. The patient also experiences dizziness when changing positions, such as lying down to standing. The patient admits to not drinking enough water and not eating regular meals. The patient also reports occasional post-nasal drainage and sneezing, suggesting possible allergies.           04/16/2023    3:57 PM 11/25/2022    3:31 PM 08/06/2022    9:38 AM  Depression screen PHQ 2/9  Decreased Interest 2 1 2   Down, Depressed, Hopeless 2 2 2   PHQ - 2 Score 4 3 4   Altered sleeping 2 3 0  Tired, decreased energy 3 3 2   Change in appetite 3 1 3   Feeling bad or failure about yourself  1 0 0  Trouble concentrating 2 0 1  Moving slowly or fidgety/restless 1 0 0  Suicidal thoughts 0 0 1  PHQ-9 Score 16 10 11   Difficult doing work/chores Somewhat difficult Very difficult Very difficult      04/16/2023    3:57 PM 02/01/2020    5:49 PM  GAD 7 : Generalized Anxiety Score  Nervous, Anxious, on Edge 3 3  Control/stop worrying 3 3  Worry too much - different things 3 3  Trouble relaxing 3 0  Restless 2 2  Easily annoyed or irritable 3 2  Afraid - awful might  happen 3 2  Total GAD 7 Score 20 15  Anxiety Difficulty Very difficult Very difficult    Medications: Outpatient Medications Prior to Visit  Medication Sig   amLODipine (NORVASC) 5 MG tablet Take 1 tablet (5 mg total) by mouth daily.   co-enzyme Q-10 30 MG capsule Take 7 capsules (210 mg total) by mouth 3 (three) times daily.   esomeprazole (NEXIUM) 20 MG packet Take 20 mg by mouth as needed.   estradiol (ESTRACE) 1 MG tablet TAKE 1 TABLET BY MOUTH ONCE DAILY   ezetimibe (ZETIA) 10 MG tablet Take 1 tablet (10 mg total) by mouth daily.   levothyroxine (SYNTHROID) 150 MCG tablet Take 1 tablet (150 mcg total) by mouth 3 (three) times a week.   levothyroxine (SYNTHROID) 175 MCG tablet TAKE 1 TABLET BY MOUTH ONCE DAILY ON AN EMPTY STOMACH. WAIT 30 MINUTES BEFORE TAKING OTHER MEDS.   meclizine (ANTIVERT) 25 MG tablet Take 1 tablet (25 mg total) by mouth 3 (three) times daily as needed for dizziness.   ondansetron (ZOFRAN) 4 MG tablet Take 1 tablet (4 mg total) by mouth every 8 (eight) hours as needed for nausea or vomiting.   valsartan-hydrochlorothiazide (DIOVAN-HCT) 160-25 MG tablet Take 1 tablet by mouth daily.   No facility-administered medications prior  to visit.    Review of Systems  All other systems reviewed and are negative.  Except see HPI   {Insert previous labs (optional):23779} {See past labs  Heme  Chem  Endocrine  Serology  Results Review (optional):1}   Objective    BP 107/61 (BP Location: Left Arm, Patient Position: Sitting, Cuff Size: Large)   Pulse 72   Ht 4\' 11"  (1.499 m)   Wt 210 lb 3.2 oz (95.3 kg)   SpO2 100%   BMI 42.46 kg/m  {Insert last BP/Wt (optional):23777}{See vitals history (optional):1}   Physical Exam Vitals reviewed.  Constitutional:      General: She is not in acute distress.    Appearance: Normal appearance. She is well-developed. She is not diaphoretic.  HENT:     Head: Normocephalic and atraumatic.  Eyes:     General: No scleral  icterus.    Conjunctiva/sclera: Conjunctivae normal.  Neck:     Thyroid: No thyromegaly.  Cardiovascular:     Rate and Rhythm: Normal rate and regular rhythm.     Pulses: Normal pulses.     Heart sounds: Normal heart sounds. No murmur heard. Pulmonary:     Effort: Pulmonary effort is normal. No respiratory distress.     Breath sounds: Normal breath sounds. No wheezing, rhonchi or rales.  Musculoskeletal:     Cervical back: Neck supple.     Right lower leg: No edema.     Left lower leg: No edema.  Lymphadenopathy:     Cervical: No cervical adenopathy.  Skin:    General: Skin is warm and dry.     Findings: No rash.  Neurological:     Mental Status: She is alert and oriented to person, place, and time. Mental status is at baseline.  Psychiatric:        Mood and Affect: Mood normal.        Behavior: Behavior normal.      No results found for any visits on 04/16/23.  Assessment & Plan        Right Ear Fullness and Dizziness Symptoms suggestive of vertigo with associated ear fullness and swelling. No pain or drainage. Fluid noted in the ear on examination. Possible association with positional changes. -Start nasal saline rinse or spray for congestion. -Start Flonase for anti-inflammatory effect. -Start over-the-counter antihistamines (Allegra, Claritin, or Zyrtec if needed) for potential allergies. -Perform Epley maneuver at home for vertigo. -Check back if symptoms do not improve.  Seasonal Allergies Chronic sneezing and congestion. Nasal congestion noted on examination. -Continue nasal saline rinse or spray. -Continue Flonase. -Continue over-the-counter antihistamines (Allegra, Claritin, or Zyrtec if needed). -Consider Benadryl at bedtime if symptoms are severe.  General Health Maintenance -Encouraged to maintain hydration and regular, healthy meals. -Advised to make slow transitions from lying to sitting to standing positions to prevent dizziness. -Advised to monitor  for any changes in skin condition and report if any new changes are noted.      No follow-ups on file.     The patient was advised to call back or seek an in-person evaluation if the symptoms worsen or if the condition fails to improve as anticipated.  I discussed the assessment and treatment plan with the patient. The patient was provided an opportunity to ask questions and all were answered. The patient agreed with the plan and demonstrated an understanding of the instructions.  I, Debera Lat, PA-C have reviewed all documentation for this visit. The documentation on  04/16/23 for the exam, diagnosis,  procedures, and orders are all accurate and complete.  Debera Lat, [facility], MMS [facility] [phone] (phone) [phone] (fax)  [facility]

## 2023-04-19 ENCOUNTER — Ambulatory Visit: Payer: Self-pay

## 2023-04-19 NOTE — Telephone Encounter (Signed)
Summary: rx req / sinus discomfort   The patient has called to request an antibiotic to help with their congestion and dizziness  The patient shares that they have experienced symptoms for roughly a month  The patient shares that they were seen on 04/16/23 and told that there was fluid in their ears  The patient would like to be contacted by a member of clinical staff when available    Called pt - left message to return call.

## 2023-04-19 NOTE — Telephone Encounter (Signed)
Summary: rx req / sinus discomfort   The patient has called to request an antibiotic to help with their congestion and dizziness  The patient shares that they have experienced symptoms for roughly a month  The patient shares that they were seen on 04/16/23 and told that there was fluid in their ears  The patient would like to be contacted by a member of clinical staff when available          Called pt - left message on machine to return our call.

## 2023-04-19 NOTE — Telephone Encounter (Signed)
Summary: rx req / sinus discomfort   The patient has called to request an antibiotic to help with their congestion and dizziness  The patient shares that they have experienced symptoms for roughly a month  The patient shares that they were seen on 04/16/23 and told that there was fluid in their ears  The patient would like to be contacted by a member of clinical staff when available          Called pt - Left message on machine to return our call. After 3 attempts unable to contact pt. Will forward encounter to clinic for follow up.

## 2023-04-20 NOTE — Telephone Encounter (Signed)
Patient schedule appointment with Dr. Sherrie Mustache

## 2023-04-30 ENCOUNTER — Ambulatory Visit: Payer: 59 | Admitting: Family Medicine

## 2023-05-11 ENCOUNTER — Ambulatory Visit: Payer: 59 | Admitting: Family Medicine

## 2023-05-11 ENCOUNTER — Encounter: Payer: Self-pay | Admitting: Family Medicine

## 2023-05-11 VITALS — BP 127/77 | HR 72 | Ht 59.0 in | Wt 208.0 lb

## 2023-05-11 DIAGNOSIS — E782 Mixed hyperlipidemia: Secondary | ICD-10-CM | POA: Diagnosis not present

## 2023-05-11 DIAGNOSIS — R7303 Prediabetes: Secondary | ICD-10-CM | POA: Diagnosis not present

## 2023-05-11 DIAGNOSIS — E538 Deficiency of other specified B group vitamins: Secondary | ICD-10-CM | POA: Insufficient documentation

## 2023-05-11 DIAGNOSIS — H8113 Benign paroxysmal vertigo, bilateral: Secondary | ICD-10-CM

## 2023-05-11 DIAGNOSIS — J302 Other seasonal allergic rhinitis: Secondary | ICD-10-CM | POA: Diagnosis not present

## 2023-05-11 DIAGNOSIS — H6993 Unspecified Eustachian tube disorder, bilateral: Secondary | ICD-10-CM

## 2023-05-11 DIAGNOSIS — Z1211 Encounter for screening for malignant neoplasm of colon: Secondary | ICD-10-CM | POA: Diagnosis not present

## 2023-05-11 DIAGNOSIS — I10 Essential (primary) hypertension: Secondary | ICD-10-CM

## 2023-05-11 DIAGNOSIS — E559 Vitamin D deficiency, unspecified: Secondary | ICD-10-CM | POA: Diagnosis not present

## 2023-05-11 DIAGNOSIS — E039 Hypothyroidism, unspecified: Secondary | ICD-10-CM

## 2023-05-11 DIAGNOSIS — Z780 Asymptomatic menopausal state: Secondary | ICD-10-CM | POA: Diagnosis not present

## 2023-05-11 MED ORDER — AZELASTINE-FLUTICASONE 137-50 MCG/ACT NA SUSP: 1.0000 | Freq: Two times a day (BID) | NASAL | 0 refills | Status: DC

## 2023-05-11 NOTE — Assessment & Plan Note (Signed)
Due for repeat colon cancer screening; agreeable to colo guard at this time

## 2023-05-11 NOTE — Assessment & Plan Note (Signed)
Chronic, repeat LP The 10-year ASCVD risk score (Arnett DK, et al., 2019) is: 9.8% On zetia 10 mg given hx of statin myalgia

## 2023-05-11 NOTE — Assessment & Plan Note (Signed)
Chronic, stable Body mass index is 42.01 kg/m. Continue to recommend balanced, lower carb meals. Smaller meal size, adding snacks. Choosing water as drink of choice and increasing purposeful exercise.

## 2023-05-11 NOTE — Assessment & Plan Note (Signed)
Chronic, previously with low TSH No symptoms; was alternating between 150/175 mcg synthroid Repeat TSH

## 2023-05-11 NOTE — Assessment & Plan Note (Signed)
Chronic, improved Continues on estrace at 1 mg daily

## 2023-05-11 NOTE — Assessment & Plan Note (Signed)
Chronic, ongoing but improved Unclear if meclizine assisted No longer nauseous  Continue to monitor Repeat labs today

## 2023-05-11 NOTE — Assessment & Plan Note (Signed)
Previously overcorrected; repeat labs given BPPV concerns

## 2023-05-11 NOTE — Progress Notes (Signed)
Established patient visit  Patient: Tammy Bailey   DOB: 07-10-1955   67 y.o. Female  MRN: [id number] Visit Date: 05/11/2023  Today's healthcare provider: Jacky Kindle, FNP  Re Introduced to nurse practitioner role and practice setting.  All questions answered.  Discussed provider/patient relationship and expectations.  Chief Complaint  Patient presents with   Follow-up    Pt stated--still have dizziness while sitting or turning.   Subjective    HPI HPI     Follow-up    Additional comments: Pt stated--still have dizziness while sitting or turning.      Last edited by Shelly Bombard, CMA on 05/11/2023  3:21 PM.      Medications: Outpatient Medications Prior to Visit  Medication Sig   amLODipine (NORVASC) 5 MG tablet Take 1 tablet (5 mg total) by mouth daily.   co-enzyme Q-10 30 MG capsule Take 7 capsules (210 mg total) by mouth 3 (three) times daily.   esomeprazole (NEXIUM) 20 MG packet Take 20 mg by mouth as needed.   estradiol (ESTRACE) 1 MG tablet TAKE 1 TABLET BY MOUTH ONCE DAILY   ezetimibe (ZETIA) 10 MG tablet Take 1 tablet (10 mg total) by mouth daily.   levothyroxine (SYNTHROID) 150 MCG tablet Take 1 tablet (150 mcg total) by mouth 3 (three) times a week.   levothyroxine (SYNTHROID) 175 MCG tablet TAKE 1 TABLET BY MOUTH ONCE DAILY ON AN EMPTY STOMACH. WAIT 30 MINUTES BEFORE TAKING OTHER MEDS.   meclizine (ANTIVERT) 25 MG tablet Take 1 tablet (25 mg total) by mouth 3 (three) times daily as needed for dizziness.   ondansetron (ZOFRAN) 4 MG tablet Take 1 tablet (4 mg total) by mouth every 8 (eight) hours as needed for nausea or vomiting.   valsartan-hydrochlorothiazide (DIOVAN-HCT) 160-25 MG tablet Take 1 tablet by mouth daily.   No facility-administered medications prior to visit.    Review of Systems Last CBC Lab Results  Component Value Date   WBC 6.3 08/06/2022   HGB 14.0 08/06/2022   HCT 41.1 08/06/2022   MCV 89 08/06/2022   MCH 30.3 08/06/2022   RDW  12.0 08/06/2022   PLT 367 08/06/2022   Last metabolic panel Lab Results  Component Value Date   GLUCOSE 86 11/25/2022   NA 139 11/25/2022   K 4.4 11/25/2022   CL 104 11/25/2022   CO2 24 11/25/2022   BUN 16 11/25/2022   CREATININE 0.61 11/25/2022   EGFR 97 11/25/2022   CALCIUM 9.4 11/25/2022   PROT 7.1 07/31/2021   ALBUMIN 4.5 07/31/2021   LABGLOB 2.6 07/31/2021   AGRATIO 1.7 07/31/2021   BILITOT 0.4 07/31/2021   ALKPHOS 107 07/31/2021   AST 16 07/31/2021   ALT 15 07/31/2021   Last lipids Lab Results  Component Value Date   CHOL 210 (H) 08/06/2022   HDL 66 08/06/2022   LDLCALC 130 (H) 08/06/2022   TRIG 79 08/06/2022   CHOLHDL 3.2 08/06/2022   Last hemoglobin A1c Lab Results  Component Value Date   HGBA1C 5.4 02/21/2021   Last thyroid functions Lab Results  Component Value Date   TSH 0.026 (L) 11/25/2022   T4TOTAL 10.3 11/16/2019   Last vitamin D Lab Results  Component Value Date   VD25OH 50.5 02/21/2021   Last vitamin B12 and Folate Lab Results  Component Value Date   VITAMINB12 1,402 (H) 07/23/2020     Objective    BP 127/77   Pulse 72   Ht 4\' 11"  (1.499  m)   Wt 208 lb (94.3 kg)   SpO2 97%   BMI 42.01 kg/m   BP Readings from Last 3 Encounters:  05/11/23 127/77  04/16/23 107/61  03/19/23 131/70   Wt Readings from Last 3 Encounters:  05/11/23 208 lb (94.3 kg)  04/16/23 210 lb 3.2 oz (95.3 kg)  03/19/23 204 lb 14.4 oz (92.9 kg)   SpO2 Readings from Last 3 Encounters:  05/11/23 97%  04/16/23 100%  03/19/23 98%   Physical Exam Vitals and nursing note reviewed.  Constitutional:      General: She is not in acute distress.    Appearance: Normal appearance. She is obese. She is not ill-appearing, toxic-appearing or diaphoretic.  HENT:     Head: Normocephalic and atraumatic.     Right Ear: Tympanic membrane, ear canal and external ear normal.     Left Ear: Tympanic membrane, ear canal and external ear normal.     Nose: Nose normal.      Mouth/Throat:     Mouth: Mucous membranes are moist.     Pharynx: Oropharynx is clear. No oropharyngeal exudate or posterior oropharyngeal erythema.  Eyes:     Conjunctiva/sclera: Conjunctivae normal.  Cardiovascular:     Rate and Rhythm: Normal rate and regular rhythm.     Pulses: Normal pulses.     Heart sounds: Normal heart sounds. No murmur heard.    No friction rub. No gallop.  Pulmonary:     Effort: Pulmonary effort is normal. No respiratory distress.     Breath sounds: Normal breath sounds. No stridor. No wheezing, rhonchi or rales.  Chest:     Chest wall: No tenderness.  Musculoskeletal:        General: No swelling, tenderness, deformity or signs of injury. Normal range of motion.     Cervical back: Normal range of motion and neck supple.     Right lower leg: No edema.     Left lower leg: No edema.  Skin:    General: Skin is warm and dry.     Capillary Refill: Capillary refill takes less than 2 seconds.     Coloration: Skin is not jaundiced or pale.     Findings: No bruising, erythema, lesion or rash.  Neurological:     General: No focal deficit present.     Mental Status: She is alert and oriented to person, place, and time. Mental status is at baseline.     Cranial Nerves: No cranial nerve deficit.     Sensory: No sensory deficit.     Motor: No weakness.     Coordination: Coordination normal.  Psychiatric:        Mood and Affect: Mood normal.        Behavior: Behavior normal.        Thought Content: Thought content normal.        Judgment: Judgment normal.    No results found for any visits on 05/11/23.  Assessment & Plan     Problem List Items Addressed This Visit       Cardiovascular and Mediastinum   Primary hypertension    Chronic, borderline Continue dietary and exercise plans to assist medications Goal remains 119/79 Continue norvasc 5 mg Continue diovan 160-25      Relevant Orders   CBC with Differential/Platelet   Comprehensive Metabolic  Panel (CMET)   TSH     Endocrine   Adult hypothyroidism    Chronic, previously with low TSH No symptoms; was alternating between 150/175  mcg synthroid Repeat TSH      Relevant Orders   TSH     Nervous and Auditory   Benign paroxysmal positional vertigo due to bilateral vestibular disorder    Chronic, ongoing but improved Unclear if meclizine assisted No longer nauseous  Continue to monitor Repeat labs today       Eustachian tube dysfunction, bilateral - Primary    Chronic, stable No fluid on exam Continue azel-flonase to assist F/u as needed       Relevant Medications   Azelastine-Fluticasone 137-50 MCG/ACT SUSP     Other   Avitaminosis D    Chronic, repeat labs given ongoing BPPV concerns       Relevant Orders   Vitamin D (25 hydroxy)   Disorder of vitamin B12    Previously overcorrected; repeat labs given BPPV concerns       Relevant Orders   B12 and Folate Panel   Mixed hyperlipidemia    Chronic, repeat LP The 10-year ASCVD risk score (Arnett DK, et al., 2019) is: 9.8% On zetia 10 mg given hx of statin myalgia       Relevant Orders   Lipid panel   Morbid obesity (HCC)    Chronic, stable Body mass index is 42.01 kg/m. Continue to recommend balanced, lower carb meals. Smaller meal size, adding snacks. Choosing water as drink of choice and increasing purposeful exercise.       Post-menopausal    Chronic, improved Continues on estrace at 1 mg daily      Prediabetes    Chronic, stable Repeat A1c Continue to recommend balanced, lower carb meals. Smaller meal size, adding snacks. Choosing water as drink of choice and increasing purposeful exercise.       Relevant Orders   Hemoglobin A1c   Screen for colon cancer    Due for repeat colon cancer screening; agreeable to colo guard at this time       Relevant Orders   Cologuard   Seasonal allergies    Chronic, variable Continue OTC antihistamine  Add azel-flonase to assist       Return  if symptoms worsen or fail to improve.     Leilani Merl, FNP, have reviewed all documentation for this visit. The documentation on 05/11/23 for the exam, diagnosis, procedures, and orders are all accurate and complete.  Jacky Kindle, FNP  [facility] [phone] (phone) [phone] (fax)  [facility]

## 2023-05-11 NOTE — Assessment & Plan Note (Signed)
Chronic, repeat labs given ongoing BPPV concerns

## 2023-05-11 NOTE — Assessment & Plan Note (Signed)
Chronic, stable Repeat A1c Continue to recommend balanced, lower carb meals. Smaller meal size, adding snacks. Choosing water as drink of choice and increasing purposeful exercise.

## 2023-05-11 NOTE — Assessment & Plan Note (Signed)
Chronic, borderline Continue dietary and exercise plans to assist medications Goal remains 119/79 Continue norvasc 5 mg Continue diovan 160-25

## 2023-05-11 NOTE — Assessment & Plan Note (Signed)
Chronic, stable No fluid on exam Continue azel-flonase to assist F/u as needed

## 2023-05-11 NOTE — Assessment & Plan Note (Signed)
Chronic, variable Continue OTC antihistamine  Add azel-flonase to assist

## 2023-05-11 NOTE — Patient Instructions (Signed)
Please call Main: [phone] and schedule your mammogram and DEXA screening at [facility]  [address], [address] [facility] [place],  [place]  [address]

## 2023-05-12 ENCOUNTER — Other Ambulatory Visit: Payer: Self-pay

## 2023-05-12 ENCOUNTER — Other Ambulatory Visit: Payer: Self-pay | Admitting: Family Medicine

## 2023-05-12 LAB — CBC WITH DIFFERENTIAL/PLATELET
Basophils Absolute: 0.1 10*3/uL (ref 0.0–0.2)
Basos: 1 %
EOS (ABSOLUTE): 0.3 10*3/uL (ref 0.0–0.4)
Eos: 3 %
Hematocrit: 39.7 % (ref 34.0–46.6)
Hemoglobin: 13.6 g/dL (ref 11.1–15.9)
Immature Grans (Abs): 0 10*3/uL (ref 0.0–0.1)
Immature Granulocytes: 0 %
Lymphocytes Absolute: 2.4 10*3/uL (ref 0.7–3.1)
Lymphs: 30 %
MCH: 30.2 pg (ref 26.6–33.0)
MCHC: 34.3 g/dL (ref 31.5–35.7)
MCV: 88 fL (ref 79–97)
Monocytes Absolute: 0.7 10*3/uL (ref 0.1–0.9)
Monocytes: 9 %
Neutrophils Absolute: 4.4 10*3/uL (ref 1.4–7.0)
Neutrophils: 57 %
Platelets: 417 10*3/uL (ref 150–450)
RBC: 4.5 x10E6/uL (ref 3.77–5.28)
RDW: 11.8 % (ref 11.7–15.4)
WBC: 7.9 10*3/uL (ref 3.4–10.8)

## 2023-05-12 LAB — COMPREHENSIVE METABOLIC PANEL
ALT: 14 [IU]/L (ref 0–32)
AST: 15 [IU]/L (ref 0–40)
Albumin: 4.5 g/dL (ref 3.9–4.9)
Alkaline Phosphatase: 111 [IU]/L (ref 44–121)
BUN/Creatinine Ratio: 22 (ref 12–28)
BUN: 17 mg/dL (ref 8–27)
Bilirubin Total: 0.3 mg/dL (ref 0.0–1.2)
CO2: 21 mmol/L (ref 20–29)
Calcium: 9.8 mg/dL (ref 8.7–10.3)
Chloride: 99 mmol/L (ref 96–106)
Creatinine, Ser: 0.76 mg/dL (ref 0.57–1.00)
Globulin, Total: 2.7 g/dL (ref 1.5–4.5)
Glucose: 93 mg/dL (ref 70–99)
Potassium: 4.5 mmol/L (ref 3.5–5.2)
Sodium: 137 mmol/L (ref 134–144)
Total Protein: 7.2 g/dL (ref 6.0–8.5)
eGFR: 85 mL/min/{1.73_m2} (ref >59)

## 2023-05-12 LAB — HEMOGLOBIN A1C
Est. average glucose Bld gHb Est-mCnc: 111 mg/dL
Hgb A1c MFr Bld: 5.5 % (ref 4.8–5.6)

## 2023-05-12 LAB — LIPID PANEL
Chol/HDL Ratio: 3 {ratio} (ref 0.0–4.4)
Cholesterol, Total: 190 mg/dL (ref 100–199)
HDL: 64 mg/dL (ref >39)
LDL Chol Calc (NIH): 101 mg/dL — ABNORMAL HIGH (ref 0–99)
Triglycerides: 145 mg/dL (ref 0–149)
VLDL Cholesterol Cal: 25 mg/dL (ref 5–40)

## 2023-05-12 LAB — B12 AND FOLATE PANEL
Folate: 12.2 ng/mL (ref >3.0)
Vitamin B-12: 629 pg/mL (ref 232–1245)

## 2023-05-12 LAB — TSH: TSH: 0.259 u[IU]/mL — ABNORMAL LOW (ref 0.450–4.500)

## 2023-05-12 LAB — VITAMIN D 25 HYDROXY (VIT D DEFICIENCY, FRACTURES): Vit D, 25-Hydroxy: 28 ng/mL — ABNORMAL LOW (ref 30.0–100.0)

## 2023-05-12 MED ORDER — LEVOTHYROXINE SODIUM 150 MCG PO TABS: 150.0000 ug | ORAL_TABLET | Freq: Every day | ORAL | 4 refills | Status: AC

## 2023-05-12 NOTE — Telephone Encounter (Signed)
Requested medication (s) are due for refill today: Yes  Requested medication (s) are on the active medication list: Yes  Last refill:  04/05/23 #30, 0RF  Future visit scheduled: Yes  Notes to clinic:  Unable to refill per protocol, cannot delegate.      Requested Prescriptions  Pending Prescriptions Disp Refills   meclizine (ANTIVERT) 25 MG tablet 30 tablet 0    Sig: Take 1 tablet (25 mg total) by mouth 3 (three) times daily as needed for dizziness.     Not Delegated - Gastroenterology: Antiemetics Failed - 05/12/2023  4:18 PM      Failed - This refill cannot be delegated      Passed - Valid encounter within last 6 months    Recent Outpatient Visits           Yesterday Eustachian tube dysfunction, bilateral   [facility] Jacky Kindle, FNP   3 weeks ago Dizziness   [place] [facility] [place], [place], PA-C   1 month ago Benign paroxysmal positional vertigo, unspecified laterality   [facility] [place], Marzella Schlein, MD   4 months ago Primary hypertension   [place] [facility] Jacky Kindle, FNP   5 months ago Primary hypertension    [facility] Jacky Kindle, FNP       Future Appointments             In 3 months Jacky Kindle, FNP [facility]

## 2023-05-12 NOTE — Telephone Encounter (Signed)
Requested medication (s) are due for refill today - yes  Requested medication (s) are on the active medication list -yes  Future visit scheduled -yes  Last refill: 04/05/23 #30  Notes to clinic: non delegated Rx  Requested Prescriptions  Pending Prescriptions Disp Refills   meclizine (ANTIVERT) 25 MG tablet 30 tablet 0    Sig: Take 1 tablet (25 mg total) by mouth 3 (three) times daily as needed for dizziness.     Not Delegated - Gastroenterology: Antiemetics Failed - 05/12/2023  4:18 PM      Failed - This refill cannot be delegated      Passed - Valid encounter within last 6 months    Recent Outpatient Visits           Yesterday Eustachian tube dysfunction, bilateral   [place] [facility] Jacky Kindle, FNP   3 weeks ago Dizziness   [place] [facility] [place], Lillie, PA-C   1 month ago Benign paroxysmal positional vertigo, unspecified laterality   [facility] [place], Marzella Schlein, MD   4 months ago Primary hypertension   [place] [facility] Jacky Kindle, FNP   5 months ago Primary hypertension   [place] [facility] Merita Norton T, FNP       Future Appointments             In 3 months Jacky Kindle, FNP [facility]               Requested Prescriptions  Pending Prescriptions Disp Refills   meclizine (ANTIVERT) 25 MG tablet 30 tablet 0    Sig: Take 1 tablet (25 mg total) by mouth 3 (three) times daily as needed for dizziness.     Not Delegated - Gastroenterology: Antiemetics Failed - 05/12/2023  4:18 PM      Failed - This refill cannot be delegated      Passed - Valid encounter within last 6 months    Recent Outpatient Visits           Yesterday Eustachian tube dysfunction, bilateral   [facility] Jacky Kindle, FNP   3 weeks ago Dizziness   [place] [facility] [place],  [place], PA-C   1 month ago Benign paroxysmal positional vertigo, unspecified laterality   [facility] [place], Marzella Schlein, MD   4 months ago Primary hypertension   [place] [facility] Jacky Kindle, FNP   5 months ago Primary hypertension   [place] [facility] Jacky Kindle, FNP       Future Appointments             In 3 months Jacky Kindle, FNP [facility], [facility]

## 2023-05-12 NOTE — Telephone Encounter (Signed)
Pt given lab results per notes of Robynn Pane, FNP on 05/12/23. Pt verbalized understanding. Patient requested a refill be sent to the pharmacy for Meclizine be sent to the pharmacy as well as discussed at visit. Cholesterol is improved; however, remains elevated. The 10-year ASCVD risk score (Arnett DK, et al., 2019) is: 9.5%   Recommend 5000 IU Vit D daily; available OTC.   TSH remains low despite increase; recommend restart of 150 daily.  Written by Jacky Kindle, FNP on 05/12/2023  8:42 AM EST

## 2023-05-14 ENCOUNTER — Other Ambulatory Visit: Payer: Self-pay | Admitting: Family Medicine

## 2023-05-18 MED ORDER — MECLIZINE HCL 25 MG PO TABS: 25.0000 mg | ORAL_TABLET | Freq: Three times a day (TID) | ORAL | 0 refills | Status: DC | PRN

## 2023-05-18 NOTE — Telephone Encounter (Signed)
Requested medication (s) are due for refill today: yes  Requested medication (s) are on the active medication list: yes  Last refill:  04/05/23 #30  Future visit scheduled: yes  Notes to clinic:  med not delegated to NT to RF   Requested Prescriptions  Pending Prescriptions Disp Refills   meclizine (ANTIVERT) 25 MG tablet [Pharmacy Med Name: MECLIZINE HCL 25 MG TAB] 30 tablet 0    Sig: TAKE 1 TABLET BY MOUTH 3 TIMES DAILY AS NEEDED FOR DIZZINESS     Not Delegated - Gastroenterology: Antiemetics Failed - 05/14/2023  9:48 AM      Failed - This refill cannot be delegated      Passed - Valid encounter within last 6 months    Recent Outpatient Visits           1 week ago Eustachian tube dysfunction, bilateral   [place] [facility] Jacky Kindle, FNP   1 month ago Dizziness   [place] [facility] [place], [place], PA-C   2 months ago Benign paroxysmal positional vertigo, unspecified laterality   [facility] [place], Marzella Schlein, MD   4 months ago Primary hypertension   [place] Merita Norton T, FNP   5 months ago Primary hypertension   [place] [facility] Jacky Kindle, FNP       Future Appointments             In 2 months Jacky Kindle, FNP [facility]

## 2023-06-04 ENCOUNTER — Telehealth: Payer: Self-pay | Admitting: Family Medicine

## 2023-06-16 DIAGNOSIS — M7661 Achilles tendinitis, right leg: Secondary | ICD-10-CM

## 2023-06-16 HISTORY — DX: Achilles tendinitis, right leg: M76.61

## 2023-06-30 ENCOUNTER — Ambulatory Visit: Payer: 59 | Admitting: Physician Assistant

## 2023-06-30 ENCOUNTER — Encounter: Payer: Self-pay | Admitting: Physician Assistant

## 2023-06-30 VITALS — BP 128/70 | HR 83 | Resp 16 | Ht 59.0 in | Wt 209.0 lb

## 2023-06-30 DIAGNOSIS — R058 Other specified cough: Secondary | ICD-10-CM | POA: Diagnosis not present

## 2023-06-30 DIAGNOSIS — R197 Diarrhea, unspecified: Secondary | ICD-10-CM

## 2023-06-30 DIAGNOSIS — J101 Influenza due to other identified influenza virus with other respiratory manifestations: Secondary | ICD-10-CM

## 2023-06-30 DIAGNOSIS — R6889 Other general symptoms and signs: Secondary | ICD-10-CM

## 2023-06-30 LAB — POCT INFLUENZA A/B
Influenza A, POC: POSITIVE — AB
Influenza B, POC: NEGATIVE

## 2023-06-30 MED ORDER — OSELTAMIVIR PHOSPHATE 75 MG PO CAPS: 75.0000 mg | ORAL_CAPSULE | Freq: Two times a day (BID) | ORAL | 0 refills | Status: AC

## 2023-06-30 NOTE — Progress Notes (Signed)
Acute Office Visit   Patient: Tammy Bailey   DOB: 1955-09-23   68 y.o. Female  MRN: [id number] Visit Date: 06/30/2023  Today's healthcare provider: Oswaldo Conroy Kailey Esquilin, PA-C  Introduced myself to the patient as a Secondary school teacher and provided education on APPs in clinical practice.    Chief Complaint  Patient presents with   Cough    x2 days, productive   Nasal Congestion   Fatigue   Subjective    HPI HPI     Cough    Additional comments: x2 days, productive      Last edited by Dollene Primrose, CMA on 06/30/2023 11:00 AM.      URI -type symptoms   Onset: sudden  Duration: ongoing since Monday  Associated symptoms: she reports predominantly dry coughing, some chest soreness with coughing, fatigue, ear fullness, she reports some soft to liquid stools   Intervention: Nyquil and Delsym      Medications: Outpatient Medications Prior to Visit  Medication Sig   amLODipine (NORVASC) 5 MG tablet Take 1 tablet (5 mg total) by mouth daily.   Azelastine-Fluticasone 137-50 MCG/ACT SUSP Place 1 spray into the nose every 12 (twelve) hours.   co-enzyme Q-10 30 MG capsule Take 7 capsules (210 mg total) by mouth 3 (three) times daily.   esomeprazole (NEXIUM) 20 MG packet Take 20 mg by mouth as needed.   estradiol (ESTRACE) 1 MG tablet TAKE 1 TABLET BY MOUTH ONCE DAILY   ezetimibe (ZETIA) 10 MG tablet Take 1 tablet (10 mg total) by mouth daily.   levothyroxine (SYNTHROID) 150 MCG tablet Take 1 tablet (150 mcg total) by mouth daily before breakfast.   meclizine (ANTIVERT) 25 MG tablet Take 1 tablet (25 mg total) by mouth 3 (three) times daily as needed for dizziness.   ondansetron (ZOFRAN) 4 MG tablet Take 1 tablet (4 mg total) by mouth every 8 (eight) hours as needed for nausea or vomiting.   valsartan-hydrochlorothiazide (DIOVAN-HCT) 160-25 MG tablet Take 1 tablet by mouth daily.   No facility-administered medications prior to visit.    Review of Systems  Constitutional:  Positive  for chills and fatigue.  HENT:  Positive for congestion, sinus pressure and sinus pain. Negative for ear pain, postnasal drip and sore throat.   Respiratory:  Positive for cough, chest tightness and wheezing (intermittent). Negative for shortness of breath.   Gastrointestinal:  Positive for diarrhea. Negative for nausea and vomiting.  Musculoskeletal:  Negative for myalgias.  Neurological:  Positive for dizziness.        Objective    BP 128/70   Pulse 83   Resp 16   Ht 4\' 11"  (1.499 m)   Wt 209 lb (94.8 kg)   SpO2 99%   BMI 42.21 kg/m     Physical Exam Vitals reviewed.  Constitutional:      General: She is awake.     Appearance: She is well-developed and well-groomed. She is ill-appearing.  HENT:     Head: Normocephalic and atraumatic.     Mouth/Throat:     Lips: Pink.     Mouth: Mucous membranes are moist.     Pharynx: Uvula midline. No pharyngeal swelling, oropharyngeal exudate, posterior oropharyngeal erythema, uvula swelling or postnasal drip.  Eyes:     General: Lids are normal. Gaze aligned appropriately.  Cardiovascular:     Rate and Rhythm: Normal rate and regular rhythm.     Heart sounds: Normal heart sounds.  Pulmonary:  Effort: Pulmonary effort is normal.     Breath sounds: Normal breath sounds. No decreased air movement. No decreased breath sounds, wheezing, rhonchi or rales.  Musculoskeletal:     Cervical back: Normal range of motion and neck supple.  Lymphadenopathy:     Head:     Right side of head: No submental or submandibular adenopathy.     Left side of head: Submandibular adenopathy present. No submental adenopathy.     Cervical: No cervical adenopathy.     Right cervical: No superficial cervical adenopathy.    Left cervical: No superficial cervical adenopathy.     Upper Body:     Right upper body: No supraclavicular adenopathy.     Left upper body: No supraclavicular adenopathy.  Neurological:     Mental Status: She is alert.   Psychiatric:        Behavior: Behavior is cooperative.       Results for orders placed or performed in visit on 06/30/23  POCT Influenza A/B  Result Value Ref Range   Influenza A, POC Positive (A) Negative   Influenza B, POC Negative Negative   Results of flu swab reviewed with patient during apt   Assessment & Plan      No follow-ups on file.     Problem List Items Addressed This Visit   None Visit Diagnoses       Influenza A    -  Primary   Relevant Medications   oseltamivir (TAMIFLU) 75 MG capsule     Flu-like symptoms       Relevant Orders   POCT Influenza A/B (Completed)      Acute, new concern Patient reports that she started having dry cough along with chest soreness, diarrhea and ear fullness on Monday. She reports that the symptoms have continued into today's appointment She was tested for influenza and COVID.  Her COVID testing was positive for influenza A.  She was informed of the results and her COVID testing was canceled She is within the window for Tamiflu and after discussing potential side effects as well as mechanism of action she is amenable to starting this to help treat her symptoms Reviewed that she can continue over-the-counter medications to help with further symptomatic relief Reviewed ED and return precautions Follow-up as needed for progressing or persistent symptoms  No follow-ups on file.   I, Velta Rockholt E Airlie Blumenberg, PA-C, have reviewed all documentation for this visit. The documentation on 07/02/23 for the exam, diagnosis, procedures, and orders are all accurate and complete.   Jacquelin Hawking, MHS, PA-C [facility]

## 2023-06-30 NOTE — Patient Instructions (Addendum)
Your testing was positive for Influenza A  I have sent in a script for Tamiflu to treat this. Please start it as soon as possible for maximum benefit  It can take a few days for the antiviral to kick in so I recommend symptomatic relief with over the counter medication such as the following: Dayquil/ Nyquil Theraflu Alkaseltzer  Coricidin - if you have high blood pressure even if it is well managed with medications  These medications typically have Tylenol in them already so you can take Ibuprofen as needed for further pain and discomfort and fever management  Stay well hydrated with at least 75 oz of water per day to help with recovery  If you notice any of the following please let us know: increased fever not responding to Tylenol or Ibuprofen, swelling around your nose or eyes, difficulty seeing,

## 2023-07-13 DIAGNOSIS — Z1211 Encounter for screening for malignant neoplasm of colon: Secondary | ICD-10-CM | POA: Diagnosis not present

## 2023-07-21 LAB — COLOGUARD: COLOGUARD: NEGATIVE

## 2023-08-09 ENCOUNTER — Telehealth: Payer: Self-pay | Admitting: Family Medicine

## 2023-08-09 NOTE — Telephone Encounter (Signed)
 Left message for patient that 2/25 appt is has to cancel due to provider not available and we would like to reschedule for another day or with different provider

## 2023-08-10 ENCOUNTER — Encounter: Payer: Self-pay | Admitting: Family Medicine

## 2023-08-10 ENCOUNTER — Encounter: Payer: 59 | Admitting: Family Medicine

## 2023-08-10 ENCOUNTER — Ambulatory Visit (INDEPENDENT_AMBULATORY_CARE_PROVIDER_SITE_OTHER): Payer: 59 | Admitting: Family Medicine

## 2023-08-10 VITALS — BP 138/56 | HR 65 | Ht 59.0 in | Wt 209.1 lb

## 2023-08-10 DIAGNOSIS — Z1231 Encounter for screening mammogram for malignant neoplasm of breast: Secondary | ICD-10-CM

## 2023-08-10 DIAGNOSIS — Z0001 Encounter for general adult medical examination with abnormal findings: Secondary | ICD-10-CM

## 2023-08-10 DIAGNOSIS — Z0000 Encounter for general adult medical examination without abnormal findings: Secondary | ICD-10-CM

## 2023-08-10 DIAGNOSIS — E782 Mixed hyperlipidemia: Secondary | ICD-10-CM | POA: Diagnosis not present

## 2023-08-10 DIAGNOSIS — R42 Dizziness and giddiness: Secondary | ICD-10-CM | POA: Diagnosis not present

## 2023-08-10 DIAGNOSIS — R7303 Prediabetes: Secondary | ICD-10-CM | POA: Diagnosis not present

## 2023-08-10 DIAGNOSIS — F321 Major depressive disorder, single episode, moderate: Secondary | ICD-10-CM

## 2023-08-10 DIAGNOSIS — Z6841 Body Mass Index (BMI) 40.0 and over, adult: Secondary | ICD-10-CM | POA: Diagnosis not present

## 2023-08-10 DIAGNOSIS — I10 Essential (primary) hypertension: Secondary | ICD-10-CM | POA: Diagnosis not present

## 2023-08-10 DIAGNOSIS — Z13820 Encounter for screening for osteoporosis: Secondary | ICD-10-CM

## 2023-08-10 DIAGNOSIS — E039 Hypothyroidism, unspecified: Secondary | ICD-10-CM | POA: Diagnosis not present

## 2023-08-10 MED ORDER — SERTRALINE HCL 25 MG PO TABS: 25.0000 mg | ORAL_TABLET | Freq: Every day | ORAL | 3 refills | Status: DC

## 2023-08-10 MED ORDER — VALSARTAN-HYDROCHLOROTHIAZIDE 160-25 MG PO TABS: 1.0000 | ORAL_TABLET | Freq: Every day | ORAL | 3 refills | Status: AC

## 2023-08-10 MED ORDER — MECLIZINE HCL 25 MG PO TABS: 25.0000 mg | ORAL_TABLET | Freq: Three times a day (TID) | ORAL | 0 refills | Status: AC | PRN

## 2023-08-10 MED ORDER — EZETIMIBE 10 MG PO TABS: 10.0000 mg | ORAL_TABLET | Freq: Every day | ORAL | 3 refills | Status: AC

## 2023-08-10 MED ORDER — AMLODIPINE BESYLATE 5 MG PO TABS: 5.0000 mg | ORAL_TABLET | Freq: Every day | ORAL | 3 refills | Status: DC

## 2023-08-10 NOTE — Progress Notes (Signed)
 Complete physical exam  Patient: Tammy Bailey   DOB: [birth year]   68 y.o. Female  MRN: [id number]  Introduced to nurse practitioner role and practice setting.  All questions answered.  Discussed provider/patient relationship and expectations.   Subjective:    Chief Complaint  Patient presents with   Annual Exam    Tammy Bailey is a 68 y.o. female who presents today for a complete physical exam. She reports consuming a general diet. The patient does not participate in regular exercise at present. She generally feels fairly well. She reports sleeping well. She does have additional problems to discuss today.   She experiences episodic dizziness, lasting less than a minute, occurring more frequently than before. Dramamine provides limited relief. The dizziness has changed since having the flu a few weeks ago.  She is raising her great-granddaughter, who is almost two years old, and feels mentally and physically drained due to family stressors. She describes feeling 'crap' and attributes some of her emotional strain to the situation at home with her granddaughter's mother.  Most recent fall risk assessment:    05/11/2023    3:24 PM  Fall Risk   Falls in the past year? 0  Number falls in past yr: 0  Injury with Fall? 0     Most recent depression screenings:    08/10/2023    9:55 AM 05/11/2023    3:24 PM  PHQ 2/9 Scores  PHQ - 2 Score 5 4  PHQ- 9 Score 15 10      08/10/2023    9:55 AM 05/11/2023    3:24 PM 04/16/2023    3:57 PM 02/01/2020    5:49 PM  GAD 7 : Generalized Anxiety Score  Nervous, Anxious, on Edge 3 3 3 3   Control/stop worrying 2 3 3 3   Worry too much - different things 3 3 3 3   Trouble relaxing 3 1 3  0  Restless 2 3 2 2   Easily annoyed or irritable 3 3 3 2   Afraid - awful might happen 0 2 3 2   Total GAD 7 Score 16 18 20 15   Anxiety Difficulty Somewhat difficult  Very difficult Very difficult      Vision:R cataract and astigmatism in L - next month   and Dental: No current dental problems and No regular dental care   Patient Active Problem List   Diagnosis Date Noted   Dizziness 08/10/2023   Disorder of vitamin B12 05/11/2023   Avitaminosis D 05/11/2023   Prediabetes 05/11/2023   Mixed hyperlipidemia 05/11/2023   Eustachian tube dysfunction, bilateral 05/11/2023   Benign paroxysmal positional vertigo due to bilateral vestibular disorder 05/11/2023   Seasonal allergies 05/11/2023   Morbid obesity (HCC) 11/25/2022   Myalgia due to statin 10/16/2021   Annual physical exam 07/31/2021   Primary hypertension 02/21/2021   Depression, major, single episode, moderate (HCC) 04/02/2015   Post-menopausal 02/19/2015   Adult hypothyroidism 04/21/2002   Past Medical History:  Diagnosis Date   Anxiety    Depression    Hyperlipidemia    Vitamin D deficiency    Past Surgical History:  Procedure Laterality Date   ABDOMINAL HYSTERECTOMY     ACHILLES TENDON REPAIR Left 2024   APPENDECTOMY     BLADDER SURGERY     CARPAL TUNNEL RELEASE     Both   CHOLECYSTECTOMY     KNEE SURGERY Left x's two   RECONSTRUCTION OF NOSE Right 2010   TONSILLECTOMY     Social History  Tobacco Use   Smoking status: Never   Smokeless tobacco: Never  Vaping Use   Vaping status: Never Used  Substance Use Topics   Alcohol use: No   Drug use: No   Allergies  Allergen Reactions   Gabapentin Other (See Comments)    Pt stated she became aggressive and was wanting to fight.    Erythromycin       Patient Care Team: Jacky Kindle, FNP as PCP - General (Family Medicine)   Outpatient Medications Prior to Visit  Medication Sig   estradiol (ESTRACE) 1 MG tablet TAKE 1 TABLET BY MOUTH ONCE DAILY   levothyroxine (SYNTHROID) 150 MCG tablet Take 1 tablet (150 mcg total) by mouth daily before breakfast.   ondansetron (ZOFRAN) 4 MG tablet Take 1 tablet (4 mg total) by mouth every 8 (eight) hours as needed for nausea or vomiting.   [DISCONTINUED] amLODipine  (NORVASC) 5 MG tablet Take 1 tablet (5 mg total) by mouth daily.   [DISCONTINUED] Azelastine-Fluticasone 137-50 MCG/ACT SUSP Place 1 spray into the nose every 12 (twelve) hours.   [DISCONTINUED] co-enzyme Q-10 30 MG capsule Take 7 capsules (210 mg total) by mouth 3 (three) times daily.   [DISCONTINUED] ezetimibe (ZETIA) 10 MG tablet Take 1 tablet (10 mg total) by mouth daily.   [DISCONTINUED] meclizine (ANTIVERT) 25 MG tablet Take 1 tablet (25 mg total) by mouth 3 (three) times daily as needed for dizziness.   [DISCONTINUED] valsartan-hydrochlorothiazide (DIOVAN-HCT) 160-25 MG tablet Take 1 tablet by mouth daily.   esomeprazole (NEXIUM) 20 MG packet Take 20 mg by mouth as needed. (Patient not taking: Reported on 08/10/2023)   No facility-administered medications prior to visit.    Review of Systems  All other systems reviewed and are negative.     Objective:     BP (!) 138/56   Pulse 65   Ht 4\' 11"  (1.499 m)   Wt 209 lb 1.6 oz (94.8 kg)   SpO2 98%   BMI 42.23 kg/m  BP Readings from Last 3 Encounters:  08/10/23 (!) 138/56  06/30/23 128/70  05/11/23 127/77   Wt Readings from Last 3 Encounters:  08/10/23 209 lb 1.6 oz (94.8 kg)  06/30/23 209 lb (94.8 kg)  05/11/23 208 lb (94.3 kg)      Physical Exam Constitutional:      General: She is not in acute distress.    Appearance: Normal appearance. She is obese. She is not ill-appearing, toxic-appearing or diaphoretic.  HENT:     Head: Normocephalic.     Right Ear: A middle ear effusion is present.     Left Ear: A middle ear effusion is present.     Nose: Nose normal.     Mouth/Throat:     Mouth: Mucous membranes are moist.     Dentition: Abnormal dentition.     Tongue: No lesions. Tongue does not deviate from midline.     Pharynx: Oropharynx is clear. No oropharyngeal exudate or posterior oropharyngeal erythema.     Tonsils: No tonsillar exudate.  Eyes:     General: Lids are normal.     Extraocular Movements: Extraocular  movements intact.     Right eye: Normal extraocular motion.     Left eye: Normal extraocular motion.     Conjunctiva/sclera: Conjunctivae normal.     Right eye: Right conjunctiva is not injected.     Left eye: Left conjunctiva is not injected.     Pupils: Pupils are equal, round, and reactive to light.  Neck:     Thyroid: No thyroid mass, thyromegaly or thyroid tenderness.  Cardiovascular:     Rate and Rhythm: Normal rate and regular rhythm.     Pulses: Normal pulses.          Radial pulses are 2+ on the right side and 2+ on the left side.       Dorsalis pedis pulses are 2+ on the right side and 2+ on the left side.       Posterior tibial pulses are 2+ on the right side and 2+ on the left side.     Heart sounds: Normal heart sounds, S1 normal and S2 normal. No murmur heard.    No friction rub. No gallop.  Pulmonary:     Effort: Pulmonary effort is normal. No respiratory distress.     Breath sounds: Normal breath sounds. No stridor. No wheezing, rhonchi or rales.  Chest:     Comments: Denies breast concerns, screening mammo ordered. Abdominal:     General: Bowel sounds are normal. There is no distension.     Palpations: Abdomen is soft. There is no mass.     Tenderness: There is no abdominal tenderness. There is no right CVA tenderness, left CVA tenderness, guarding or rebound.     Hernia: No hernia is present.  Genitourinary:    Comments: Denies concerns today.  Musculoskeletal:        General: No swelling or tenderness. Normal range of motion.     Cervical back: Normal range of motion. No rigidity.     Right lower leg: No edema.     Left lower leg: No edema.  Lymphadenopathy:     Cervical: No cervical adenopathy.     Right cervical: No superficial, deep or posterior cervical adenopathy.    Left cervical: No superficial, deep or posterior cervical adenopathy.  Skin:    General: Skin is warm and dry.     Capillary Refill: Capillary refill takes less than 2 seconds.      Findings: No bruising or erythema.  Neurological:     General: No focal deficit present.     Mental Status: She is alert and oriented to person, place, and time. Mental status is at baseline.     GCS: GCS eye subscore is 4. GCS verbal subscore is 5. GCS motor subscore is 6.     Cranial Nerves: No cranial nerve deficit.     Sensory: No sensory deficit.     Motor: No weakness, tremor or pronator drift.     Coordination: Romberg sign negative. Coordination normal.     Gait: Gait is intact. Gait normal.  Psychiatric:        Attention and Perception: Attention and perception normal.        Mood and Affect: Affect normal. Mood is depressed.        Speech: Speech normal.        Behavior: Behavior normal. Behavior is cooperative.        Thought Content: Thought content normal.        Cognition and Memory: Cognition and memory normal.        Judgment: Judgment normal.      No results found for any visits on 08/10/23.     Assessment & Plan:    Routine Health Maintenance and Physical Exam  Health Maintenance  Topic Date Due   Zoster (Shingles) Vaccine (1 of 2) 08/28/1973   Mammogram  04/11/2015   DTaP/Tdap/Td vaccine (2 - Td or Tdap)  08/28/2019   DEXA scan (bone density measurement)  Never done   Pneumonia Vaccine (2 of 2 - PCV) 07/22/2021   COVID-19 Vaccine (4 - 2024-25 season) 02/14/2023   Flu Shot  09/13/2023*   Cologuard (Stool DNA test)  07/12/2026   Hepatitis C Screening  Completed   HPV Vaccine  Aged Out  *Topic was postponed. The date shown is not the original due date.    Discussed health benefits of physical activity, and encouraged her to engage in regular exercise appropriate for her age and condition.  Annual physical exam Assessment & Plan: Will check labs today Concerns today: Depression, will start Zoloft 25 mg daily Screening mammo ordered Screening Dexa scan ordered  Things to do to keep yourself healthy  - Exercise at least 30-45 minutes a day, 3-4 days a  week.  - Eat a low-fat diet with lots of fruits and vegetables, up to 7-9 servings per day.  - Seatbelts can save your life. Wear them always.  - Smoke detectors on every level of your home, check batteries every year.  - Eye Doctor - have an eye exam every 1-2 years  - Safe sex - if you may be exposed to STDs, use a condom.  - Alcohol -  If you drink, do it moderately, less than 2 drinks per day.  - Health Care Power of Attorney. Choose someone to speak for you if you are not able.  - Depression is common in our stressful world.If you're feeling down or losing interest in things you normally enjoy, please come in for a visit.  - Violence - If anyone is threatening or hurting you, please call immediately.   Orders: -     CBC with Differential/Platelet  Primary hypertension Assessment & Plan: Chronic, elevated borderline, historically controlled Continue Amlodipine 5mg  and Valsartan-Hydrochlorothiazide 160-25mg  Monitor at home with upper arm cuffe Goal<130/80 Low sodium diet Pt increased at home stressed  Orders: -     Comprehensive metabolic panel -     amLODIPine Besylate; Take 1 tablet (5 mg total) by mouth daily.  Dispense: 90 tablet; Refill: 3 -     Valsartan-hydroCHLOROthiazide; Take 1 tablet by mouth daily.  Dispense: 90 tablet; Refill: 3  Adult hypothyroidism Assessment & Plan: Chronic Will recheck TSH - low previous draw Continue levothyroxine daily Pt states hx of lack of tsh control Potential need for endocrine referral in future  Orders: -     TSH + free T4  Morbid obesity (HCC) Assessment & Plan: Continue to make conscious decisions for well balanced diet smaller portions with increase protein, fruits, veggies, water as drink of choice, decrease starches, processed foods, and saturated fats. Increase weekly exercise - 150 minutes per week.     Prediabetes Assessment & Plan: Chronic, stable, last A1c = 5.5 Repeat A1c Continue to recommend balanced,  lower carb meals. Smaller meal size, adding snacks. Choosing water as drink of choice and increasing purposeful exercise.    Orders: -     Hemoglobin A1c  Mixed hyperlipidemia Assessment & Plan: Chronic Continue Zetia daily Lipid panel ordered Hx of statin intolerance  Orders: -     Lipid panel -     Ezetimibe; Take 1 tablet (10 mg total) by mouth daily.  Dispense: 90 tablet; Refill: 3  Screening mammogram for breast cancer -     3D Screening Mammogram, Left and Right; Future  Screening for osteoporosis -     DG Bone Density; Future  Dizziness Assessment &  Plan: Intermittent dizziness, had improved, but has worsened post flu a few weeks ago. Historically as needed antivert - will reorder for pt Recommend daily intranasal spray: azelastine- flonase pt already has for inner ear effusion.     Orders: -     Meclizine HCl; Take 1 tablet (25 mg total) by mouth 3 (three) times daily as needed for dizziness.  Dispense: 30 tablet; Refill: 0  Depression, major, single episode, moderate (HCC) Assessment & Plan: Increased stress at home with family PHQ9=15; GAD7 = 16 Affecting daily life, increased irritability Reports feeling mentally and physically drained.  No history of mood stabilizing medication. -Start Zoloft 25mg  daily. -Schedule follow-up in 4-6 weeks to assess response to medication.  Orders: -     Sertraline HCl; Take 1 tablet (25 mg total) by mouth daily.  Dispense: 30 tablet; Refill: 3    Return in about 4 weeks (around 09/07/2023) for Mood Check - Virtual Visit; may be 4-6 weeks .   I, Sallee Provencal, FNP, have reviewed all documentation for this visit. The documentation on 08/10/23 for the exam, diagnosis, procedures, and orders are all accurate and complete.   Sallee Provencal, FNP

## 2023-08-10 NOTE — Assessment & Plan Note (Signed)
 Chronic, stable, last A1c = 5.5 Repeat A1c Continue to recommend balanced, lower carb meals. Smaller meal size, adding snacks. Choosing water as drink of choice and increasing purposeful exercise.

## 2023-08-10 NOTE — Assessment & Plan Note (Addendum)
 Chronic, elevated borderline, historically controlled Continue Amlodipine 5mg  and Valsartan-Hydrochlorothiazide 160-25mg  Monitor at home with upper arm cuffe Goal<130/80 Low sodium diet Pt increased at home stressed

## 2023-08-10 NOTE — Assessment & Plan Note (Signed)
 Continue to make conscious decisions for well balanced diet smaller portions with increase protein, fruits, veggies, water as drink of choice, decrease starches, processed foods, and saturated fats. Increase weekly exercise - 150 minutes per week.

## 2023-08-10 NOTE — Assessment & Plan Note (Signed)
 Chronic Continue Zetia daily Lipid panel ordered Hx of statin intolerance

## 2023-08-10 NOTE — Assessment & Plan Note (Signed)
 Increased stress at home with family PHQ9=15; GAD7 = 16 Affecting daily life, increased irritability Reports feeling mentally and physically drained.  No history of mood stabilizing medication. -Start Zoloft 25mg  daily. -Schedule follow-up in 4-6 weeks to assess response to medication.

## 2023-08-10 NOTE — Assessment & Plan Note (Signed)
 Chronic Will recheck TSH - low previous draw Continue levothyroxine daily Pt states hx of lack of tsh control Potential need for endocrine referral in future

## 2023-08-10 NOTE — Assessment & Plan Note (Addendum)
 Intermittent dizziness, had improved, but has worsened post flu a few weeks ago. Historically as needed antivert - will reorder for pt Recommend daily intranasal spray: azelastine- flonase pt already has for inner ear effusion.

## 2023-08-10 NOTE — Assessment & Plan Note (Addendum)
 Will check labs today Concerns today: Depression, will start Zoloft 25 mg daily Screening mammo ordered Screening Dexa scan ordered  Things to do to keep yourself healthy  - Exercise at least 30-45 minutes a day, 3-4 days a week.  - Eat a low-fat diet with lots of fruits and vegetables, up to 7-9 servings per day.  - Seatbelts can save your life. Wear them always.  - Smoke detectors on every level of your home, check batteries every year.  - Eye Doctor - have an eye exam every 1-2 years  - Safe sex - if you may be exposed to STDs, use a condom.  - Alcohol -  If you drink, do it moderately, less than 2 drinks per day.  - Health Care Power of Attorney. Choose someone to speak for you if you are not able.  - Depression is common in our stressful world.If you're feeling down or losing interest in things you normally enjoy, please come in for a visit.  - Violence - If anyone is threatening or hurting you, please call immediately.

## 2023-08-11 ENCOUNTER — Telehealth: Payer: Self-pay

## 2023-08-11 ENCOUNTER — Other Ambulatory Visit (HOSPITAL_COMMUNITY): Payer: Self-pay

## 2023-08-11 LAB — COMPREHENSIVE METABOLIC PANEL
ALT: 13 IU/L (ref 0–32)
AST: 18 IU/L (ref 0–40)
Albumin: 4.4 g/dL (ref 3.9–4.9)
Alkaline Phosphatase: 109 IU/L (ref 44–121)
BUN/Creatinine Ratio: 20 (ref 12–28)
BUN: 16 mg/dL (ref 8–27)
Bilirubin Total: 0.5 mg/dL (ref 0.0–1.2)
CO2: 23 mmol/L (ref 20–29)
Calcium: 9.5 mg/dL (ref 8.7–10.3)
Chloride: 101 mmol/L (ref 96–106)
Creatinine, Ser: 0.79 mg/dL (ref 0.57–1.00)
Globulin, Total: 2.8 g/dL (ref 1.5–4.5)
Glucose: 85 mg/dL (ref 70–99)
Potassium: 4.6 mmol/L (ref 3.5–5.2)
Sodium: 141 mmol/L (ref 134–144)
Total Protein: 7.2 g/dL (ref 6.0–8.5)
eGFR: 81 mL/min/{1.73_m2} (ref >59)

## 2023-08-11 LAB — CBC WITH DIFFERENTIAL/PLATELET
Basophils Absolute: 0.1 10*3/uL (ref 0.0–0.2)
Basos: 1 %
EOS (ABSOLUTE): 0.2 10*3/uL (ref 0.0–0.4)
Eos: 3 %
Hematocrit: 40.2 % (ref 34.0–46.6)
Hemoglobin: 13.9 g/dL (ref 11.1–15.9)
Immature Grans (Abs): 0 10*3/uL (ref 0.0–0.1)
Immature Granulocytes: 1 %
Lymphocytes Absolute: 2.2 10*3/uL (ref 0.7–3.1)
Lymphs: 33 %
MCH: 30.5 pg (ref 26.6–33.0)
MCHC: 34.6 g/dL (ref 31.5–35.7)
MCV: 88 fL (ref 79–97)
Monocytes Absolute: 0.7 10*3/uL (ref 0.1–0.9)
Monocytes: 11 %
Neutrophils Absolute: 3.5 10*3/uL (ref 1.4–7.0)
Neutrophils: 51 %
Platelets: 421 10*3/uL (ref 150–450)
RBC: 4.55 x10E6/uL (ref 3.77–5.28)
RDW: 12.1 % (ref 11.7–15.4)
WBC: 6.6 10*3/uL (ref 3.4–10.8)

## 2023-08-11 LAB — LIPID PANEL
Chol/HDL Ratio: 2.8 ratio (ref 0.0–4.4)
Cholesterol, Total: 177 mg/dL (ref 100–199)
HDL: 63 mg/dL (ref >39)
LDL Chol Calc (NIH): 95 mg/dL (ref 0–99)
Triglycerides: 109 mg/dL (ref 0–149)
VLDL Cholesterol Cal: 19 mg/dL (ref 5–40)

## 2023-08-11 LAB — HEMOGLOBIN A1C
Est. average glucose Bld gHb Est-mCnc: 108 mg/dL
Hgb A1c MFr Bld: 5.4 % (ref 4.8–5.6)

## 2023-08-11 LAB — TSH+FREE T4
Free T4: 1.06 ng/dL (ref 0.82–1.77)
TSH: 5.48 u[IU]/mL — ABNORMAL HIGH (ref 0.450–4.500)

## 2023-08-11 NOTE — Telephone Encounter (Unsigned)
 Copied from CRM [id number]. Topic: Clinical - Lab/Test Results >> Aug 11, 2023  2:52 PM Tammy Bailey wrote: Reason for CRM: Pt called in because she received a message saying her results were ready. Pt would like for someone to further explain her results to her and answer questions. Pt callback [phone]

## 2023-08-12 ENCOUNTER — Encounter: Payer: Self-pay | Admitting: Family Medicine

## 2023-08-12 ENCOUNTER — Ambulatory Visit: Payer: Self-pay | Admitting: Family Medicine

## 2023-08-12 ENCOUNTER — Other Ambulatory Visit: Payer: Self-pay | Admitting: Family Medicine

## 2023-08-12 ENCOUNTER — Other Ambulatory Visit: Payer: Self-pay

## 2023-08-12 DIAGNOSIS — E039 Hypothyroidism, unspecified: Secondary | ICD-10-CM

## 2023-08-12 NOTE — Telephone Encounter (Signed)
 Patient advised. See result notes.

## 2023-08-12 NOTE — Telephone Encounter (Addendum)
 Patient outreach has been completed by the office. Routing and closing encounter.   This RN made second attempt to triage patient. No answer. Not able to leave a message. Phone rang and received "this call cannot be completed as dialed" error message. Will route for additional attempts.   This RN made first attempt to triage patient. No answer. Not able to leave a message. Phone rang and received "this call cannot be completed as dialed" error message. Will route for additional attempts.   Copied from CRM [id number]. Topic: Clinical - Lab/Test Results >> Aug 12, 2023 10:32 AM Elle L wrote: Reason for CRM: The patient had more questions regarding her labs. The patient's call back number is [phone] until 3.

## 2023-08-16 ENCOUNTER — Telehealth: Payer: Self-pay | Admitting: Family Medicine

## 2023-08-16 NOTE — Telephone Encounter (Signed)
 [facility] is requesting refill estradiol (ESTRACE) 1 MG tablet  Please advise

## 2023-08-17 ENCOUNTER — Other Ambulatory Visit: Payer: Self-pay | Admitting: Family Medicine

## 2023-08-17 DIAGNOSIS — Z780 Asymptomatic menopausal state: Secondary | ICD-10-CM

## 2023-08-17 MED ORDER — ESTRADIOL 1 MG PO TABS: 1.0000 mg | ORAL_TABLET | Freq: Every day | ORAL | 1 refills | Status: AC

## 2023-08-17 NOTE — Telephone Encounter (Signed)
 Placed refill to [facility]

## 2023-08-25 DIAGNOSIS — H2513 Age-related nuclear cataract, bilateral: Secondary | ICD-10-CM | POA: Diagnosis not present

## 2023-12-10 ENCOUNTER — Ambulatory Visit: Admitting: Podiatry

## 2024-01-04 ENCOUNTER — Encounter: Payer: Self-pay | Admitting: Podiatry

## 2024-01-04 ENCOUNTER — Ambulatory Visit (INDEPENDENT_AMBULATORY_CARE_PROVIDER_SITE_OTHER)

## 2024-01-04 ENCOUNTER — Ambulatory Visit: Admitting: Podiatry

## 2024-01-04 VITALS — Ht 59.0 in | Wt 209.1 lb

## 2024-01-04 DIAGNOSIS — M7731 Calcaneal spur, right foot: Secondary | ICD-10-CM

## 2024-01-04 DIAGNOSIS — M7661 Achilles tendinitis, right leg: Secondary | ICD-10-CM

## 2024-01-04 DIAGNOSIS — M7751 Other enthesopathy of right foot: Secondary | ICD-10-CM

## 2024-01-04 MED ORDER — METHYLPREDNISOLONE 4 MG PO TBPK
ORAL_TABLET | ORAL | 0 refills | Status: DC
Start: 2024-01-04 — End: 2024-02-01

## 2024-01-04 MED ORDER — MELOXICAM 15 MG PO TABS: 15.0000 mg | ORAL_TABLET | Freq: Every day | ORAL | 1 refills | Status: DC

## 2024-01-04 NOTE — Progress Notes (Signed)
 Chief Complaint  Patient presents with   Foot Pain    Pt is here due to right foot and ankle pain, she states that she fell last year sometime and has been having pain off and on ever since, states when she fell she felt a burning sensation to the foot/ankle, has some swelling at times, always states that the heel and ankle occasionally is warm to touch, has been treating it with OTC meds for pain.    HPI: 68 y.o. female presenting today for evaluation of chronic right posterior heel pain.  The patient states that she has been experiencing right heel pain for about 2 years now.  She has a history of posterior heel spurs to the left lower extremity and ultimately had surgery to correct for this.  She says that her left foot is doing very well.  She has tried conservative modalities including anti-inflammatories and stretching exercises and shoe gear modifications with no relief of her symptoms to the right posterior heel.  She is very frustrated and presents for further treatment and evaluation   Past Medical History:  Diagnosis Date   Anxiety    Depression    Hyperlipidemia    Vitamin D  deficiency     Past Surgical History:  Procedure Laterality Date   ABDOMINAL HYSTERECTOMY     ACHILLES TENDON REPAIR Left 2024   APPENDECTOMY     BLADDER SURGERY     CARPAL TUNNEL RELEASE     Both   CHOLECYSTECTOMY     KNEE SURGERY Left x's two   RECONSTRUCTION OF NOSE Right 2010   TONSILLECTOMY      Allergies  Allergen Reactions   Gabapentin  Other (See Comments)    Pt stated she became aggressive and was wanting to fight.    Erythromycin      Physical Exam: General: The patient is alert and oriented x3 in no acute distress.  Dermatology: Skin is warm, dry and supple bilateral lower extremities.   Vascular: Palpable pedal pulses bilaterally. Capillary refill within normal limits.  No appreciable edema.  No erythema.  Neurological: Grossly intact via light touch  Musculoskeletal Exam:  Significant tenderness to palpation of the posterior aspect of the right heel.  Visible posterior 'bump' noted at the insertion of the Achilles on the calcaneus.  Muscle strength 5/5.  Calf is soft and supple.  Range of motion WNL  Radiographic Exam RT foot and ankle 01/04/2024:  Normal osseous mineralization. Joint spaces preserved.  Posterior heel spur with fragmentation noted at the insertion of the Achilles onto the posterior tubercle of the calcaneus on lateral view  Assessment/Plan of Care: 1. H/o retrocalcaneal exostectomy with repair of Achilles left. DOS: 01/29/2022  2.  Posterior heel spur right 3.  Achilles tendinitis right  -Patient evaluated.  X-rays reviewed -Unfortunately the patient has failed conservative management of her posterior heel pain which has been going on for about 2 years now.  She is very frustrated and would like to pursue surgery to correct for her posterior heel spur and pain.  She has had very good success with surgery to the left lower extremity.  I do believe it is appropriate this time to discuss surgery -Today we discussed posterior heel spur resection and repair of Achilles tendon in length in detail.  This would be the exact same procedure that she had performed to the contralateral limb.  Risk benefits advantages and disadvantages of the procedure were explained in length in detail as well as the  postoperative recovery course.  The patient agreed and would like to have surgery.  All of her questions were answered today no guarantees were expressed or implied -Authorization for surgery was initiated today.  Surgery will consist of posterior heel spur resection right.  Repair of Achilles tendon right -Just to help ease some of the patient's pain I did call in a prescription for Medrol  Dosepak as well as meloxicam  15 mg daily after completion of the Dosepak -Return to clinic 1 week postop  *Goes by Merlynn. 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 Thresa EMERSON Sar, DPM    [address] [place], [place] [address]                Office [phone]  Fax [phone]

## 2024-01-12 ENCOUNTER — Telehealth: Payer: Self-pay | Admitting: Podiatry

## 2024-01-12 NOTE — Telephone Encounter (Signed)
 Pt left message yesterday afternoon and is needing to know when her surgery is scheduled as he job is needing the information.   I returned call and pt is scheduled for 8/21 and is not on any blood thinners or glp1 medications and pharmacy is correct in chart

## 2024-01-21 ENCOUNTER — Telehealth: Payer: Self-pay | Admitting: Podiatry

## 2024-01-21 NOTE — Telephone Encounter (Signed)
 Patient states that the hospital 2,200 and she doesn't have the money. She may need to postpone the surgery. She has already got a quote from Hospital  financial aid but cannot afford that amount either.Do you have any suggestions ?

## 2024-01-24 ENCOUNTER — Telehealth: Payer: Self-pay | Admitting: Podiatry

## 2024-01-24 NOTE — Telephone Encounter (Signed)
 Left message for pt to call to discuss surgery as I seen a message stating she may need to postpone the surgery scheduled for 8/21.  Pt called back and is not able to pay what the surgery center is needing up front. She is 72, lives by herself and is taking care of her great grand baby.  What other options. I told her possibly the hospital but it would be at [facility] and on a Friday afternoon if you were ok with it. She said her boss is needing to know so he can go ahead and make the schedule for her work for next week. Please advise asap.

## 2024-01-24 NOTE — Telephone Encounter (Signed)
 DOS- 02/03/2024  CALCANEAL OSTEOTOMY (POSTERIOR) RT- 71881 ACHILLES TENDON REPAIR RT- 27650  [employer] EFFECTIVE DATE- 10/14/2019  DEDUCTIBLE- $6500 REMAINING- $6349.52 OOP- $8500 REMAINING- $8285.21 COINSURANCE- 20%/$0 COPAY  PER [employer] WEBSITE, NO PRIOR AUTHORIZATION IS REQUIRED FOR CPT CODES 71881 AND [id number].  DOCUMENTATION ATTACHED TO SURGERY CONSENT PACKET.

## 2024-01-26 ENCOUNTER — Telehealth: Payer: Self-pay | Admitting: Podiatry

## 2024-01-26 NOTE — Telephone Encounter (Signed)
 Pt left a message after lunch and again at almost 5. She is scheduled for surgery next week.  She has made a decision about surgery.  I returned call and left message for pt to call me back to discuss.

## 2024-01-26 NOTE — Telephone Encounter (Signed)
 Pt called back and I discussed case with Dr Janit and he said it would be ok to move to [facility] and we are going to get it scheduled for 8/29 in the afternoon.  She said thank you for helping her.

## 2024-01-28 ENCOUNTER — Telehealth: Payer: Self-pay | Admitting: Podiatry

## 2024-01-28 NOTE — Telephone Encounter (Signed)
 DOS- 02/11/2024   CALCANEAL OSTEOTOMY (POSTERIOR) RT- 71881 ACHILLES TENDON REPAIR RT- [id number]   [employer] EFFECTIVE DATE- 10/14/2019   DEDUCTIBLE- $6500 REMAINING- $6349.52 OOP- $8500 REMAINING- $8285.21 COINSURANCE- 20%/$0 COPAY   PER [employer] WEBSITE, NO PRIOR AUTHORIZATION IS REQUIRED FOR CPT CODES 71881 AND [phone].  DOCUMENTATION ATTACHED TO SURGERY CONSENT PACKET.

## 2024-02-02 ENCOUNTER — Ambulatory Visit: Admitting: Family Medicine

## 2024-02-02 ENCOUNTER — Encounter: Payer: Self-pay | Admitting: Family Medicine

## 2024-02-02 ENCOUNTER — Other Ambulatory Visit: Payer: Self-pay | Admitting: Podiatry

## 2024-02-02 VITALS — BP 122/74 | HR 73 | Resp 16 | Ht 59.0 in | Wt 213.8 lb

## 2024-02-02 DIAGNOSIS — I10 Essential (primary) hypertension: Secondary | ICD-10-CM | POA: Diagnosis not present

## 2024-02-02 DIAGNOSIS — H938X9 Other specified disorders of ear, unspecified ear: Secondary | ICD-10-CM

## 2024-02-02 DIAGNOSIS — Z01818 Encounter for other preprocedural examination: Secondary | ICD-10-CM

## 2024-02-02 MED ORDER — AMLODIPINE BESYLATE 5 MG PO TABS: 5.0000 mg | ORAL_TABLET | Freq: Every day | ORAL | 3 refills | Status: AC

## 2024-02-02 NOTE — H&P (View-Only) (Signed)
 Complete physical exam  Patient: Tammy Bailey   DOB: August 26, 1955   68 y.o. Female  MRN: [id number]  I, Curtis DELENA Boom, FNP, have reviewed all documentation for this visit. The documentation on 02/02/24 for the exam, diagnosis, procedures, and orders are all accurate and complete.   Subjective:    Chief Complaint  Patient presents with   Medical Clearance    Has an appt next Friday for surgery on achillis     Tammy Bailey is a 68 y.o. female who presents today for a complete physical exam. She reports consuming a general diet. Exercise is limited by orthopedic condition(s): foot pain. She generally feels well. She reports sleeping well. She does not have additional problems to discuss today.   Discussed the use of AI scribe software for clinical note transcription with the patient, who gave verbal consent to proceed.  History of Present Illness Tammy Bailey is a 68 year old female who presents for pre-operative evaluation for right Achilles tendon surgery.  She is scheduled for right Achilles tendon surgery, having previously undergone surgery on the left Achilles tendon a year and a half ago to address spurs. During recovery from the left Achilles surgery, she fell while descending stairs, resulting in a burning sensation in the right leg.  She describes a prolonged recovery period from the previous surgery, with the cast being removed in September and not being able to walk until late November or early December. She anticipates a similar recovery timeline for the upcoming surgery, involving a cast followed by a boot, with no weight-bearing initially.  She feels generally tired, attributing it to raising her two-year-old child and not having time for self-care.  She is currently taking amlodipine  and requires a refill. She also takes Synthroid  for thyroid  management, noting that her thyroid  levels have fluctuated in the past. She has allergies to erythromycin and  gabapentin .  No recent infections, chest pain, difficulty breathing, or shortness of breath. No urinary symptoms or recent changes in her health status.  Most recent fall risk assessment:    05/11/2023    3:24 PM  Fall Risk   Falls in the past year? 0  Number falls in past yr: 0  Injury with Fall? 0     Most recent depression screenings:    02/02/2024    3:49 PM 08/10/2023    9:55 AM  PHQ 2/9 Scores  PHQ - 2 Score 4 5  PHQ- 9 Score 12 15    Vision:Within last year and Dental: No current dental problems and Receives regular dental care  Patient Active Problem List   Diagnosis Date Noted   Dizziness 08/10/2023   Disorder of vitamin B12 05/11/2023   Avitaminosis D 05/11/2023   Prediabetes 05/11/2023   Mixed hyperlipidemia 05/11/2023   Eustachian tube dysfunction, bilateral 05/11/2023   Benign paroxysmal positional vertigo due to bilateral vestibular disorder 05/11/2023   Seasonal allergies 05/11/2023   Morbid obesity (HCC) 11/25/2022   Myalgia due to statin 10/16/2021   Annual physical exam 07/31/2021   Primary hypertension 02/21/2021   Depression, major, single episode, moderate (HCC) 04/02/2015   Post-menopausal 02/19/2015   Adult hypothyroidism 04/21/2002   Past Medical History:  Diagnosis Date   Anxiety    Depression    Hyperlipidemia    Vitamin D  deficiency    Past Surgical History:  Procedure Laterality Date   ABDOMINAL HYSTERECTOMY     ACHILLES TENDON REPAIR Left 2024   APPENDECTOMY  BLADDER SURGERY     CARPAL TUNNEL RELEASE     Both   CHOLECYSTECTOMY     KNEE SURGERY Left x's two   RECONSTRUCTION OF NOSE Right 2010   TONSILLECTOMY     Social History   Tobacco Use   Smoking status: Never   Smokeless tobacco: Never  Vaping Use   Vaping status: Never Used  Substance Use Topics   Alcohol use: No   Drug use: No   Allergies  Allergen Reactions   Gabapentin  Other (See Comments)    Pt stated she became aggressive and was wanting to fight.     Erythromycin Nausea And Vomiting    Passed out       Patient Care Team: Wellington Curtis LABOR, FNP as PCP - General (Family Medicine)   Outpatient Medications Prior to Visit  Medication Sig   esomeprazole (NEXIUM) 20 MG packet Take 20 mg by mouth daily as needed (acid reflux).   estradiol  (ESTRACE ) 1 MG tablet Take 1 tablet (1 mg total) by mouth daily.   ezetimibe  (ZETIA ) 10 MG tablet Take 1 tablet (10 mg total) by mouth daily.   levothyroxine  (SYNTHROID ) 150 MCG tablet Take 1 tablet (150 mcg total) by mouth daily before breakfast.   Multiple Vitamins-Minerals (MULTIVITAMIN WITH MINERALS) tablet Take 1 tablet by mouth daily.   valsartan -hydrochlorothiazide  (DIOVAN -HCT) 160-25 MG tablet Take 1 tablet by mouth daily.   [DISCONTINUED] amLODipine  (NORVASC ) 5 MG tablet Take 1 tablet (5 mg total) by mouth daily.   meclizine  (ANTIVERT ) 25 MG tablet Take 1 tablet (25 mg total) by mouth 3 (three) times daily as needed for dizziness. (Patient not taking: Reported on 02/02/2024)   [DISCONTINUED] meloxicam  (MOBIC ) 15 MG tablet Take 1 tablet (15 mg total) by mouth daily.   No facility-administered medications prior to visit.    ROS        Objective:     BP 122/74 (BP Location: Left Wrist, Patient Position: Sitting, Cuff Size: Normal)   Pulse 73   Resp 16   Ht 4' 11 (1.499 m)   Wt 213 lb 12.8 oz (97 kg)   SpO2 99%   BMI 43.18 kg/m    Physical Exam Constitutional:      General: She is not in acute distress.    Appearance: Normal appearance. She is obese. She is not ill-appearing, toxic-appearing or diaphoretic.  HENT:     Head: Normocephalic.     Right Ear: Tympanic membrane, ear canal and external ear normal. There is no impacted cerumen.     Left Ear: Tympanic membrane, ear canal and external ear normal. There is no impacted cerumen.     Nose: Nose normal. No congestion or rhinorrhea.     Mouth/Throat:     Mouth: Mucous membranes are moist.     Pharynx: Oropharynx is clear. No  oropharyngeal exudate or posterior oropharyngeal erythema.  Eyes:     General: Lids are normal.        Right eye: No discharge.        Left eye: No discharge.     Extraocular Movements: Extraocular movements intact.     Right eye: Normal extraocular motion.     Left eye: Normal extraocular motion.     Conjunctiva/sclera: Conjunctivae normal.     Right eye: Right conjunctiva is not injected.     Left eye: Left conjunctiva is not injected.     Pupils: Pupils are equal, round, and reactive to light.  Neck:  Thyroid : No thyroid  mass, thyromegaly or thyroid  tenderness.     Vascular: No carotid bruit.  Cardiovascular:     Rate and Rhythm: Normal rate and regular rhythm.     Pulses: Normal pulses.          Radial pulses are 2+ on the right side and 2+ on the left side.       Dorsalis pedis pulses are 2+ on the right side and 2+ on the left side.       Posterior tibial pulses are 2+ on the right side and 2+ on the left side.     Heart sounds: Normal heart sounds, S1 normal and S2 normal. No murmur heard.    No friction rub. No gallop.  Pulmonary:     Effort: Pulmonary effort is normal. No respiratory distress.     Breath sounds: Normal breath sounds. No stridor. No wheezing, rhonchi or rales.  Abdominal:     General: Bowel sounds are normal. There is no distension.     Palpations: Abdomen is soft. There is no mass.     Tenderness: There is no abdominal tenderness. There is no right CVA tenderness, left CVA tenderness, guarding or rebound.     Hernia: No hernia is present.  Musculoskeletal:        General: Tenderness present. No swelling. Normal range of motion.     Cervical back: Normal range of motion. No rigidity or tenderness.     Right foot: Tenderness present.     Comments: R achilles tendon pain  Lymphadenopathy:     Cervical: No cervical adenopathy.     Right cervical: No superficial, deep or posterior cervical adenopathy.    Left cervical: No superficial, deep or posterior  cervical adenopathy.  Skin:    General: Skin is warm and dry.     Capillary Refill: Capillary refill takes less than 2 seconds.     Findings: No bruising or erythema.  Neurological:     General: No focal deficit present.     Mental Status: She is alert and oriented to person, place, and time. Mental status is at baseline.     GCS: GCS eye subscore is 4. GCS verbal subscore is 5. GCS motor subscore is 6.     Cranial Nerves: Cranial nerves 2-12 are intact. No cranial nerve deficit, dysarthria or facial asymmetry.     Sensory: No sensory deficit.     Motor: No weakness, tremor or pronator drift.     Coordination: Romberg sign negative. Coordination normal.     Gait: Gait is intact. Gait normal.  Psychiatric:        Attention and Perception: Attention and perception normal.        Mood and Affect: Mood and affect normal.        Speech: Speech normal.        Behavior: Behavior normal. Behavior is cooperative.        Thought Content: Thought content normal.        Cognition and Memory: Cognition and memory normal.        Judgment: Judgment normal.      No results found for any visits on 02/02/24.     Assessment & Plan:    Routine Health Maintenance and Physical Exam  Assessment and Plan Assessment & Plan Right Achilles tendon disorder with planned surgery Scheduled for right Achilles tendon surgery, likely related to previous issues with spurs. She underwent left Achilles tendon surgery a year and a half ago  with prolonged recovery and is aware of the recovery process. - Proceed with planned right Achilles tendon surgery - Ensure post-operative support from family - Pt is cleared for surgery, no alarming physical findings, cardiac sounds normal , breath sounds clear. Vitals stable.  - Will fill out form for pt for foot surgery on 02/11/24  Hypertension Controlled, Chronic GOAL<130/80 - continue amlodipine  5mg  - continue valsartan -hydrochlorothiazide   160/25mg   Hypothyroidism Previous TSH mild elevation - recheck TSH - continue levothyroxine  150mcg daily, will adjust accordingly  Ear fullness Experiences dizziness,- intermittent use of meclizine  Recommend Intranasal steroids like Flonase may help alleviate symptoms,   Health Maintenance  Topic Date Due   Zoster (Shingles) Vaccine (1 of 2) 08/28/1973   Mammogram  04/11/2015   DTaP/Tdap/Td vaccine (2 - Td or Tdap) 08/28/2019   DEXA scan (bone density measurement)  Never done   Pneumococcal Vaccine for age over 55 (2 of 2 - PCV) 07/22/2021   COVID-19 Vaccine (4 - 2024-25 season) 02/14/2023   Flu Shot  01/14/2024   Cologuard (Stool DNA test)  07/12/2026   Hepatitis C Screening  Completed   HPV Vaccine  Aged Out   Meningitis B Vaccine  Aged Out    Discussed health benefits of physical activity, and encouraged her to engage in regular exercise appropriate for her age and condition.  Preoperative clearance  Primary hypertension -     amLODIPine  Besylate; Take 1 tablet (5 mg total) by mouth daily.  Dispense: 90 tablet; Refill: 3  Sensation of fullness in ear, unspecified laterality     Return in about 6 months (around 08/04/2024) for HTN.   I, Curtis DELENA Boom, FNP, have reviewed all documentation for this visit. The documentation on 02/02/24 for the exam, diagnosis, procedures, and orders are all accurate and complete.   Curtis DELENA Boom, FNP

## 2024-02-02 NOTE — Progress Notes (Signed)
 Complete physical exam  Patient: Tammy Bailey   DOB: August 26, 1955   68 y.o. Female  MRN: [id number]  I, Curtis DELENA Boom, FNP, have reviewed all documentation for this visit. The documentation on 02/02/24 for the exam, diagnosis, procedures, and orders are all accurate and complete.   Subjective:    Chief Complaint  Patient presents with   Medical Clearance    Has an appt next Friday for surgery on achillis     Tammy Bailey is a 68 y.o. female who presents today for a complete physical exam. She reports consuming a general diet. Exercise is limited by orthopedic condition(s): foot pain. She generally feels well. She reports sleeping well. She does not have additional problems to discuss today.   Discussed the use of AI scribe software for clinical note transcription with the patient, who gave verbal consent to proceed.  History of Present Illness Tammy Bailey is a 68 year old female who presents for pre-operative evaluation for right Achilles tendon surgery.  She is scheduled for right Achilles tendon surgery, having previously undergone surgery on the left Achilles tendon a year and a half ago to address spurs. During recovery from the left Achilles surgery, she fell while descending stairs, resulting in a burning sensation in the right leg.  She describes a prolonged recovery period from the previous surgery, with the cast being removed in September and not being able to walk until late November or early December. She anticipates a similar recovery timeline for the upcoming surgery, involving a cast followed by a boot, with no weight-bearing initially.  She feels generally tired, attributing it to raising her two-year-old child and not having time for self-care.  She is currently taking amlodipine  and requires a refill. She also takes Synthroid  for thyroid  management, noting that her thyroid  levels have fluctuated in the past. She has allergies to erythromycin and  gabapentin .  No recent infections, chest pain, difficulty breathing, or shortness of breath. No urinary symptoms or recent changes in her health status.  Most recent fall risk assessment:    05/11/2023    3:24 PM  Fall Risk   Falls in the past year? 0  Number falls in past yr: 0  Injury with Fall? 0     Most recent depression screenings:    02/02/2024    3:49 PM 08/10/2023    9:55 AM  PHQ 2/9 Scores  PHQ - 2 Score 4 5  PHQ- 9 Score 12 15    Vision:Within last year and Dental: No current dental problems and Receives regular dental care  Patient Active Problem List   Diagnosis Date Noted   Dizziness 08/10/2023   Disorder of vitamin B12 05/11/2023   Avitaminosis D 05/11/2023   Prediabetes 05/11/2023   Mixed hyperlipidemia 05/11/2023   Eustachian tube dysfunction, bilateral 05/11/2023   Benign paroxysmal positional vertigo due to bilateral vestibular disorder 05/11/2023   Seasonal allergies 05/11/2023   Morbid obesity (HCC) 11/25/2022   Myalgia due to statin 10/16/2021   Annual physical exam 07/31/2021   Primary hypertension 02/21/2021   Depression, major, single episode, moderate (HCC) 04/02/2015   Post-menopausal 02/19/2015   Adult hypothyroidism 04/21/2002   Past Medical History:  Diagnosis Date   Anxiety    Depression    Hyperlipidemia    Vitamin D  deficiency    Past Surgical History:  Procedure Laterality Date   ABDOMINAL HYSTERECTOMY     ACHILLES TENDON REPAIR Left 2024   APPENDECTOMY  BLADDER SURGERY     CARPAL TUNNEL RELEASE     Both   CHOLECYSTECTOMY     KNEE SURGERY Left x's two   RECONSTRUCTION OF NOSE Right 2010   TONSILLECTOMY     Social History   Tobacco Use   Smoking status: Never   Smokeless tobacco: Never  Vaping Use   Vaping status: Never Used  Substance Use Topics   Alcohol use: No   Drug use: No   Allergies  Allergen Reactions   Gabapentin  Other (See Comments)    Pt stated she became aggressive and was wanting to fight.     Erythromycin Nausea And Vomiting    Passed out       Patient Care Team: Wellington Curtis LABOR, FNP as PCP - General (Family Medicine)   Outpatient Medications Prior to Visit  Medication Sig   esomeprazole (NEXIUM) 20 MG packet Take 20 mg by mouth daily as needed (acid reflux).   estradiol  (ESTRACE ) 1 MG tablet Take 1 tablet (1 mg total) by mouth daily.   ezetimibe  (ZETIA ) 10 MG tablet Take 1 tablet (10 mg total) by mouth daily.   levothyroxine  (SYNTHROID ) 150 MCG tablet Take 1 tablet (150 mcg total) by mouth daily before breakfast.   Multiple Vitamins-Minerals (MULTIVITAMIN WITH MINERALS) tablet Take 1 tablet by mouth daily.   valsartan -hydrochlorothiazide  (DIOVAN -HCT) 160-25 MG tablet Take 1 tablet by mouth daily.   [DISCONTINUED] amLODipine  (NORVASC ) 5 MG tablet Take 1 tablet (5 mg total) by mouth daily.   meclizine  (ANTIVERT ) 25 MG tablet Take 1 tablet (25 mg total) by mouth 3 (three) times daily as needed for dizziness. (Patient not taking: Reported on 02/02/2024)   [DISCONTINUED] meloxicam  (MOBIC ) 15 MG tablet Take 1 tablet (15 mg total) by mouth daily.   No facility-administered medications prior to visit.    ROS        Objective:     BP 122/74 (BP Location: Left Wrist, Patient Position: Sitting, Cuff Size: Normal)   Pulse 73   Resp 16   Ht 4' 11 (1.499 m)   Wt 213 lb 12.8 oz (97 kg)   SpO2 99%   BMI 43.18 kg/m    Physical Exam Constitutional:      General: She is not in acute distress.    Appearance: Normal appearance. She is obese. She is not ill-appearing, toxic-appearing or diaphoretic.  HENT:     Head: Normocephalic.     Right Ear: Tympanic membrane, ear canal and external ear normal. There is no impacted cerumen.     Left Ear: Tympanic membrane, ear canal and external ear normal. There is no impacted cerumen.     Nose: Nose normal. No congestion or rhinorrhea.     Mouth/Throat:     Mouth: Mucous membranes are moist.     Pharynx: Oropharynx is clear. No  oropharyngeal exudate or posterior oropharyngeal erythema.  Eyes:     General: Lids are normal.        Right eye: No discharge.        Left eye: No discharge.     Extraocular Movements: Extraocular movements intact.     Right eye: Normal extraocular motion.     Left eye: Normal extraocular motion.     Conjunctiva/sclera: Conjunctivae normal.     Right eye: Right conjunctiva is not injected.     Left eye: Left conjunctiva is not injected.     Pupils: Pupils are equal, round, and reactive to light.  Neck:  Thyroid : No thyroid  mass, thyromegaly or thyroid  tenderness.     Vascular: No carotid bruit.  Cardiovascular:     Rate and Rhythm: Normal rate and regular rhythm.     Pulses: Normal pulses.          Radial pulses are 2+ on the right side and 2+ on the left side.       Dorsalis pedis pulses are 2+ on the right side and 2+ on the left side.       Posterior tibial pulses are 2+ on the right side and 2+ on the left side.     Heart sounds: Normal heart sounds, S1 normal and S2 normal. No murmur heard.    No friction rub. No gallop.  Pulmonary:     Effort: Pulmonary effort is normal. No respiratory distress.     Breath sounds: Normal breath sounds. No stridor. No wheezing, rhonchi or rales.  Abdominal:     General: Bowel sounds are normal. There is no distension.     Palpations: Abdomen is soft. There is no mass.     Tenderness: There is no abdominal tenderness. There is no right CVA tenderness, left CVA tenderness, guarding or rebound.     Hernia: No hernia is present.  Musculoskeletal:        General: Tenderness present. No swelling. Normal range of motion.     Cervical back: Normal range of motion. No rigidity or tenderness.     Right foot: Tenderness present.     Comments: R achilles tendon pain  Lymphadenopathy:     Cervical: No cervical adenopathy.     Right cervical: No superficial, deep or posterior cervical adenopathy.    Left cervical: No superficial, deep or posterior  cervical adenopathy.  Skin:    General: Skin is warm and dry.     Capillary Refill: Capillary refill takes less than 2 seconds.     Findings: No bruising or erythema.  Neurological:     General: No focal deficit present.     Mental Status: She is alert and oriented to person, place, and time. Mental status is at baseline.     GCS: GCS eye subscore is 4. GCS verbal subscore is 5. GCS motor subscore is 6.     Cranial Nerves: Cranial nerves 2-12 are intact. No cranial nerve deficit, dysarthria or facial asymmetry.     Sensory: No sensory deficit.     Motor: No weakness, tremor or pronator drift.     Coordination: Romberg sign negative. Coordination normal.     Gait: Gait is intact. Gait normal.  Psychiatric:        Attention and Perception: Attention and perception normal.        Mood and Affect: Mood and affect normal.        Speech: Speech normal.        Behavior: Behavior normal. Behavior is cooperative.        Thought Content: Thought content normal.        Cognition and Memory: Cognition and memory normal.        Judgment: Judgment normal.      No results found for any visits on 02/02/24.     Assessment & Plan:    Routine Health Maintenance and Physical Exam  Assessment and Plan Assessment & Plan Right Achilles tendon disorder with planned surgery Scheduled for right Achilles tendon surgery, likely related to previous issues with spurs. She underwent left Achilles tendon surgery a year and a half ago  with prolonged recovery and is aware of the recovery process. - Proceed with planned right Achilles tendon surgery - Ensure post-operative support from family - Pt is cleared for surgery, no alarming physical findings, cardiac sounds normal , breath sounds clear. Vitals stable.  - Will fill out form for pt for foot surgery on 02/11/24  Hypertension Controlled, Chronic GOAL<130/80 - continue amlodipine  5mg  - continue valsartan -hydrochlorothiazide   160/25mg   Hypothyroidism Previous TSH mild elevation - recheck TSH - continue levothyroxine  150mcg daily, will adjust accordingly  Ear fullness Experiences dizziness,- intermittent use of meclizine  Recommend Intranasal steroids like Flonase may help alleviate symptoms,   Health Maintenance  Topic Date Due   Zoster (Shingles) Vaccine (1 of 2) 08/28/1973   Mammogram  04/11/2015   DTaP/Tdap/Td vaccine (2 - Td or Tdap) 08/28/2019   DEXA scan (bone density measurement)  Never done   Pneumococcal Vaccine for age over 55 (2 of 2 - PCV) 07/22/2021   COVID-19 Vaccine (4 - 2024-25 season) 02/14/2023   Flu Shot  01/14/2024   Cologuard (Stool DNA test)  07/12/2026   Hepatitis C Screening  Completed   HPV Vaccine  Aged Out   Meningitis B Vaccine  Aged Out    Discussed health benefits of physical activity, and encouraged her to engage in regular exercise appropriate for her age and condition.  Preoperative clearance  Primary hypertension -     amLODIPine  Besylate; Take 1 tablet (5 mg total) by mouth daily.  Dispense: 90 tablet; Refill: 3  Sensation of fullness in ear, unspecified laterality     Return in about 6 months (around 08/04/2024) for HTN.   I, Curtis DELENA Boom, FNP, have reviewed all documentation for this visit. The documentation on 02/02/24 for the exam, diagnosis, procedures, and orders are all accurate and complete.   Curtis DELENA Boom, FNP

## 2024-02-03 ENCOUNTER — Ambulatory Visit: Payer: Self-pay | Admitting: Family Medicine

## 2024-02-03 LAB — TSH: TSH: 0.778 u[IU]/mL (ref 0.450–4.500)

## 2024-02-04 ENCOUNTER — Encounter
Admission: RE | Admit: 2024-02-04 | Discharge: 2024-02-04 | Disposition: A | Discharge status: Home / Self Care | Source: Ambulatory Visit | Attending: Podiatry | Admitting: Podiatry

## 2024-02-04 ENCOUNTER — Other Ambulatory Visit: Payer: Self-pay

## 2024-02-04 VITALS — Ht 59.0 in | Wt 213.0 lb

## 2024-02-04 DIAGNOSIS — Z01810 Encounter for preprocedural cardiovascular examination: Secondary | ICD-10-CM

## 2024-02-04 DIAGNOSIS — R7303 Prediabetes: Secondary | ICD-10-CM

## 2024-02-04 DIAGNOSIS — I10 Essential (primary) hypertension: Secondary | ICD-10-CM

## 2024-02-04 HISTORY — DX: Calcaneal spur, right foot: M77.31

## 2024-02-04 HISTORY — DX: Gastro-esophageal reflux disease without esophagitis: K21.9

## 2024-02-04 HISTORY — DX: Prediabetes: R73.03

## 2024-02-04 HISTORY — DX: Unilateral primary osteoarthritis, left knee: M17.12

## 2024-02-04 HISTORY — DX: Nausea with vomiting, unspecified: Z98.890

## 2024-02-04 HISTORY — DX: Major depressive disorder, single episode, moderate: F32.1

## 2024-02-04 HISTORY — DX: Dizziness and giddiness: R42

## 2024-02-04 HISTORY — DX: Essential (primary) hypertension: I10

## 2024-02-04 HISTORY — DX: Morbid (severe) obesity due to excess calories: E66.01

## 2024-02-04 HISTORY — DX: Hypothyroidism, unspecified: E03.9

## 2024-02-04 HISTORY — DX: Unspecified osteoarthritis, unspecified site: M19.90

## 2024-02-04 NOTE — Patient Instructions (Addendum)
 Your procedure is scheduled on: Friday 02/11/24  Report to the Registration Desk on the 1st floor of the [facility]. To find out your arrival time, please call [phone] between 1PM - 3PM on: Thursday 02/10/24  If your arrival time is 6:00 am, do not arrive before that time as the [facility] entrance doors do not open until 6:00 am.  REMEMBER: Instructions that are not followed completely may result in serious medical risk, up to and including death; or upon the discretion of your surgeon and anesthesiologist your surgery may need to be rescheduled.  Do not eat food or drink fluids after midnight the night before surgery.  No gum chewing or hard candies.   One week prior to surgery: Stop Anti-inflammatories (NSAIDS) such as Advil, Aleve, Ibuprofen, Motrin, Naproxen, Naprosyn and Aspirin based products such as Excedrin, Goody's Powder, BC Powder. Stop ANY OVER THE COUNTER supplements until after surgery.  You may however, continue to take Tylenol  if needed for pain up until the day of surgery.   Continue taking all of your other prescription medications up until the day of surgery.  ON THE DAY OF SURGERY ONLY TAKE THESE MEDICATIONS WITH SIPS OF WATER:  amLODipine  (NORVASC ) 5 MG  Levothyroxine  (SYNTHROID ) 150 MCG  estradiol  (ESTRACE ) 1 MG  ezetimibe  (ZETIA ) 10 MG    No Alcohol for 24 hours before or after surgery.  No Smoking including e-cigarettes for 24 hours before surgery.  No chewable tobacco products for at least 6 hours before surgery.  No nicotine patches on the day of surgery.  Do not use any recreational drugs for at least a week (preferably 2 weeks) before your surgery.  Please be advised that the combination of cocaine and anesthesia may have negative outcomes, up to and including death. If you test positive for cocaine, your surgery will be cancelled.  On the morning of surgery brush your teeth with toothpaste and water, you may rinse your mouth with  mouthwash if you wish. Do not swallow any toothpaste or mouthwash.  Use CHG Soap or wipes as directed on instruction sheet.  Do not wear jewelry, make-up, hairpins, clips or nail polish.  For welded (permanent) jewelry: bracelets, anklets, waist bands, etc.  Please have this removed prior to surgery.  If it is not removed, there is a chance that hospital personnel will need to cut it off on the day of surgery.  Do not wear lotions, powders, or perfumes.   Do not shave body hair from the neck down 48 hours before surgery.  Contact lenses, hearing aids and dentures may not be worn into surgery.  Do not bring valuables to the hospital. [facility] is not responsible for any missing/lost belongings or valuables.   Notify your doctor if there is any change in your medical condition (cold, fever, infection).  Wear comfortable clothing (specific to your surgery type) to the hospital.  If you are being admitted to the hospital overnight, leave your suitcase in the car. After surgery it may be brought to your room.  In case of increased patient census, it may be necessary for you, the patient, to continue your postoperative care in the Same Day Surgery department.  If you are being discharged the day of surgery, you will not be allowed to drive home. You will need a responsible individual to drive you home and stay with you for 24 hours after surgery.   If you are taking public transportation, you will need to have a responsible  individual with you.  Please call the Pre-admissions Testing Dept. at [phone] if you have any questions about these instructions.  Surgery Visitation Policy:  Patients having surgery or a procedure may have two visitors.  Children under the age of 29 must have an adult with them who is not the patient.  Inpatient Visitation:    Visiting hours are 7 a.m. to 8 p.m. Up to four visitors are allowed at one time in a patient room. The visitors may rotate out  with other people during the day.  One visitor age 58 or older may stay with the patient overnight and must be in the room by 8 p.m.   Merchandiser, retail to address health-related social needs:  [email or web address]                                                                                                             Preparing for Surgery with CHLORHEXIDINE GLUCONATE (CHG) Soap  Chlorhexidine Gluconate (CHG) Soap  o An antiseptic cleaner that kills germs and bonds with the skin to continue killing germs even after washing  o Used for showering the night before surgery and morning of surgery  Before surgery, you can play an important role by reducing the number of germs on your skin.  CHG (Chlorhexidine gluconate) soap is an antiseptic cleanser which kills germs and bonds with the skin to continue killing germs even after washing.  Please do not use if you have an allergy to CHG or antibacterial soaps. If your skin becomes reddened/irritated stop using the CHG.  1. Shower the NIGHT BEFORE SURGERY and the MORNING OF SURGERY with CHG soap.  2. If you choose to wash your hair, wash your hair first as usual with your normal shampoo.  3. After shampooing, rinse your hair and body thoroughly to remove the shampoo.  4. Use CHG as you would any other liquid soap. You can apply CHG directly to the skin and wash gently with a scrungie or a clean washcloth.  5. Apply the CHG soap to your body only from the neck down. Do not use on open wounds or open sores. Avoid contact with your eyes, ears, mouth, and genitals (private parts). Wash face and genitals (private parts) with your normal soap.  6. Wash thoroughly, paying special attention to the area where your surgery will be performed.  7. Thoroughly rinse your body with warm water.  8. Do not shower/wash with your normal soap after using and rinsing off the CHG soap.  9. Pat yourself dry with a clean towel.  10.  Wear clean pajamas to bed the night before surgery.  12. Place clean sheets on your bed the night of your first shower and do not sleep with pets.  13. Shower again with the CHG soap on the day of surgery prior to arriving at the hospital.  14. Do not apply any deodorants/lotions/powders.  15. Please wear clean clothes to the hospital.

## 2024-02-07 ENCOUNTER — Encounter: Payer: Self-pay | Admitting: Podiatry

## 2024-02-07 ENCOUNTER — Encounter
Admission: RE | Admit: 2024-02-07 | Discharge: 2024-02-07 | Disposition: A | Discharge status: Home / Self Care | Source: Ambulatory Visit | Attending: Podiatry | Admitting: Podiatry

## 2024-02-07 DIAGNOSIS — Z01810 Encounter for preprocedural cardiovascular examination: Secondary | ICD-10-CM | POA: Diagnosis present

## 2024-02-07 DIAGNOSIS — I10 Essential (primary) hypertension: Secondary | ICD-10-CM | POA: Insufficient documentation

## 2024-02-07 DIAGNOSIS — R7303 Prediabetes: Secondary | ICD-10-CM | POA: Insufficient documentation

## 2024-02-07 DIAGNOSIS — R9431 Abnormal electrocardiogram [ECG] [EKG]: Secondary | ICD-10-CM | POA: Insufficient documentation

## 2024-02-07 DIAGNOSIS — Z01818 Encounter for other preprocedural examination: Secondary | ICD-10-CM | POA: Insufficient documentation

## 2024-02-07 DIAGNOSIS — Z01812 Encounter for preprocedural laboratory examination: Secondary | ICD-10-CM | POA: Diagnosis present

## 2024-02-07 LAB — CBC
HCT: 38.1 % (ref 36.0–46.0)
Hemoglobin: 12.8 g/dL (ref 12.0–15.0)
MCH: 30.3 pg (ref 26.0–34.0)
MCHC: 33.6 g/dL (ref 30.0–36.0)
MCV: 90.3 fL (ref 80.0–100.0)
Platelets: 357 K/uL (ref 150–400)
RBC: 4.22 MIL/uL (ref 3.87–5.11)
RDW: 12.9 % (ref 11.5–15.5)
WBC: 6.3 K/uL (ref 4.0–10.5)
nRBC: 0 % (ref 0.0–0.2)

## 2024-02-07 LAB — BASIC METABOLIC PANEL WITH GFR
Anion gap: 7 (ref 5–15)
BUN: 14 mg/dL (ref 8–23)
CO2: 24 mmol/L (ref 22–32)
Calcium: 8.8 mg/dL — ABNORMAL LOW (ref 8.9–10.3)
Chloride: 108 mmol/L (ref 98–111)
Creatinine, Ser: 0.74 mg/dL (ref 0.44–1.00)
GFR, Estimated: >60 mL/min (ref >60)
Glucose, Bld: 123 mg/dL — ABNORMAL HIGH (ref 70–99)
Potassium: 3.7 mmol/L (ref 3.5–5.1)
Sodium: 139 mmol/L (ref 135–145)

## 2024-02-11 ENCOUNTER — Other Ambulatory Visit: Payer: Self-pay

## 2024-02-11 ENCOUNTER — Encounter: Payer: Self-pay | Admitting: Podiatry

## 2024-02-11 ENCOUNTER — Encounter: Admitting: Podiatry

## 2024-02-11 ENCOUNTER — Encounter
Admission: RE | Disposition: A | Discharge status: Home / Self Care | Payer: Self-pay | Source: Home / Self Care | Attending: Podiatry

## 2024-02-11 ENCOUNTER — Ambulatory Visit: Payer: Self-pay | Admitting: Urgent Care

## 2024-02-11 ENCOUNTER — Encounter: Payer: Self-pay | Admitting: Urgent Care

## 2024-02-11 ENCOUNTER — Ambulatory Visit
Admission: RE | Admit: 2024-02-11 | Discharge: 2024-02-11 | Disposition: A | Discharge status: Home / Self Care | Attending: Podiatry | Admitting: Podiatry

## 2024-02-11 ENCOUNTER — Ambulatory Visit

## 2024-02-11 DIAGNOSIS — Z6841 Body Mass Index (BMI) 40.0 and over, adult: Secondary | ICD-10-CM | POA: Insufficient documentation

## 2024-02-11 DIAGNOSIS — R7303 Prediabetes: Secondary | ICD-10-CM | POA: Insufficient documentation

## 2024-02-11 DIAGNOSIS — F32A Depression, unspecified: Secondary | ICD-10-CM | POA: Insufficient documentation

## 2024-02-11 DIAGNOSIS — E782 Mixed hyperlipidemia: Secondary | ICD-10-CM | POA: Diagnosis not present

## 2024-02-11 DIAGNOSIS — M7731 Calcaneal spur, right foot: Secondary | ICD-10-CM | POA: Diagnosis not present

## 2024-02-11 DIAGNOSIS — Z79899 Other long term (current) drug therapy: Secondary | ICD-10-CM | POA: Diagnosis not present

## 2024-02-11 DIAGNOSIS — K219 Gastro-esophageal reflux disease without esophagitis: Secondary | ICD-10-CM | POA: Diagnosis not present

## 2024-02-11 DIAGNOSIS — Z791 Long term (current) use of non-steroidal anti-inflammatories (NSAID): Secondary | ICD-10-CM | POA: Insufficient documentation

## 2024-02-11 DIAGNOSIS — M7661 Achilles tendinitis, right leg: Secondary | ICD-10-CM | POA: Insufficient documentation

## 2024-02-11 DIAGNOSIS — Z780 Asymptomatic menopausal state: Secondary | ICD-10-CM | POA: Insufficient documentation

## 2024-02-11 DIAGNOSIS — F419 Anxiety disorder, unspecified: Secondary | ICD-10-CM | POA: Diagnosis not present

## 2024-02-11 DIAGNOSIS — F321 Major depressive disorder, single episode, moderate: Secondary | ICD-10-CM | POA: Diagnosis not present

## 2024-02-11 DIAGNOSIS — E039 Hypothyroidism, unspecified: Secondary | ICD-10-CM | POA: Insufficient documentation

## 2024-02-11 DIAGNOSIS — W19XXXA Unspecified fall, initial encounter: Secondary | ICD-10-CM | POA: Insufficient documentation

## 2024-02-11 DIAGNOSIS — Z79890 Hormone replacement therapy (postmenopausal): Secondary | ICD-10-CM | POA: Diagnosis not present

## 2024-02-11 DIAGNOSIS — E66813 Obesity, class 3: Secondary | ICD-10-CM | POA: Insufficient documentation

## 2024-02-11 DIAGNOSIS — I10 Essential (primary) hypertension: Secondary | ICD-10-CM | POA: Insufficient documentation

## 2024-02-11 HISTORY — PX: CALCANEAL OSTEOTOMY: SHX1281

## 2024-02-11 HISTORY — PX: ACHILLES TENDON SURGERY: SHX542

## 2024-02-11 SURGERY — OSTEOTOMY, CALCANEUS
Anesthesia: General | Site: Heel | Laterality: Right

## 2024-02-11 MED ORDER — LIDOCAINE HCL (PF) 1 % IJ SOLN
INTRAMUSCULAR | Status: AC
  Filled 2024-02-11: qty 30

## 2024-02-11 MED ORDER — ACETAMINOPHEN 10 MG/ML IV SOLN
INTRAVENOUS | Status: DC | PRN
  Administered 2024-02-11: 1000 mg via INTRAVENOUS

## 2024-02-11 MED ORDER — ACETAMINOPHEN 10 MG/ML IV SOLN
INTRAVENOUS | Status: AC
  Filled 2024-02-11: qty 100

## 2024-02-11 MED ORDER — OXYCODONE-ACETAMINOPHEN 5-325 MG PO TABS: 1.0000 | ORAL_TABLET | ORAL | 0 refills | Status: DC | PRN

## 2024-02-11 MED ORDER — LIDOCAINE-EPINEPHRINE 2 %-1:100000 IJ SOLN
INTRAMUSCULAR | Status: AC
Start: 2024-02-11 — End: 2024-02-11
  Filled 2024-02-11: qty 1

## 2024-02-11 MED ORDER — MIDAZOLAM HCL 2 MG/2ML IJ SOLN
INTRAMUSCULAR | Status: DC | PRN
  Administered 2024-02-11: 2 mg via INTRAVENOUS

## 2024-02-11 MED ORDER — LACTATED RINGERS IV SOLN: INTRAVENOUS | Status: DC

## 2024-02-11 MED ORDER — ROCURONIUM BROMIDE 10 MG/ML (PF) SYRINGE
PREFILLED_SYRINGE | INTRAVENOUS | Status: AC
  Filled 2024-02-11: qty 10

## 2024-02-11 MED ORDER — CEFAZOLIN SODIUM-DEXTROSE 2-4 GM/100ML-% IV SOLN
INTRAVENOUS | Status: AC
  Filled 2024-02-11: qty 100

## 2024-02-11 MED ORDER — LIDOCAINE HCL (PF) 2 % IJ SOLN
INTRAMUSCULAR | Status: AC
  Filled 2024-02-11: qty 5

## 2024-02-11 MED ORDER — CHLORHEXIDINE GLUCONATE 0.12 % MT SOLN
15.0000 mL | Freq: Once | OROMUCOSAL | Status: AC
  Administered 2024-02-11: 15 mL via OROMUCOSAL

## 2024-02-11 MED ORDER — CEFAZOLIN SODIUM-DEXTROSE 2-4 GM/100ML-% IV SOLN
2.0000 g | INTRAVENOUS | Status: AC
  Administered 2024-02-11: 2 g via INTRAVENOUS

## 2024-02-11 MED ORDER — CHLORHEXIDINE GLUCONATE 0.12 % MT SOLN
OROMUCOSAL | Status: AC
  Filled 2024-02-11: qty 15

## 2024-02-11 MED ORDER — BUPIVACAINE-EPINEPHRINE (PF) 0.5% -1:200000 IJ SOLN
INTRAMUSCULAR | Status: AC
  Filled 2024-02-11: qty 30

## 2024-02-11 MED ORDER — PROPOFOL 10 MG/ML IV BOLUS
INTRAVENOUS | Status: DC | PRN
  Administered 2024-02-11: 150 ug/kg/min via INTRAVENOUS
  Administered 2024-02-11: 150 mg via INTRAVENOUS

## 2024-02-11 MED ORDER — FENTANYL CITRATE (PF) 100 MCG/2ML IJ SOLN
INTRAMUSCULAR | Status: DC | PRN
  Administered 2024-02-11 (×2): 50 ug via INTRAVENOUS

## 2024-02-11 MED ORDER — FENTANYL CITRATE (PF) 100 MCG/2ML IJ SOLN
INTRAMUSCULAR | Status: AC
  Filled 2024-02-11: qty 2

## 2024-02-11 MED ORDER — ORAL CARE MOUTH RINSE: 15.0000 mL | Freq: Once | OROMUCOSAL | Status: AC

## 2024-02-11 MED ORDER — SUGAMMADEX SODIUM 200 MG/2ML IV SOLN
INTRAVENOUS | Status: DC | PRN
  Administered 2024-02-11: 150 mg via INTRAVENOUS

## 2024-02-11 MED ORDER — LIDOCAINE HCL (PF) 1 % IJ SOLN
INTRAMUSCULAR | Status: DC | PRN
  Administered 2024-02-11: 10 mL

## 2024-02-11 MED ORDER — MIDAZOLAM HCL 2 MG/2ML IJ SOLN
INTRAMUSCULAR | Status: AC
  Filled 2024-02-11: qty 2

## 2024-02-11 MED ORDER — PROPOFOL 1000 MG/100ML IV EMUL
INTRAVENOUS | Status: AC
Start: 2024-02-11 — End: 2024-02-11
  Filled 2024-02-11: qty 100

## 2024-02-11 MED ORDER — ONDANSETRON HCL 4 MG/2ML IJ SOLN
INTRAMUSCULAR | Status: DC | PRN
  Administered 2024-02-11: 4 mg via INTRAVENOUS

## 2024-02-11 MED ORDER — HYDROMORPHONE HCL 1 MG/ML IJ SOLN
INTRAMUSCULAR | Status: AC
  Filled 2024-02-11: qty 1

## 2024-02-11 MED ORDER — OXYCODONE HCL 5 MG PO TABS
5.0000 mg | ORAL_TABLET | Freq: Once | ORAL | Status: AC | PRN
  Administered 2024-02-11: 5 mg via ORAL

## 2024-02-11 MED ORDER — SODIUM CHLORIDE 0.9 % IR SOLN
Status: DC | PRN
  Administered 2024-02-11: 500 mL

## 2024-02-11 MED ORDER — ASPIRIN 325 MG PO TABS: 325.0000 mg | ORAL_TABLET | Freq: Every day | ORAL | 0 refills | Status: AC

## 2024-02-11 MED ORDER — OXYCODONE HCL 5 MG PO TABS
ORAL_TABLET | ORAL | Status: AC
  Filled 2024-02-11: qty 1

## 2024-02-11 MED ORDER — FENTANYL CITRATE (PF) 100 MCG/2ML IJ SOLN
25.0000 ug | INTRAMUSCULAR | Status: DC | PRN
  Administered 2024-02-11 (×2): 50 ug via INTRAVENOUS

## 2024-02-11 MED ORDER — PROPOFOL 1000 MG/100ML IV EMUL
INTRAVENOUS | Status: AC
  Filled 2024-02-11: qty 100

## 2024-02-11 MED ORDER — LIDOCAINE HCL (CARDIAC) PF 100 MG/5ML IV SOSY
PREFILLED_SYRINGE | INTRAVENOUS | Status: DC | PRN
  Administered 2024-02-11: 100 mg via INTRAVENOUS

## 2024-02-11 MED ORDER — OXYCODONE HCL 5 MG/5ML PO SOLN: 5.0000 mg | Freq: Once | ORAL | Status: AC | PRN

## 2024-02-11 MED ORDER — HYDROMORPHONE HCL 1 MG/ML IJ SOLN
0.5000 mg | INTRAMUSCULAR | Status: DC | PRN
  Administered 2024-02-11 (×2): 0.5 mg via INTRAVENOUS

## 2024-02-11 MED ORDER — IBUPROFEN 800 MG PO TABS: 800.0000 mg | ORAL_TABLET | Freq: Three times a day (TID) | ORAL | 1 refills | Status: DC

## 2024-02-11 MED ORDER — BUPIVACAINE-EPINEPHRINE 0.5% -1:200000 IJ SOLN
INTRAMUSCULAR | Status: DC | PRN
  Administered 2024-02-11: 10 mL

## 2024-02-11 MED ORDER — DEXAMETHASONE SODIUM PHOSPHATE 10 MG/ML IJ SOLN
INTRAMUSCULAR | Status: DC | PRN
  Administered 2024-02-11: 10 mg via INTRAVENOUS

## 2024-02-11 MED ORDER — ROCURONIUM BROMIDE 100 MG/10ML IV SOLN
INTRAVENOUS | Status: DC | PRN
  Administered 2024-02-11: 40 mg via INTRAVENOUS

## 2024-02-11 SURGICAL SUPPLY — 59 items
ANCH SUT GRAPPLER 2.8 STL (Anchor) IMPLANT
BLADE AVERAGE 25X9 (BLADE) ×2 IMPLANT
BLADE SURG 15 STRL LF DISP TIS (BLADE) ×4 IMPLANT
BNDG ELASTIC 4INX 5YD STR LF (GAUZE/BANDAGES/DRESSINGS) ×2 IMPLANT
BNDG ELASTIC 6INX 5YD STR LF (GAUZE/BANDAGES/DRESSINGS) ×2 IMPLANT
BNDG ESMARCH 4X12 STRL LF (GAUZE/BANDAGES/DRESSINGS) ×2 IMPLANT
BNDG GAUZE DERMACEA FLUFF 4 (GAUZE/BANDAGES/DRESSINGS) ×2 IMPLANT
COVER LIGHT HANDLE STERIS (MISCELLANEOUS) ×4 IMPLANT
CUFF TOURN DUAL QUICK 18 (TOURNIQUET CUFF) IMPLANT
CUFF TOURN SGL QUICK 18X4 (TOURNIQUET CUFF) IMPLANT
CUFF TRNQT CYL 34X4.125X (TOURNIQUET CUFF) IMPLANT
DRAPE C-ARMOR (DRAPES) IMPLANT
DRAPE EXTREMITY 106X87X128.5 (DRAPES) ×2 IMPLANT
DRAPE FLUOR MINI C-ARM 54X84 (DRAPES) ×2 IMPLANT
DRAPE TABLE BACK 80X90 (DRAPES) ×2 IMPLANT
DRAPE U-SHAPE 47X51 STRL (DRAPES) ×2 IMPLANT
DRSG TELFA 3X8 NADH STRL (GAUZE/BANDAGES/DRESSINGS) IMPLANT
DURAPREP 26ML APPLICATOR (WOUND CARE) ×2 IMPLANT
Delta lite Plus, Fiberglass cast tape ×4 IMPLANT
ELECTRODE REM PT RTRN 9FT ADLT (ELECTROSURGICAL) ×2 IMPLANT
GAUZE 4X4 16PLY ~~LOC~~+RFID DBL (SPONGE) ×2 IMPLANT
GAUZE PAD ABD 8X10 STRL (GAUZE/BANDAGES/DRESSINGS) ×2 IMPLANT
GAUZE SPONGE 4X4 12PLY STRL (GAUZE/BANDAGES/DRESSINGS) ×2 IMPLANT
GAUZE XEROFORM 1X8 LF (GAUZE/BANDAGES/DRESSINGS) ×2 IMPLANT
GLOVE BIO SURGEON STRL SZ8 (GLOVE) ×4 IMPLANT
GLOVE BIOGEL PI IND STRL 8 (GLOVE) ×2 IMPLANT
GOWN STRL REUS W/ TWL XL LVL3 (GOWN DISPOSABLE) ×4 IMPLANT
IMPL TENDON SHEET TENOGLID 2X2 IMPLANT
KIT TURNOVER CYSTO (KITS) ×2 IMPLANT
NDL HYPO 22X1.5 SAFETY MO (MISCELLANEOUS) ×2 IMPLANT
NDL HYPO 25X1 1.5 SAFETY (NEEDLE) IMPLANT
NEEDLE HYPO 22X1.5 SAFETY MO (MISCELLANEOUS) ×2 IMPLANT
NEEDLE HYPO 25X1 1.5 SAFETY (NEEDLE) IMPLANT
NS IRRIG 1000ML POUR BTL (IV SOLUTION) ×2 IMPLANT
PACK BASIN MAJOR ARMC (MISCELLANEOUS) ×2 IMPLANT
PACK EXTREMITY ARMC (MISCELLANEOUS) ×2 IMPLANT
PAD ABD DERMACEA PRESS 5X9 (GAUZE/BANDAGES/DRESSINGS) IMPLANT
PAD CAST 4YDX4 CTTN HI CHSV (CAST SUPPLIES) ×2 IMPLANT
PENCIL SMOKE EVACUATOR (MISCELLANEOUS) ×4 IMPLANT
RASP SM TEAR CROSS CUT (RASP) ×2 IMPLANT
SLEEVE SCD COMPRESS KNEE MED (STOCKING) ×2 IMPLANT
SOLUTION PREP PVP 2OZ (MISCELLANEOUS) ×4 IMPLANT
SPIKE FLUID TRANSFER (MISCELLANEOUS) ×2 IMPLANT
SPONGE T-LAP 4X18 ~~LOC~~+RFID (SPONGE) ×2 IMPLANT
STAPLER SKIN PROX 35W (STAPLE) ×2 IMPLANT
STOCKINETTE IMPERV 14X48 (MISCELLANEOUS) ×2 IMPLANT
STOCKINETTE STRL 6IN 960660 (GAUZE/BANDAGES/DRESSINGS) ×2 IMPLANT
STRIP CLOSURE SKIN 1/2X4 (GAUZE/BANDAGES/DRESSINGS) IMPLANT
SUCTION TUBE FRAZIER 10FR DISP (SUCTIONS) ×2 IMPLANT
SUT PROLENE 4 0 PS 2 18 (SUTURE) ×6 IMPLANT
SUT VIC AB 3-0 PS2 18XBRD (SUTURE) ×4 IMPLANT
SUT VIC AB 3-0 SH 27X BRD (SUTURE) ×2 IMPLANT
SUT VIC AB 4-0 PS2 27 (SUTURE) ×4 IMPLANT
SYR 10ML LL (SYRINGE) IMPLANT
SYR BULB EAR ULCER 3OZ GRN STR (SYRINGE) ×2 IMPLANT
SYR CONTROL 10ML LL (SYRINGE) ×4 IMPLANT
TAPE CAST 5X4 WHT DELT (MISCELLANEOUS) IMPLANT
TRAP FLUID SMOKE EVACUATOR (MISCELLANEOUS) ×2 IMPLANT
UNDERPAD 30X36 HEAVY ABSORB (UNDERPADS AND DIAPERS) ×2 IMPLANT

## 2024-02-11 NOTE — Anesthesia Procedure Notes (Signed)
 Procedure Name: Intubation Date/Time: 02/11/2024 3:25 PM  Performed by: Gillermo Spruce I, CRNAPre-anesthesia Checklist: Patient identified, Emergency Drugs available, Suction available and Patient being monitored Patient Re-evaluated:Patient Re-evaluated prior to induction Oxygen Delivery Method: Circle system utilized Preoxygenation: Pre-oxygenation with 100% oxygen Induction Type: IV induction Ventilation: Mask ventilation without difficulty Laryngoscope Size: McGrath and 3 Grade View: Grade I Tube type: Oral Tube size: 7.0 mm Number of attempts: 1 Airway Equipment and Method: Stylet Placement Confirmation: ETT inserted through vocal cords under direct vision, positive ETCO2 and breath sounds checked- equal and bilateral Secured at: 21 cm Tube secured with: Tape Dental Injury: Teeth and Oropharynx as per pre-operative assessment

## 2024-02-11 NOTE — Transfer of Care (Signed)
 Immediate Anesthesia Transfer of Care Note  Patient: Tammy Bailey  Procedure(s) Performed: OSTEOTOMY, CALCANEUS (Right: Heel) REPAIR, TENDON, ACHILLES (Right)  Patient Location: PACU  Anesthesia Type:General  Level of Consciousness: awake and drowsy  Airway & Oxygen Therapy: Patient Spontanous Breathing and Patient connected to face mask oxygen  Post-op Assessment: Report given to RN, Post -op Vital signs reviewed and stable, and Patient moving all extremities  Post vital signs: Reviewed and stable  Last Vitals:  Vitals Value Taken Time  BP 120/70 02/11/24 17:00  Temp 36.5 C 02/11/24 17:00  Pulse 73 02/11/24 17:04  Resp 23 02/11/24 17:04  SpO2 100 % 02/11/24 17:04  Vitals shown include unfiled device data.  Last Pain:  Vitals:   02/11/24 1700  TempSrc:   PainSc: Asleep         Complications: There were no known notable events for this encounter.

## 2024-02-11 NOTE — Op Note (Signed)
 OPERATIVE REPORT Patient name: Tammy Bailey MRN: [id number] DOB: Feb 27, 1956  DOS: 02/11/24  Preop Dx: Posterior heel spur right.  Achilles tendinitis right Postop Dx: same  Procedure:  1.  Retrocalcaneal exostectomy right 2.  Repair of Achilles tendon right  Surgeon: Thresa EMERSON Sar DPM  Anesthesia: General anesthesia  Hemostasis: Thigh tourniquet inflated to a pressure of 300 mmHg after esmarch exsanguination   EBL: 10 mL Materials: Paragon 28 all suture anchor x 2.  Bovine collagen tendon graft x 1 Injectables: 50-50 mixture of 2% lidocaine  plain with 0.5% Marcaine  plain totaling 20 mL Pathology: None  Condition: The patient tolerated the procedure and anesthesia well. No complications noted or reported   Justification for procedure: The patient is a 68 y.o. female presenting for surgical correction of chronically painful posterior heel spur right lower extremity with Achilles tendinitis. conservative modalities of been unsuccessful in providing any sort of satisfactory alleviation of symptoms with the patient. The patient was told benefits as well as possible side effects of the surgery. The patient consented for surgical correction. The patient consent form was reviewed. All patient questions were answered. No guarantees were expressed or implied. The patient and the surgeon both signed the patient consent form with the witness present and placed in the patient's chart.   Procedure in Detail: The patient was brought to the operating room, placed in the operating table in the prone position at which time an aseptic scrub and drape were performed about the patient's respective lower extremity after anesthesia was induced as described above. Attention was then directed to the surgical area where procedure number one commenced.  Procedure #1:  retrocalcaneal exostectomy right Attention was directed to the posterior tubercle of the calcaneus where a 5 cm linear longitudinal skin  incision was planned and made overlying the posterior tubercle in a transverse fashion.  The incision was carried down to the level of tendon at which time a transverse tenotomy was performed and the Achilles tendon insertion was reflected away from the posterior tubercle of the calcaneus to expose the underlying prominent heel spur.  A sagittal blade mounted on sagittal saw was utilized to resect away the hypertrophic portion of the calcaneal tubercle in addition to the posterior heel spurs.  It was smoothed with a power rasp and verified by intraoperative x-ray to ensure smooth anatomical contour of the posterior tubercle.  Copious irrigation was then utilized in preparation for repair of the Achilles tendon  Procedure #2: Primary repair Achilles tendon right Attention was then directed to the smooth anatomical contour of the posterior tubercle of the calcaneus where 2 separate all suture anchors were inserted into the posterior tubercle of the calcaneus approximately 2 cm apart.  Inserted in the standard AO fashion.  The distal portion of the Achilles tendon was then sutured and anchored to the posterior tubercle of the calcaneus under normal physiologic tension with the foot in slight plantarflexion.  The insertion of the Achilles was then also reinforced using 2-0 Vicryl suture followed by 3-0 Vicryl suture to reapproximate subcutaneous tissue and 4-0 Prolene to reapproximate superficial skin edges.  Prior to routine layered primary closure insertion of bovine collagen graft was placed at the insertion of the Achilles to promote healing postoperatively.  Dry sterile compressive dressings were then applied to all previously mentioned incision sites about the patient's lower extremity. The tourniquet which was used for hemostasis was deflated. All normal neurovascular responses including pink color and warmth returned all the digits of patient's  lower extremity.  Prior to transfer to the recovery room a  below-knee fiberglass cast was applied.  The patient was then transferred from the operating room to the recovery room having tolerated the procedure and anesthesia well. All vital signs are stable. After a brief stay in the recovery room the patient was readmitted to inpatient room with postoperative orders placed.    Thresa EMERSON Sar, DPM [facility]  Dr. Thresa EMERSON Sar, DPM    [address] [place], [place] [address]                Office [phone]  Fax [phone]

## 2024-02-11 NOTE — Discharge Instructions (Signed)
 Strict nonweightbearing in the below knee fiberglass cast.  Elevate surgical extremity.  Recommend ice behind the knee 94min/hour for the first 24-48 hours.

## 2024-02-11 NOTE — Interval H&P Note (Signed)
 History and Physical Interval Note:  02/11/2024 2:33 PM  Tammy Bailey  has presented today for surgery, with the diagnosis of Achilles tendinitis of right lower extremity Heel spur, right.  The various methods of treatment have been discussed with the patient and family. After consideration of risks, benefits and other options for treatment, the patient has consented to  Procedure(s) with comments: OSTEOTOMY, CALCANEUS (Right) - POSTERIOR, PRONE POSTITION REPAIR, TENDON, ACHILLES (Right) as a surgical intervention.  The patient's history has been reviewed, patient examined, no change in status, stable for surgery.  I have reviewed the patient's chart and labs.  Questions were answered to the patient's satisfaction.     Thresa CHRISTELLA Sar

## 2024-02-11 NOTE — Anesthesia Postprocedure Evaluation (Signed)
 Anesthesia Post Note  Patient: Tammy Bailey  Procedure(s) Performed: OSTEOTOMY, CALCANEUS (Right: Heel) REPAIR, TENDON, ACHILLES (Right)  Patient location during evaluation: PACU Anesthesia Type: General Level of consciousness: awake and alert Pain management: pain level controlled Vital Signs Assessment: post-procedure vital signs reviewed and stable Respiratory status: spontaneous breathing, nonlabored ventilation, respiratory function stable and patient connected to nasal cannula oxygen Cardiovascular status: blood pressure returned to baseline and stable Postop Assessment: no apparent nausea or vomiting Anesthetic complications: no   There were no known notable events for this encounter.   Last Vitals:  Vitals:   02/11/24 1745 02/11/24 1759  BP: 111/66 132/62  Pulse: 67 66  Resp: 19 16  Temp: (!) 36.3 C (!) 36.1 C  SpO2: 95% 96%    Last Pain:  Vitals:   02/11/24 1759  TempSrc: Temporal  PainSc: 5                  Lendia LITTIE Mae

## 2024-02-11 NOTE — Anesthesia Preprocedure Evaluation (Signed)
 Anesthesia Evaluation  Patient identified by MRN, date of birth, ID band Patient awake    Reviewed: Allergy & Precautions, NPO status , Patient's Chart, lab work & pertinent test results  History of Anesthesia Complications (+) PONV and history of anesthetic complications  Airway Mallampati: I  TM Distance: >3 FB Neck ROM: full    Dental no notable dental hx.    Pulmonary neg pulmonary ROS   Pulmonary exam normal        Cardiovascular hypertension, On Medications negative cardio ROS Normal cardiovascular exam     Neuro/Psych  PSYCHIATRIC DISORDERS Anxiety Depression    negative neurological ROS     GI/Hepatic Neg liver ROS,GERD  Controlled,,  Endo/Other  Hypothyroidism  Class 3 obesity  Renal/GU      Musculoskeletal   Abdominal   Peds  Hematology negative hematology ROS (+)   Anesthesia Other Findings Past Medical History: No date: Anxiety No date: Arthritis No date: Depression No date: Dizziness No date: GERD (gastroesophageal reflux disease) No date: Heel spur, right No date: Hyperlipidemia No date: Hypertension No date: Hypothyroidism No date: Morbid obesity with body mass index (BMI) of 40.0 or higher  (HCC) No date: PONV (postoperative nausea and vomiting) No date: Prediabetes 2025: Right Achilles tendinitis No date: Vitamin D  deficiency  Past Surgical History: No date: ABDOMINAL HYSTERECTOMY 2024: ACHILLES TENDON REPAIR; Left No date: APPENDECTOMY No date: BLADDER SURGERY No date: CARPAL TUNNEL RELEASE     Comment:  Both No date: CHOLECYSTECTOMY x's two: KNEE SURGERY; Left 2010: RECONSTRUCTION OF NOSE; Right No date: TONSILLECTOMY     Reproductive/Obstetrics negative OB ROS                              Anesthesia Physical Anesthesia Plan  ASA: 3  Anesthesia Plan: General LMA   Post-op Pain Management:    Induction: Intravenous  PONV Risk Score and  Plan: Dexamethasone , Ondansetron , Midazolam  and Treatment may vary due to age or medical condition  Airway Management Planned: Oral ETT  Additional Equipment:   Intra-op Plan:   Post-operative Plan: Extubation in OR  Informed Consent: I have reviewed the patients History and Physical, chart, labs and discussed the procedure including the risks, benefits and alternatives for the proposed anesthesia with the patient or authorized representative who has indicated his/her understanding and acceptance.     Dental Advisory Given  Plan Discussed with: Anesthesiologist, CRNA and Surgeon  Anesthesia Plan Comments: (Patient consented for risks of anesthesia including but not limited to:  - adverse reactions to medications - damage to eyes, teeth, lips or other oral mucosa - nerve damage due to positioning  - sore throat or hoarseness - Damage to heart, brain, nerves, lungs, other parts of body or loss of life  Patient voiced understanding and assent.)        Anesthesia Quick Evaluation

## 2024-02-11 NOTE — Brief Op Note (Signed)
 02/11/2024  5:20 PM  PATIENT:  Tammy Bailey  68 y.o. female  PRE-OPERATIVE DIAGNOSIS:  Achilles tendinitis of right lower extremity Heel spur, right  POST-OPERATIVE DIAGNOSIS:  Achilles tendinitis of right lower extremityHeel spur, right  PROCEDURE:  Procedure(s) with comments: OSTEOTOMY, CALCANEUS (Right) - POSTERIOR, PRONE POSTITION REPAIR, TENDON, ACHILLES (Right)  SURGEON:  Surgeons and Role:    DEWAINE Janit Thresa CHRISTELLA, DPM - Primary  PHYSICIAN ASSISTANT:   ASSISTANTS: none   ANESTHESIA:   general  EBL:  5 mL   BLOOD ADMINISTERED:none  DRAINS: none   LOCAL MEDICATIONS USED:  MARCAINE    , LIDOCAINE  , and Amount: 20 ml  SPECIMEN:  No Specimen  DISPOSITION OF SPECIMEN:  N/A  COUNTS:  YES  TOURNIQUET:   Total Tourniquet Time Documented: Thigh (Right) - 60 minutes Total: Thigh (Right) - 60 minutes   DICTATION: .Nechama Dictation  PLAN OF CARE: Discharge to home after PACU  PATIENT DISPOSITION:  PACU - hemodynamically stable.   Delay start of Pharmacological VTE agent (>24hrs) due to surgical blood loss or risk of bleeding: no  Thresa EMERSON Janit, DPM [facility]  Dr. Thresa EMERSON Janit, DPM    [address] [place], [place] [address]                Office [phone]  Fax [phone]

## 2024-02-14 ENCOUNTER — Other Ambulatory Visit: Payer: Self-pay | Admitting: Podiatry

## 2024-02-14 MED ORDER — ONDANSETRON 4 MG PO TBDP: 4.0000 mg | ORAL_TABLET | Freq: Three times a day (TID) | ORAL | 0 refills | Status: DC | PRN

## 2024-02-15 ENCOUNTER — Encounter: Payer: Self-pay | Admitting: Podiatry

## 2024-02-16 ENCOUNTER — Encounter: Payer: Self-pay | Admitting: Podiatry

## 2024-02-18 ENCOUNTER — Ambulatory Visit

## 2024-02-18 ENCOUNTER — Ambulatory Visit (INDEPENDENT_AMBULATORY_CARE_PROVIDER_SITE_OTHER): Admitting: Podiatry

## 2024-02-18 ENCOUNTER — Telehealth: Payer: Self-pay | Admitting: Lab

## 2024-02-18 VITALS — Ht 59.0 in | Wt 213.0 lb

## 2024-02-18 DIAGNOSIS — M7661 Achilles tendinitis, right leg: Secondary | ICD-10-CM

## 2024-02-18 DIAGNOSIS — M7731 Calcaneal spur, right foot: Secondary | ICD-10-CM

## 2024-02-18 MED ORDER — OXYCODONE-ACETAMINOPHEN 5-325 MG PO TABS: 1.0000 | ORAL_TABLET | ORAL | 0 refills | Status: DC | PRN

## 2024-02-18 MED ORDER — ONDANSETRON 4 MG PO TBDP: 4.0000 mg | ORAL_TABLET | Freq: Three times a day (TID) | ORAL | 0 refills | Status: AC | PRN

## 2024-02-18 NOTE — Telephone Encounter (Unsigned)
 Copied from CRM [id number]. Topic: Clinical - Lab/Test Results >> Feb 18, 2024  4:23 PM Zebedee SAUNDERS wrote: Reason for CRM: Pt calling for lab results on 02/07/2024, please call pt at [phone]

## 2024-02-18 NOTE — Telephone Encounter (Signed)
 Labs ordered by podiatry - needs to contact them

## 2024-02-18 NOTE — Progress Notes (Signed)
   No chief complaint on file.   Subjective:  Patient presents today status post posterior heel spur resection with repair of Achilles tendon right lower extremity.  DOS: 02/11/2024.  Past Medical History:  Diagnosis Date   Anxiety    Arthritis    Depression    Dizziness    GERD (gastroesophageal reflux disease)    Heel spur, right    Hyperlipidemia    Hypertension    Hypothyroidism    Morbid obesity with body mass index (BMI) of 40.0 or higher (HCC)    PONV (postoperative nausea and vomiting)    Prediabetes    Right Achilles tendinitis 2025   Vitamin D  deficiency     Past Surgical History:  Procedure Laterality Date   ABDOMINAL HYSTERECTOMY     ACHILLES TENDON REPAIR Left 2024   ACHILLES TENDON SURGERY Right 02/11/2024   Procedure: REPAIR, TENDON, ACHILLES;  Surgeon: Janit Thresa HERO, DPM;  Location: [facility] ORS;  Service: Orthopedics/Podiatry;  Laterality: Right;   APPENDECTOMY     BLADDER SURGERY     CALCANEAL OSTEOTOMY Right 02/11/2024   Procedure: OSTEOTOMY, CALCANEUS;  Surgeon: Janit Thresa HERO, DPM;  Location: [facility];  Service: Orthopedics/Podiatry;  Laterality: Right;  POSTERIOR, PRONE POSTITION   CARPAL TUNNEL RELEASE     Both   CHOLECYSTECTOMY     KNEE SURGERY Left x's two   RECONSTRUCTION OF NOSE Right 2010   TONSILLECTOMY      Allergies  Allergen Reactions   Gabapentin  Other (See Comments)    Pt stated she became aggressive and was wanting to fight.    Erythromycin Nausea And Vomiting    Passed out     Objective/Physical Exam Below-knee fiberglass cast was left intact today.  Capillary refill to the toes immediate.  She is able to wiggle her toes.  No paresthesia or numbness.  No abrasions or irritations from the cast  Radiographic Exam RT ankle 02/18/2024:  Interval resection of the posterior heel spur noted on lateral view.  Smooth anatomical contour.  Assessment: 1. s/p retrocalcaneal exostectomy with repair of Achilles tendon right. DOS:  02/11/2024   Plan of Care:  -Patient was evaluated. X-rays reviewed - Continue strict NWB using the knee scooter in the fiberglass cast -Refill prescription for Percocet 5/325 mg as well as OTC Zofran  4 mg -Return to clinic 3 weeks cast removal   Thresa EMERSON Janit, DPM [facility]  Dr. Thresa EMERSON Janit, DPM    [address] [place], [place] [address]                Office [phone]  Fax [phone]

## 2024-02-18 NOTE — Telephone Encounter (Signed)
 Patient  returned to work yesterday after surgery and now having pain and swelling daughter is very concerned states her mother will not tell provider what she has done by returning to work and not following post op directions.

## 2024-02-18 NOTE — Telephone Encounter (Signed)
Please see the message below and advise.

## 2024-02-21 NOTE — Telephone Encounter (Signed)
 Advised

## 2024-02-22 ENCOUNTER — Telehealth: Payer: Self-pay

## 2024-02-22 NOTE — Telephone Encounter (Signed)
 PA reqeust received from [facility] for Oxycodone /Acetaminophen  5/325 mg. PA submitted through CoverMyMeds and waiting on response.  JOYCE SUMNER [place]  (Key: [id number]) PA Case ID #: [id number]

## 2024-02-25 ENCOUNTER — Encounter: Admitting: Podiatry

## 2024-02-25 ENCOUNTER — Emergency Department
Admission: EM | Admit: 2024-02-25 | Discharge: 2024-02-25 | Disposition: A | Discharge status: Home / Self Care | Attending: Emergency Medicine | Admitting: Emergency Medicine

## 2024-02-25 ENCOUNTER — Other Ambulatory Visit: Payer: Self-pay

## 2024-02-25 ENCOUNTER — Ambulatory Visit: Payer: Self-pay

## 2024-02-25 ENCOUNTER — Emergency Department

## 2024-02-25 DIAGNOSIS — I10 Essential (primary) hypertension: Secondary | ICD-10-CM | POA: Insufficient documentation

## 2024-02-25 DIAGNOSIS — N3001 Acute cystitis with hematuria: Secondary | ICD-10-CM | POA: Insufficient documentation

## 2024-02-25 DIAGNOSIS — N281 Cyst of kidney, acquired: Secondary | ICD-10-CM | POA: Diagnosis not present

## 2024-02-25 DIAGNOSIS — R319 Hematuria, unspecified: Secondary | ICD-10-CM | POA: Diagnosis present

## 2024-02-25 DIAGNOSIS — D72829 Elevated white blood cell count, unspecified: Secondary | ICD-10-CM | POA: Diagnosis not present

## 2024-02-25 DIAGNOSIS — R310 Gross hematuria: Secondary | ICD-10-CM

## 2024-02-25 LAB — BASIC METABOLIC PANEL WITH GFR
Anion gap: 11 (ref 5–15)
BUN: 26 mg/dL — ABNORMAL HIGH (ref 8–23)
CO2: 27 mmol/L (ref 22–32)
Calcium: 9.7 mg/dL (ref 8.9–10.3)
Chloride: 104 mmol/L (ref 98–111)
Creatinine, Ser: 1.3 mg/dL — ABNORMAL HIGH (ref 0.44–1.00)
GFR, Estimated: 45 mL/min — ABNORMAL LOW (ref >60)
Glucose, Bld: 110 mg/dL — ABNORMAL HIGH (ref 70–99)
Potassium: 3.6 mmol/L (ref 3.5–5.1)
Sodium: 142 mmol/L (ref 135–145)

## 2024-02-25 LAB — CBC
HCT: 38.3 % (ref 36.0–46.0)
Hemoglobin: 13 g/dL (ref 12.0–15.0)
MCH: 30.7 pg (ref 26.0–34.0)
MCHC: 33.9 g/dL (ref 30.0–36.0)
MCV: 90.3 fL (ref 80.0–100.0)
Platelets: 334 K/uL (ref 150–400)
RBC: 4.24 MIL/uL (ref 3.87–5.11)
RDW: 12.1 % (ref 11.5–15.5)
WBC: 10.7 K/uL — ABNORMAL HIGH (ref 4.0–10.5)
nRBC: 0 % (ref 0.0–0.2)

## 2024-02-25 LAB — URINALYSIS, ROUTINE W REFLEX MICROSCOPIC
Bacteria, UA: NONE SEEN
Bilirubin Urine: NEGATIVE
Glucose, UA: NEGATIVE mg/dL
Ketones, ur: NEGATIVE mg/dL
Nitrite: NEGATIVE
Protein, ur: 100 mg/dL — AB
RBC / HPF: >50 RBC/hpf (ref 0–5)
Specific Gravity, Urine: 1.014 (ref 1.005–1.030)
WBC, UA: >50 WBC/hpf (ref 0–5)
pH: 6 (ref 5.0–8.0)

## 2024-02-25 MED ORDER — CEPHALEXIN 500 MG PO CAPS: 500.0000 mg | ORAL_CAPSULE | Freq: Two times a day (BID) | ORAL | 0 refills | Status: AC

## 2024-02-25 MED ORDER — SODIUM CHLORIDE 0.9 % IV BOLUS
1000.0000 mL | Freq: Once | INTRAVENOUS | Status: AC
  Administered 2024-02-25: 1000 mL via INTRAVENOUS

## 2024-02-25 NOTE — ED Provider Notes (Signed)
 [facility] Provider Note    Event Date/Time   First MD Initiated Contact with Patient 02/25/24 1649     (approximate)   History   Hematuria   HPI  Tammy Bailey is a 67 y.o. female with PMH of anxiety, hypertension, prediabetes presents for evaluation of hematuria.  Patient states that she noticed frank blood in her urine this morning.  She reports having some urinary frequency and urgency as well as some pressure with urination. Denies flank pain, fever.       Physical Exam   Triage Vital Signs: ED Triage Vitals [02/25/24 1605]  Encounter Vitals Group     BP 134/83     Girls Systolic BP Percentile      Girls Diastolic BP Percentile      Boys Systolic BP Percentile      Boys Diastolic BP Percentile      Pulse Rate 80     Resp 16     Temp 98.2 F (36.8 C)     Temp Source Oral     SpO2 96 %     Weight      Height 4' 11 (1.499 m)     Head Circumference      Peak Flow      Pain Score 10     Pain Loc      Pain Education      Exclude from Growth Chart     Most recent vital signs: Vitals:   02/25/24 1957 02/25/24 2200  BP: (!) 147/63 (!) 114/54  Pulse: 68 72  Resp: 18 16  Temp: 97.8 F (36.6 C) 97.8 F (36.6 C)  SpO2: 100% 99%   General: Awake, no distress.  CV:  Good peripheral perfusion. RRR. Resp:  Normal effort. CTAB. Abd:  No distention. Soft, non-tender, no CVA tenderness. Other:     ED Results / Procedures / Treatments   Labs (all labs ordered are listed, but only abnormal results are displayed) Labs Reviewed  URINALYSIS, ROUTINE W REFLEX MICROSCOPIC - Abnormal; Notable for the following components:      Result Value   Color, Urine RED (*)    APPearance CLOUDY (*)    Hgb urine dipstick LARGE (*)    Protein, ur 100 (*)    Leukocytes,Ua LARGE (*)    All other components within normal limits  BASIC METABOLIC PANEL WITH GFR - Abnormal; Notable for the following components:   Glucose, Bld 110 (*)    BUN 26 (*)     Creatinine, Ser 1.30 (*)    GFR, Estimated 45 (*)    All other components within normal limits  CBC - Abnormal; Notable for the following components:   WBC 10.7 (*)    All other components within normal limits  URINE CULTURE     RADIOLOGY  CT renal stone study obtained, interpreted the images as well as reviewed the radiologist report.  CT scan showed cyst on the upper pole of left kidney as well as mild left hydronephrosis or parapelvic cysts.  No stones.  Radiology interpretation below.  IMPRESSION:  1. 8 mm exophytic rounded abnormality is seen arising from upper  pole of left kidney which may represent complex cyst, but renal  ultrasound is recommended to rule out mass.  2. Possible mild left hydronephrosis or parapelvic cysts are noted.  No ureteral dilatation is noted.    PROCEDURES:  Critical Care performed: No  Procedures   MEDICATIONS ORDERED IN ED: Medications  sodium chloride  0.9 % bolus 1,000 mL (0 mLs Intravenous Stopped 02/25/24 2158)     IMPRESSION / MDM / ASSESSMENT AND PLAN / ED COURSE  I reviewed the triage vital signs and the nursing notes.                             68 year old female presents for evaluation of hematuria.  Vital signs are stable patient NAD on exam.  Differential diagnosis includes, but is not limited to, UTI, nephrolithiasis, ureterolithiasis, pyelonephritis, bladder cancer.  Patient's presentation is most consistent with acute complicated illness / injury requiring diagnostic workup.  CBC shows mildly elevated white count at 10.7.  BMP notable for elevated BUN and creatinine with a decreased GFR consistent with an AKI.  Urine shows presence of hemoglobin, protein, large leukocytes, RBCs and WBCs without bacteria.  Patient's symptoms may be due to a UTI but given the amount of blood in the urine do want to rule out a kidney stone so we will obtain a renal stone study.  If negative will treat patient for the UTI with oral  antibiotics.  CT renal stone study showed cyst on the kidneys which may explain the hematuria.  Did speak with the on-call urologist in regards to these results who recommended outpatient follow-up with them and was okay with antibiotics for treatment of possible UTI.  Will also send urine for culture.  Patient made aware of this.  She voiced understanding, all questions were answered.  Patient was given IV fluids for her AKI and was discharged in stable condition after receiving these.      FINAL CLINICAL IMPRESSION(S) / ED DIAGNOSES   Final diagnoses:  Gross hematuria  Renal cyst  Acute cystitis with hematuria     Rx / DC Orders   ED Discharge Orders          Ordered    cephALEXin  (KEFLEX ) 500 MG capsule  2 times daily        02/25/24 2142             Note:  This document was prepared using Dragon voice recognition software and may include unintentional dictation errors.   Cleaster Tinnie LABOR, PA-C 02/25/24 2317    Floy Roberts, MD 02/26/24 [phone]

## 2024-02-25 NOTE — ED Notes (Signed)
 Two failed attempts for blood. Lab called.

## 2024-02-25 NOTE — Telephone Encounter (Signed)
 FYI Only or Action Required?: FYI only for provider.  Patient was last seen in primary care on 02/02/2024 by Wellington Curtis LABOR, FNP.  Called Nurse Triage reporting Urinary Frequency, Dysuria, and Hematuria.  Symptoms began today.  Interventions attempted: Rest, hydration, or home remedies.  Symptoms are: rapidly worsening.  Triage Disposition: Go to ED Now (Notify PCP)  Patient/caregiver understands and will follow disposition?: Yes      Copied from CRM #[id number]. Topic: Clinical - Red Word Triage >> Feb 25, 2024  3:33 PM Sophia H wrote: Red Word that prompted transfer to Nurse Triage: Patient states she has blood in urine, very painful when she uses the restroom, lot of pressure & going really often. Just had a surgery 08/29.   Reason for Disposition  [1] Unable to urinate (or only a few drops) > 4 hours AND [2] bladder feels very full (e.g., palpable bladder or strong urge to urinate)  Answer Assessment - Initial Assessment Questions Advised pt go to ED right away, pt confirms she has a ride who will take her there shortly. Advised 911 if feel super weak or heart racing.      1. SEVERITY: How bad is the pain?  (e.g., Scale 1-10; mild, moderate, or severe)     Feels like having a baby 10/10 when urinating 2. FREQUENCY: How many times have you had painful urination today?      All day was fine just hit me in last hour 4. ONSET: When did the painful urination start?      Just started 5. FEVER: Do you have a fever? If Yes, ask: What is your temperature, how was it measured, and when did it start?     no 6. PAST UTI: Have you had a urine infection before? If Yes, ask: When was the last time? and What happened that time?      Past UTI 7. CAUSE: What do you think is causing the painful urination?  (e.g., UTI, scratch, Herpes sore)     UTI 8. OTHER SYMPTOMS: Do you have any other symptoms? (e.g., blood in urine, flank pain, genital sores, urgency, vaginal  discharge)        Blood in urine Drops of blood in bottom of commode No clots yet No flank pain Just had surgery 8/29  Been waiting for ride, unrelenting pressure like got to pee again, feels full, not getting full streams of urine, trickle here and there, 3-4x since 3 pm just been sitting here on toilet little drops at a time still feels like more to go pain builds eases a little then builds again right away Feels full, whenever sit down feel more pressure Pt grunting through pain, at times having hard time talking through pain  Protocols used: Urination Pain - [facility]

## 2024-02-25 NOTE — ED Triage Notes (Signed)
 Pt to ed from work via POV for blood in the urine and pain during urination that started today. Pt is caox4, in no acute distress in triage.

## 2024-02-25 NOTE — ED Notes (Signed)
 Patient taken to CT scan.

## 2024-02-25 NOTE — Telephone Encounter (Signed)
  Duplicate encounter. See other Nurse Triage    Message from [facility] sent at 02/25/2024  3:09 PM EDT  Summary: urinary concern / rx req   The patient shares that for the past hour they have experienced frequent urination and pressure. The patient would like to be prescribed something to help with their discomfort/concern and shares that they have recently had achilles surgery making their mobility to the bathroom even more difficult. Please contact further when possible      This encounter was created in error - please disregard.

## 2024-02-25 NOTE — Discharge Instructions (Addendum)
 Your blood work showed an elevated white blood cell count which is seen when there is an infection.  Your creatinine was elevated which indicates a kidney injury.  This was corrected by giving you IV fluids today.  The urinalysis shows blood in the urine as well as some white blood cells which may be indicative of UTI.  The blood in your urine may be coming from an infection but the CT scan showed cysts on your kidneys which may also explain the bleeding.  Please take the antibiotics as prescribed as this will treat any infection.  Your urine is also been sent for culture to confirm this.  It is very important that you follow-up with urology.  Their information has been attached.  You will need to call to schedule but they should be aware you will be reaching out.  Return to the emergency department with any worsening symptoms like development of fever or back pain.

## 2024-02-28 LAB — URINE CULTURE: Culture: 10000 — AB

## 2024-02-29 ENCOUNTER — Ambulatory Visit (INDEPENDENT_AMBULATORY_CARE_PROVIDER_SITE_OTHER): Admitting: Podiatry

## 2024-02-29 DIAGNOSIS — M7731 Calcaneal spur, right foot: Secondary | ICD-10-CM | POA: Diagnosis not present

## 2024-02-29 NOTE — Progress Notes (Signed)
   Chief Complaint  Patient presents with   Heel Spurs    Post Op cast is loose    Subjective:  Patient presents today status post posterior heel spur resection with repair of Achilles tendon right lower extremity.  DOS: 02/11/2024.  Patient states that she feels as if the cast is too loose.  She feels that her ankle is moving within the cast.  Past Medical History:  Diagnosis Date   Anxiety    Arthritis    Depression    Dizziness    GERD (gastroesophageal reflux disease)    Heel spur, right    Hyperlipidemia    Hypertension    Hypothyroidism    Morbid obesity with body mass index (BMI) of 40.0 or higher (HCC)    PONV (postoperative nausea and vomiting)    Prediabetes    Right Achilles tendinitis 2025   Vitamin D  deficiency     Past Surgical History:  Procedure Laterality Date   ABDOMINAL HYSTERECTOMY     ACHILLES TENDON REPAIR Left 2024   ACHILLES TENDON SURGERY Right 02/11/2024   Procedure: REPAIR, TENDON, ACHILLES;  Surgeon: Janit Thresa HERO, DPM;  Location: [facility] ORS;  Service: Orthopedics/Podiatry;  Laterality: Right;   APPENDECTOMY     BLADDER SURGERY     CALCANEAL OSTEOTOMY Right 02/11/2024   Procedure: OSTEOTOMY, CALCANEUS;  Surgeon: Janit Thresa HERO, DPM;  Location: [facility] ORS;  Service: Orthopedics/Podiatry;  Laterality: Right;  POSTERIOR, PRONE POSTITION   CARPAL TUNNEL RELEASE     Both   CHOLECYSTECTOMY     KNEE SURGERY Left x's two   RECONSTRUCTION OF NOSE Right 2010   TONSILLECTOMY      Allergies  Allergen Reactions   Gabapentin  Other (See Comments)    Pt stated she became aggressive and was wanting to fight.    Erythromycin Nausea And Vomiting    Passed out     Objective/Physical Exam Incision is well coapted with sutures intact.  No abrasions noted.  Minimal edema.  Overall well-healing surgical foot  Radiographic Exam RT ankle 02/18/2024:  Interval resection of the posterior heel spur noted on lateral view.  Smooth anatomical contour.  Assessment: 1.  s/p retrocalcaneal exostectomy with repair of Achilles tendon right. DOS: 02/11/2024   Plan of Care:  -Patient was evaluated. -The below-knee fiberglass cast was bivalved and removed today -Dressings reapplied -Cam boot with heel lift was applied.  Leave clean dry and intact -Return to clinic next scheduled appointment for suture removal  Thresa EMERSON Janit, DPM [facility]  Dr. Thresa EMERSON Janit, DPM    [address] [place], [place] [address]                Office [phone]  Fax [phone]

## 2024-03-07 ENCOUNTER — Encounter: Admitting: Podiatry

## 2024-03-10 ENCOUNTER — Encounter: Admitting: Podiatry

## 2024-03-14 ENCOUNTER — Encounter: Payer: Self-pay | Admitting: Podiatry

## 2024-03-14 ENCOUNTER — Ambulatory Visit (INDEPENDENT_AMBULATORY_CARE_PROVIDER_SITE_OTHER): Admitting: Podiatry

## 2024-03-14 VITALS — Ht 59.0 in | Wt 213.0 lb

## 2024-03-14 DIAGNOSIS — M7731 Calcaneal spur, right foot: Secondary | ICD-10-CM

## 2024-03-14 NOTE — Progress Notes (Signed)
   No chief complaint on file.  Subjective:  Patient presents today status post posterior heel spur resection with repair of Achilles tendon right lower extremity.  DOS: 02/11/2024.    Past Medical History:  Diagnosis Date   Anxiety    Arthritis    Depression    Dizziness    GERD (gastroesophageal reflux disease)    Heel spur, right    Hyperlipidemia    Hypertension    Hypothyroidism    Morbid obesity with body mass index (BMI) of 40.0 or higher (HCC)    PONV (postoperative nausea and vomiting)    Prediabetes    Right Achilles tendinitis 2025   Vitamin D  deficiency     Past Surgical History:  Procedure Laterality Date   ABDOMINAL HYSTERECTOMY     ACHILLES TENDON REPAIR Left 2024   ACHILLES TENDON SURGERY Right 02/11/2024   Procedure: REPAIR, TENDON, ACHILLES;  Surgeon: Janit Thresa HERO, DPM;  Location: [facility] OR[facility];  Service: Orthopedics/Podiatry;  Laterality: Right;   APPENDECTOMY     BLADDER SURGERY     CALCANEAL OSTEOTOMY Right 02/11/2024   Procedure: OSTEOTOMY, CALCANEUS;  Surgeon: Janit Thresa HERO, DPM;  Location: [facility] OR[facility];  Service: Orthopedics/Podiatry;  Laterality: Right;  POSTERIOR, PRONE POSTITION   CARPAL TUNNEL RELEASE     Both   CHOLECYSTECTOMY     KNEE SURGERY Left x's two   RECONSTRUCTION OF NOSE Right 2010   TONSILLECTOMY      Allergies  Allergen Reactions   Gabapentin  Other (See Comments)    Pt stated she became aggressive and was wanting to fight.    Erythromycin Nausea And Vomiting    Passed out     Objective/Physical Exam Incision is well coapted with sutures intact.  No.  Overall well-healing surgical foot  Radiographic Exam RT ankle 02/18/2024:  Interval resection of the posterior heel spur noted on lateral view.  Smooth anatomical contour.  Assessment: 1. s/p retrocalcaneal exostectomy with repair of Achilles tendon right. DOS: 02/11/2024   Plan of Care:  -Patient was evaluated. -Sutures removed -Begin WBAT cam boot with a heel lift -Begin  passive range of motion exercises -Return to clinic 4 weeks   Thresa EMERSON Janit, DPM [facility]  Dr. Thresa EMERSON Janit, DPM    [address] [place], [place] [address]                Office [phone]  Fax [phone]

## 2024-03-17 ENCOUNTER — Encounter: Admitting: Podiatry

## 2024-03-21 ENCOUNTER — Ambulatory Visit: Admitting: Urology

## 2024-03-21 ENCOUNTER — Encounter: Payer: Self-pay | Admitting: Urology

## 2024-03-21 VITALS — BP 117/73 | HR 81 | Ht 59.0 in | Wt 209.0 lb

## 2024-03-21 DIAGNOSIS — R310 Gross hematuria: Secondary | ICD-10-CM | POA: Diagnosis not present

## 2024-03-21 LAB — URINALYSIS, COMPLETE
Bilirubin, UA: NEGATIVE
Glucose, UA: NEGATIVE
Ketones, UA: NEGATIVE
Leukocytes,UA: NEGATIVE
Nitrite, UA: NEGATIVE
Protein,UA: NEGATIVE
RBC, UA: NEGATIVE
Specific Gravity, UA: 1.03 (ref 1.005–1.030)
Urobilinogen, Ur: 0.2 mg/dL (ref 0.2–1.0)
pH, UA: 6 (ref 5.0–7.5)

## 2024-03-21 LAB — MICROSCOPIC EXAMINATION: Epithelial Cells (non renal): >10 /HPF — AB (ref 0–10)

## 2024-03-21 NOTE — Patient Instructions (Signed)
 Please contact Central Scheduling to set up your CT at [phone].   Cystoscopy Cystoscopy is a procedure that is used to help diagnose and sometimes treat conditions that affect the lower urinary tract. The lower urinary tract includes the bladder and the urethra. The urethra is the tube that drains urine from the bladder. Cystoscopy is done using a thin, tube-shaped instrument with a light and camera at the end (cystoscope). The cystoscope may be hard or flexible, depending on the goal of the procedure. The cystoscope is inserted through the urethra, into the bladder. Cystoscopy may be recommended if you have: Urinary tract infections that keep coming back. Blood in the urine (hematuria). An inability to control when you urinate (urinary incontinence) or an overactive bladder. Unusual cells found in a urine sample. A blockage in the urethra, such as a urinary stone. Painful urination. An abnormality in the bladder found during an intravenous pyelogram (IVP) or CT scan. What are the risks? Generally, this is a safe procedure. However, problems may occur, including: Infection. Bleeding.  What happens during the procedure?  You will be given one or more of the following: A medicine to numb the area (local anesthetic). The area around the opening of your urethra will be cleaned. The cystoscope will be passed through your urethra into your bladder. Germ-free (sterile) fluid will flow through the cystoscope to fill your bladder. The fluid will stretch your bladder so that your health care provider can clearly examine your bladder walls. Your doctor will look at the urethra and bladder. The cystoscope will be removed The procedure may vary among health care providers  What can I expect after the procedure? After the procedure, it is common to have: Some soreness or pain in your urethra. Urinary symptoms. These include: Mild pain or burning when you urinate. Pain should stop within a few  minutes after you urinate. This may last for up to a few days after the procedure. A small amount of blood in your urine for several days. Feeling like you need to urinate but producing only a small amount of urine. Follow these instructions at home: General instructions Return to your normal activities as told by your health care provider.  Drink plenty of fluids after the procedure. Keep all follow-up visits as told by your health care provider. This is important. Contact a health care provider if you: Have pain that gets worse or does not get better with medicine, especially pain when you urinate lasting longer than 72 hours after the procedure. Have trouble urinating. Get help right away if you: Have blood clots in your urine. Have a fever or chills. Are unable to urinate. Summary Cystoscopy is a procedure that is used to help diagnose and sometimes treat conditions that affect the lower urinary tract. Cystoscopy is done using a thin, tube-shaped instrument with a light and camera at the end. After the procedure, it is common to have some soreness or pain in your urethra. It is normal to have blood in your urine after the procedure.  If you were prescribed an antibiotic medicine, take it as told by your health care provider.  This information is not intended to replace advice given to you by your health care provider. Make sure you discuss any questions you have with your health care provider. Document Revised: 05/24/2018 Document Reviewed: 05/24/2018 Elsevier Patient Education  2020 [employer].

## 2024-03-21 NOTE — Progress Notes (Signed)
 03/21/2024 8:14 AM   Rojelio JINNY Hint 26-Sep-1955 [id number]  Referring provider: [place] Curtis LABOR, FNP [address] [place],  [place] [address]  Chief Complaint  Patient presents with   Hematuria    HPI: Tammy Bailey is a 68 y.o. fe female referred for evaluation and management of hematuria  ED visit 02/25/2024 for episode of total gross painless hematuria Urinalysis: Grossly bloody greater than sign 50 WBC/>50 RBC; 11-20 squamous epis; urine culture 10,000 colonies E. coli Gross hematuria: Positive Associated lower urinary tract symptoms: None Baseline lower urinary tract symptoms: None Pain: Pelvic discomfort Prior urologic history: History recurrent UTI; no prior urologic evaluation Blood thinners/antiplatelet meds: None Tobacco history: None CT renal stone study was performed which showed an 8 mm exophytic rounded abnormality seen from the upper pole of the left kidney.  Mild hydronephrosis versus parapelvic cyst   PMH: Past Medical History:  Diagnosis Date   Anxiety    Arthritis    Depression    Dizziness    GERD (gastroesophageal reflux disease)    Heel spur, right    Hyperlipidemia    Hypertension    Hypothyroidism    Morbid obesity with body mass index (BMI) of 40.0 or higher (HCC)    PONV (postoperative nausea and vomiting)    Prediabetes    Right Achilles tendinitis 2025   Vitamin D  deficiency     Surgical History: Past Surgical History:  Procedure Laterality Date   ABDOMINAL HYSTERECTOMY     ACHILLES TENDON REPAIR Left 2024   ACHILLES TENDON SURGERY Right 02/11/2024   Procedure: REPAIR, TENDON, ACHILLES;  Surgeon: Janit Thresa HERO, DPM;  Location: [facility];  Service: Orthopedics/Podiatry;  Laterality: Right;   APPENDECTOMY     BLADDER SURGERY     CALCANEAL OSTEOTOMY Right 02/11/2024   Procedure: OSTEOTOMY, CALCANEUS;  Surgeon: Janit Thresa HERO, DPM;  Location: [facility];  Service: Orthopedics/Podiatry;  Laterality: Right;  POSTERIOR, PRONE  POSTITION   CARPAL TUNNEL RELEASE     Both   CHOLECYSTECTOMY     KNEE SURGERY Left x's two   RECONSTRUCTION OF NOSE Right 2010   TONSILLECTOMY      Home Medications:  Allergies as of 03/21/2024       Reactions   Gabapentin  Other (See Comments)   Pt stated she became aggressive and was wanting to fight.    Erythromycin Nausea And Vomiting   Passed out         Medication List        Accurate as of March 21, 2024  8:14 AM. If you have any questions, ask your nurse or doctor.          amLODipine  5 MG tablet Commonly known as: Norvasc  Take 1 tablet (5 mg total) by mouth daily.   aspirin  325 MG tablet Commonly known as: Bayer Aspirin  Take 1 tablet (325 mg total) by mouth daily.   esomeprazole 20 MG packet Commonly known as: NEXIUM Take 20 mg by mouth daily as needed (acid reflux).   estradiol  1 MG tablet Commonly known as: ESTRACE  Take 1 tablet (1 mg total) by mouth daily.   ezetimibe  10 MG tablet Commonly known as: Zetia  Take 1 tablet (10 mg total) by mouth daily.   ibuprofen  800 MG tablet Commonly known as: ADVIL  Take 1 tablet (800 mg total) by mouth 3 (three) times daily.   levothyroxine  150 MCG tablet Commonly known as: Synthroid  Take 1 tablet (150 mcg total) by mouth daily before breakfast.  meclizine  25 MG tablet Commonly known as: ANTIVERT  Take 1 tablet (25 mg total) by mouth 3 (three) times daily as needed for dizziness.   multivitamin with minerals tablet Take 1 tablet by mouth daily.   ondansetron  4 MG disintegrating tablet Commonly known as: ZOFRAN -ODT Take 1 tablet (4 mg total) by mouth every 8 (eight) hours as needed for nausea or vomiting.   oxyCODONE -acetaminophen  5-325 MG tablet Commonly known as: Percocet Take 1 tablet by mouth every 4 (four) hours as needed for severe pain (pain score 7-10).   valsartan -hydrochlorothiazide  160-25 MG tablet Commonly known as: DIOVAN -HCT Take 1 tablet by mouth daily.        Allergies:   Allergies  Allergen Reactions   Gabapentin  Other (See Comments)    Pt stated she became aggressive and was wanting to fight.    Erythromycin Nausea And Vomiting    Passed out     Family History: Family History  Problem Relation Age of Onset   Hypertension Father    Diabetes Sister     Social History:  reports that she has never smoked. She has never used smokeless tobacco. She reports that she does not drink alcohol and does not use drugs.   Physical Exam: BP 117/73   Pulse 81   Ht 4' 11 (1.499 m)   Wt 209 lb (94.8 kg)   BMI 42.21 kg/m   Constitutional:  Alert and oriented, No acute distress. HEENT: [place] AT. Respiratory: Normal respiratory effort, no increased work of breathing. GI: Abdomen is soft, nontender, nondistended, no abdominal masses GU: No CVA tenderness Psychiatric: Normal mood and affect.  Laboratory Data:  Urinalysis Dipstick negative/microscopy >10 epis  Pertinent Imaging: CT images were personally reviewed and interpreted   CT Renal Stone Study  Narrative CLINICAL DATA:  Hematuria, dysuria.  EXAM: CT ABDOMEN AND PELVIS WITHOUT CONTRAST  TECHNIQUE: Multidetector CT imaging of the abdomen and pelvis was performed following the standard protocol without IV contrast.  RADIATION DOSE REDUCTION: This exam was performed according to the departmental dose-optimization program which includes automated exposure control, adjustment of the mA and/or kV according to patient size and/or use of iterative reconstruction technique.  COMPARISON:  None Available.  FINDINGS: Lower chest: No acute abnormality.  Hepatobiliary: No focal liver abnormality is seen. Status post cholecystectomy. No biliary dilatation.  Pancreas: Unremarkable. No pancreatic ductal dilatation or surrounding inflammatory changes.  Spleen: Normal in size without focal abnormality.  Adrenals/Urinary Tract: Adrenal glands appear normal. No renal or ureteral calculi are noted.  Urinary bladder is decompressed. 8 mm exophytic rounded abnormality is seen arising from upper pole of left kidney which may represent complex cyst, but renal ultrasound is recommended to rule out mass. Possible mild left hydronephrosis or parapelvic cysts are noted. No ureteral dilatation is noted.  Stomach/Bowel: The stomach is unremarkable. Status post appendectomy. No evidence of bowel obstruction or inflammation.  Vascular/Lymphatic: No significant vascular findings are present. No enlarged abdominal or pelvic lymph nodes.  Reproductive: Status post hysterectomy. No adnexal masses.  Other: No ascites or hernia is noted.  Musculoskeletal: No acute or significant osseous findings.  IMPRESSION: 1. 8 mm exophytic rounded abnormality is seen arising from upper pole of left kidney which may represent complex cyst, but renal ultrasound is recommended to rule out mass. 2. Possible mild left hydronephrosis or parapelvic cysts are noted. No ureteral dilatation is noted.   Electronically Signed By: Lynwood Landy Raddle M.D. On: 02/25/2024 18:09   Assessment & Plan:    1.  Gross hematuria AUA risk stratification: High We discussed the recommended evaluation of high risk hematuria which consist of CT urogram and cystoscopy.  The procedures were discussed in detail and she has elected to proceed with further evaluation All questions were answered CTU order placed and cystoscopy was scheduled    Glendia JAYSON Barba, MD  [facility] [address] [place], [place] [address] [phone]

## 2024-03-24 ENCOUNTER — Telehealth: Payer: Self-pay | Admitting: Lab

## 2024-03-24 ENCOUNTER — Other Ambulatory Visit: Payer: Self-pay | Admitting: Podiatry

## 2024-03-24 MED ORDER — OXYCODONE-ACETAMINOPHEN 5-325 MG PO TABS: 1.0000 | ORAL_TABLET | ORAL | 0 refills | Status: DC | PRN

## 2024-03-24 NOTE — Telephone Encounter (Signed)
 Patient requesting oxyCODONE -acetaminophen  (PERCOCET) 5-325 MG tablet refill.

## 2024-04-11 ENCOUNTER — Ambulatory Visit (INDEPENDENT_AMBULATORY_CARE_PROVIDER_SITE_OTHER): Admitting: Podiatry

## 2024-04-11 ENCOUNTER — Encounter: Payer: Self-pay | Admitting: Podiatry

## 2024-04-11 VITALS — Ht 59.0 in | Wt 209.0 lb

## 2024-04-11 DIAGNOSIS — M7731 Calcaneal spur, right foot: Secondary | ICD-10-CM

## 2024-04-11 NOTE — Progress Notes (Signed)
   Chief Complaint  Patient presents with   Routine Post Op    POV #[id number] 02/11/24 RT ACHILLES TENDON REPAIR, RT POSTERIOR HEEL SPUR RESECTION, Pt states everything is going well, continues to wear boot as instructed, states still some swelling when she is on her foot a lot.   Subjective:  Patient presents today status post posterior heel spur resection with repair of Achilles tendon right lower extremity.  DOS: 02/11/2024.    Past Medical History:  Diagnosis Date   Anxiety    Arthritis    Depression    Dizziness    GERD (gastroesophageal reflux disease)    Heel spur, right    Hyperlipidemia    Hypertension    Hypothyroidism    Morbid obesity with body mass index (BMI) of 40.0 or higher (HCC)    PONV (postoperative nausea and vomiting)    Prediabetes    Right Achilles tendinitis 2025   Vitamin D  deficiency     Past Surgical History:  Procedure Laterality Date   ABDOMINAL HYSTERECTOMY     ACHILLES TENDON REPAIR Left 2024   ACHILLES TENDON SURGERY Right 02/11/2024   Procedure: REPAIR, TENDON, ACHILLES;  Surgeon: Janit Thresa HERO, DPM;  Location: [facility];  Service: Orthopedics/Podiatry;  Laterality: Right;   APPENDECTOMY     BLADDER SURGERY     CALCANEAL OSTEOTOMY Right 02/11/2024   Procedure: OSTEOTOMY, CALCANEUS;  Surgeon: Janit Thresa HERO, DPM;  Location: [facility];  Service: Orthopedics/Podiatry;  Laterality: Right;  POSTERIOR, PRONE POSTITION   CARPAL TUNNEL RELEASE     Both   CHOLECYSTECTOMY     KNEE SURGERY Left x's two   RECONSTRUCTION OF NOSE Right 2010   TONSILLECTOMY      Allergies  Allergen Reactions   Gabapentin  Other (See Comments)    Pt stated she became aggressive and was wanting to fight.    Erythromycin Nausea And Vomiting    Passed out     Objective/Physical Exam Incision is nicely healed.  Muscle strength 5/5 all compartments.  No pain or tenderness with palpation of the posterior tubercle of the calcaneus.  Overall it is a well-healing surgical  foot  Radiographic Exam RT ankle 02/18/2024:  Interval resection of the posterior heel spur noted on lateral view.  Smooth anatomical contour.  Assessment: 1. s/p retrocalcaneal exostectomy with repair of Achilles tendon right. DOS: 02/11/2024   Plan of Care:  -Patient was evaluated. - Okay to transition out of the cam boot into good supportive tennis shoes and sneakers -Recommend light daily stretching exercises.  She declined physical therapy -Return to clinic 8 weeks   Thresa EMERSON Janit, DPM [facility]  Dr. Thresa EMERSON Janit, DPM    [address] [place], [place] [address]                Office [phone]  Fax [phone]

## 2024-04-14 ENCOUNTER — Ambulatory Visit

## 2024-04-18 ENCOUNTER — Other Ambulatory Visit: Admitting: Urology

## 2024-04-19 ENCOUNTER — Telehealth: Payer: Self-pay | Admitting: Urology

## 2024-04-19 NOTE — Telephone Encounter (Signed)
 Patient called office because her CT has been cancelled twice, and can't be rescheduled until insurance pre-authorizes. She said she spoke with her insurance company and was told that they need additional information about what needs to be done. She was given a case number # [id number]. I sent an email to Othel Moats with the referral team to see if she can assist.

## 2024-04-20 ENCOUNTER — Ambulatory Visit

## 2024-04-30 ENCOUNTER — Other Ambulatory Visit: Payer: Self-pay | Admitting: Urology

## 2024-04-30 DIAGNOSIS — R310 Gross hematuria: Secondary | ICD-10-CM

## 2024-05-01 ENCOUNTER — Telehealth: Payer: Self-pay | Admitting: Urology

## 2024-05-01 ENCOUNTER — Encounter: Payer: Self-pay | Admitting: Family Medicine

## 2024-05-01 NOTE — Telephone Encounter (Signed)
 Pt LMOM about having a CT scan done.  I see where she saw Dr Twylla on 10/7 and he ordered CT Scan on 11/16.  IF she needs PA, the other department handles this, right?  Can you forward this message to them that patient called?

## 2024-05-05 ENCOUNTER — Ambulatory Visit
Admission: RE | Admit: 2024-05-05 | Discharge: 2024-05-05 | Disposition: A | Discharge status: Home / Self Care | Source: Ambulatory Visit | Attending: Urology | Admitting: Urology

## 2024-05-05 DIAGNOSIS — R310 Gross hematuria: Secondary | ICD-10-CM

## 2024-05-05 MED ORDER — IOPAMIDOL (ISOVUE-300) INJECTION 61%
100.0000 mL | Freq: Once | INTRAVENOUS | Status: AC | PRN
Start: 2024-05-05 — End: 2024-05-05
  Administered 2024-05-05: 100 mL via INTRAVENOUS

## 2024-05-14 ENCOUNTER — Ambulatory Visit: Payer: Self-pay | Admitting: Urology

## 2024-05-27 ENCOUNTER — Other Ambulatory Visit: Payer: Self-pay | Admitting: Podiatry

## 2024-06-06 ENCOUNTER — Encounter: Payer: Self-pay | Admitting: Podiatry

## 2024-06-06 ENCOUNTER — Ambulatory Visit: Admitting: Podiatry

## 2024-06-06 VITALS — Ht 59.0 in | Wt 209.0 lb

## 2024-06-06 DIAGNOSIS — M7661 Achilles tendinitis, right leg: Secondary | ICD-10-CM | POA: Diagnosis not present

## 2024-06-06 NOTE — Progress Notes (Signed)
" ° °  Chief Complaint  Patient presents with   Foot Pain    Pt is here due to right foot pain, states the pain is in the heel, yesterday the pain was so bad she could not walk on it today it's better.   Subjective:  Patient presents today status post posterior heel spur resection with repair of Achilles tendon right lower extremity.  DOS: 02/11/2024.    Past Medical History:  Diagnosis Date   Anxiety    Arthritis    Depression    Dizziness    GERD (gastroesophageal reflux disease)    Heel spur, right    Hyperlipidemia    Hypertension    Hypothyroidism    Morbid obesity with body mass index (BMI) of 40.0 or higher (HCC)    PONV (postoperative nausea and vomiting)    Prediabetes    Right Achilles tendinitis 2025   Vitamin D  deficiency     Past Surgical History:  Procedure Laterality Date   ABDOMINAL HYSTERECTOMY     ACHILLES TENDON REPAIR Left 2024   ACHILLES TENDON SURGERY Right 02/11/2024   Procedure: REPAIR, TENDON, ACHILLES;  Surgeon: Janit Thresa HERO, DPM;  Location: [facility];  Service: Orthopedics/Podiatry;  Laterality: Right;   APPENDECTOMY     BLADDER SURGERY     CALCANEAL OSTEOTOMY Right 02/11/2024   Procedure: OSTEOTOMY, CALCANEUS;  Surgeon: Janit Thresa HERO, DPM;  Location: [facility];  Service: Orthopedics/Podiatry;  Laterality: Right;  POSTERIOR, PRONE POSTITION   CARPAL TUNNEL RELEASE     Both   CHOLECYSTECTOMY     KNEE SURGERY Left x's two   RECONSTRUCTION OF NOSE Right 2010   TONSILLECTOMY      Allergies  Allergen Reactions   Gabapentin  Other (See Comments)    Pt stated she became aggressive and was wanting to fight.    Erythromycin Nausea And Vomiting    Passed out     Objective/Physical Exam Incision is nicely healed.  Muscle strength 5/5 all compartments.  No pain or tenderness with palpation of the posterior tubercle of the calcaneus.  Overall it is a well-healing surgical foot  Radiographic Exam RT ankle 02/18/2024:  Interval resection of the posterior  heel spur noted on lateral view.  Smooth anatomical contour.  Assessment: 1. s/p retrocalcaneal exostectomy with repair of Achilles tendon right. DOS: 02/11/2024   Plan of Care:  -Patient was evaluated. -Okay for full activity with no restrictions -OTC prefabricated power step arch supports were provided to support the medial longitudinal arch of the foot -Return to clinic PRN   Thresa EMERSON Janit, DPM [facility]  Dr. Thresa EMERSON Janit, DPM    [address] [place], [place] [address]                Office [phone]  Fax [phone]      "

## 2024-06-20 ENCOUNTER — Other Ambulatory Visit: Admitting: Urology

## 2024-06-27 ENCOUNTER — Other Ambulatory Visit: Admitting: Urology

## 2024-07-05 ENCOUNTER — Encounter: Payer: Self-pay | Admitting: Urology

## 2024-07-05 ENCOUNTER — Ambulatory Visit: Admitting: Urology

## 2024-07-05 VITALS — BP 138/72 | HR 74 | Ht 59.0 in | Wt 209.0 lb

## 2024-07-05 DIAGNOSIS — R310 Gross hematuria: Secondary | ICD-10-CM

## 2024-07-05 LAB — MICROSCOPIC EXAMINATION: Bacteria, UA: NONE SEEN

## 2024-07-05 LAB — URINALYSIS, COMPLETE
Bilirubin, UA: NEGATIVE
Glucose, UA: NEGATIVE
Leukocytes,UA: NEGATIVE
Nitrite, UA: NEGATIVE
Protein,UA: NEGATIVE
RBC, UA: NEGATIVE
Specific Gravity, UA: 1.03 (ref 1.005–1.030)
Urobilinogen, Ur: 0.2 mg/dL (ref 0.2–1.0)
pH, UA: 5.5 (ref 5.0–7.5)

## 2024-07-05 NOTE — Progress Notes (Signed)
" ° °  07/05/24  CC:  Chief Complaint  Patient presents with   Cysto    HPI: Refer to my prior note 03/21/2024.  CT urogram performed 05/05/2024 shows multiple left renal parapelvic cysts and a Bosniak 2 left renal cyst.  UA today with negative microscopy  Blood pressure 138/72, pulse 74, height 4' 11 (1.499 m), weight 209 lb (94.8 kg). NED. A&Ox3.   No respiratory distress   Abd soft, NT, ND Normal external genitalia with patent urethral meatus  Cystoscopy Procedure Note  Patient identification was confirmed, informed consent was obtained, and patient was prepped using Betadine solution.  Lidocaine  jelly was administered per urethral meatus.    Procedure: - Flexible cystoscope introduced, without any difficulty.   - Thorough search of the bladder revealed:    normal urethral meatus    normal urothelium    no stones    no ulcers     no tumors    no urethral polyps    no trabeculation  - Ureteral orifices were normal in position and appearance.  Post-Procedure: - Patient tolerated the procedure well  Assessment/ Plan: Benign renal cysts No lower tract abnormalities identified on cystoscopy Gross hematuria was most likely secondary to urinary tract infection Follow-up prn   Glendia JAYSON Barba, MD "

## 2024-08-04 ENCOUNTER — Ambulatory Visit: Admitting: Family Medicine

## 2024-08-04 ENCOUNTER — Encounter: Admitting: Physician Assistant
